# Patient Record
Sex: Male | Born: 1950 | Race: Black or African American | Hispanic: No | Smoking: Current every day smoker
Health system: Southern US, Community
[De-identification: ages and names within clinical notes are randomized; demographics above are authoritative.]

## PROBLEM LIST (undated history)

## (undated) DIAGNOSIS — B182 Chronic viral hepatitis C: Secondary | ICD-10-CM

## (undated) DIAGNOSIS — B20 Human immunodeficiency virus [HIV] disease: Secondary | ICD-10-CM

## (undated) DIAGNOSIS — J449 Chronic obstructive pulmonary disease, unspecified: Secondary | ICD-10-CM

## (undated) DIAGNOSIS — I10 Essential (primary) hypertension: Secondary | ICD-10-CM

## (undated) DIAGNOSIS — J45909 Unspecified asthma, uncomplicated: Secondary | ICD-10-CM

---

## 2002-01-07 ENCOUNTER — Other Ambulatory Visit: Admission: RE | Admit: 2002-01-07 | Discharge: 2002-01-07 | Payer: Self-pay | Admitting: Infectious Diseases

## 2002-07-03 ENCOUNTER — Other Ambulatory Visit: Admission: RE | Admit: 2002-07-03 | Discharge: 2002-07-03 | Payer: Self-pay | Admitting: Internal Medicine

## 2005-06-01 ENCOUNTER — Ambulatory Visit: Payer: Self-pay | Admitting: Internal Medicine

## 2006-02-01 ENCOUNTER — Ambulatory Visit: Payer: Self-pay | Admitting: Infectious Diseases

## 2006-05-02 ENCOUNTER — Ambulatory Visit: Payer: Self-pay | Admitting: Internal Medicine

## 2007-10-16 ENCOUNTER — Ambulatory Visit: Payer: Self-pay | Admitting: Infectious Diseases

## 2008-01-01 ENCOUNTER — Ambulatory Visit: Payer: Self-pay | Admitting: Infectious Diseases

## 2008-04-01 ENCOUNTER — Ambulatory Visit: Payer: Self-pay | Admitting: Internal Medicine

## 2008-10-21 ENCOUNTER — Ambulatory Visit: Payer: Self-pay | Admitting: Internal Medicine

## 2009-02-17 ENCOUNTER — Ambulatory Visit: Payer: Self-pay | Admitting: Infectious Diseases

## 2009-05-05 ENCOUNTER — Ambulatory Visit: Payer: Self-pay | Admitting: Infectious Diseases

## 2009-09-01 ENCOUNTER — Ambulatory Visit: Payer: Self-pay | Admitting: Infectious Disease

## 2009-10-20 ENCOUNTER — Ambulatory Visit: Payer: Self-pay | Admitting: Internal Medicine

## 2010-02-02 ENCOUNTER — Ambulatory Visit: Payer: Self-pay | Admitting: Infectious Diseases

## 2010-05-04 ENCOUNTER — Ambulatory Visit: Payer: Self-pay | Admitting: Infectious Disease

## 2011-02-08 DIAGNOSIS — B20 Human immunodeficiency virus [HIV] disease: Secondary | ICD-10-CM

## 2011-02-08 DIAGNOSIS — K089 Disorder of teeth and supporting structures, unspecified: Secondary | ICD-10-CM

## 2011-02-08 DIAGNOSIS — F17200 Nicotine dependence, unspecified, uncomplicated: Secondary | ICD-10-CM

## 2011-02-08 DIAGNOSIS — J449 Chronic obstructive pulmonary disease, unspecified: Secondary | ICD-10-CM

## 2011-07-26 DIAGNOSIS — B20 Human immunodeficiency virus [HIV] disease: Secondary | ICD-10-CM

## 2011-12-18 DIAGNOSIS — B20 Human immunodeficiency virus [HIV] disease: Secondary | ICD-10-CM

## 2012-04-21 DIAGNOSIS — B20 Human immunodeficiency virus [HIV] disease: Secondary | ICD-10-CM

## 2012-06-19 DIAGNOSIS — B20 Human immunodeficiency virus [HIV] disease: Secondary | ICD-10-CM

## 2012-09-10 DIAGNOSIS — B20 Human immunodeficiency virus [HIV] disease: Secondary | ICD-10-CM

## 2013-03-06 DIAGNOSIS — B20 Human immunodeficiency virus [HIV] disease: Secondary | ICD-10-CM

## 2013-06-16 DIAGNOSIS — B20 Human immunodeficiency virus [HIV] disease: Secondary | ICD-10-CM

## 2014-10-13 DIAGNOSIS — B20 Human immunodeficiency virus [HIV] disease: Secondary | ICD-10-CM

## 2014-11-05 DIAGNOSIS — B20 Human immunodeficiency virus [HIV] disease: Secondary | ICD-10-CM

## 2016-08-31 ENCOUNTER — Encounter: Payer: Self-pay | Admitting: Psychiatry

## 2016-08-31 ENCOUNTER — Inpatient Hospital Stay
Admission: EM | Admit: 2016-08-31 | Discharge: 2016-09-04 | DRG: 885 | Disposition: A | Discharge status: Home / Self Care | Payer: Medicare Other | Source: Intra-hospital | Attending: Psychiatry | Admitting: Psychiatry

## 2016-08-31 DIAGNOSIS — Z91128 Patient's intentional underdosing of medication regimen for other reason: Secondary | ICD-10-CM

## 2016-08-31 DIAGNOSIS — Z9119 Patient's noncompliance with other medical treatment and regimen: Secondary | ICD-10-CM | POA: Diagnosis not present

## 2016-08-31 DIAGNOSIS — K649 Unspecified hemorrhoids: Secondary | ICD-10-CM | POA: Diagnosis present

## 2016-08-31 DIAGNOSIS — R45851 Suicidal ideations: Secondary | ICD-10-CM

## 2016-08-31 DIAGNOSIS — G4700 Insomnia, unspecified: Secondary | ICD-10-CM | POA: Diagnosis present

## 2016-08-31 DIAGNOSIS — F17210 Nicotine dependence, cigarettes, uncomplicated: Secondary | ICD-10-CM | POA: Diagnosis present

## 2016-08-31 DIAGNOSIS — Z888 Allergy status to other drugs, medicaments and biological substances status: Secondary | ICD-10-CM | POA: Diagnosis not present

## 2016-08-31 DIAGNOSIS — J449 Chronic obstructive pulmonary disease, unspecified: Secondary | ICD-10-CM | POA: Diagnosis present

## 2016-08-31 DIAGNOSIS — B20 Human immunodeficiency virus [HIV] disease: Secondary | ICD-10-CM | POA: Diagnosis present

## 2016-08-31 DIAGNOSIS — I10 Essential (primary) hypertension: Secondary | ICD-10-CM | POA: Diagnosis present

## 2016-08-31 DIAGNOSIS — F332 Major depressive disorder, recurrent severe without psychotic features: Principal | ICD-10-CM | POA: Diagnosis present

## 2016-08-31 DIAGNOSIS — R4587 Impulsiveness: Secondary | ICD-10-CM | POA: Diagnosis present

## 2016-08-31 DIAGNOSIS — R634 Abnormal weight loss: Secondary | ICD-10-CM | POA: Diagnosis present

## 2016-08-31 DIAGNOSIS — Z653 Problems related to other legal circumstances: Secondary | ICD-10-CM

## 2016-08-31 DIAGNOSIS — B1910 Unspecified viral hepatitis B without hepatic coma: Secondary | ICD-10-CM | POA: Diagnosis present

## 2016-08-31 DIAGNOSIS — F17200 Nicotine dependence, unspecified, uncomplicated: Secondary | ICD-10-CM | POA: Diagnosis present

## 2016-08-31 DIAGNOSIS — Z21 Asymptomatic human immunodeficiency virus [HIV] infection status: Secondary | ICD-10-CM | POA: Diagnosis present

## 2016-08-31 DIAGNOSIS — F1420 Cocaine dependence, uncomplicated: Secondary | ICD-10-CM | POA: Diagnosis present

## 2016-08-31 DIAGNOSIS — B1920 Unspecified viral hepatitis C without hepatic coma: Secondary | ICD-10-CM

## 2016-08-31 DIAGNOSIS — B182 Chronic viral hepatitis C: Secondary | ICD-10-CM | POA: Diagnosis present

## 2016-08-31 DIAGNOSIS — Z91199 Patient's noncompliance with other medical treatment and regimen due to unspecified reason: Secondary | ICD-10-CM

## 2016-08-31 DIAGNOSIS — F1020 Alcohol dependence, uncomplicated: Secondary | ICD-10-CM | POA: Diagnosis present

## 2016-08-31 DIAGNOSIS — Z882 Allergy status to sulfonamides status: Secondary | ICD-10-CM | POA: Diagnosis not present

## 2016-08-31 HISTORY — DX: Chronic obstructive pulmonary disease, unspecified: J44.9

## 2016-08-31 HISTORY — DX: Human immunodeficiency virus (HIV) disease: B20

## 2016-08-31 HISTORY — DX: Chronic viral hepatitis C: B18.2

## 2016-08-31 HISTORY — DX: Essential (primary) hypertension: I10

## 2016-08-31 HISTORY — DX: Unspecified asthma, uncomplicated: J45.909

## 2016-08-31 MED ORDER — ALUM & MAG HYDROXIDE-SIMETH 200-200-20 MG/5ML PO SUSP: 30.0000 mL | ORAL | Status: DC | PRN

## 2016-08-31 MED ORDER — IBUPROFEN 600 MG PO TABS: 600.0000 mg | ORAL_TABLET | Freq: Four times a day (QID) | ORAL | Status: DC | PRN

## 2016-08-31 MED ORDER — ACETAMINOPHEN 325 MG PO TABS: 650.0000 mg | ORAL_TABLET | Freq: Four times a day (QID) | ORAL | Status: DC | PRN

## 2016-08-31 MED ORDER — HYDROXYZINE HCL 25 MG PO TABS: 25.0000 mg | ORAL_TABLET | Freq: Three times a day (TID) | ORAL | Status: DC | PRN

## 2016-08-31 MED ORDER — MAGNESIUM HYDROXIDE 400 MG/5ML PO SUSP: 30.0000 mL | Freq: Every day | ORAL | Status: DC | PRN

## 2016-08-31 MED ORDER — TRAZODONE HCL 100 MG PO TABS
100.0000 mg | ORAL_TABLET | Freq: Every day | ORAL | Status: DC
  Administered 2016-08-31: 100 mg via ORAL
  Filled 2016-08-31: qty 1

## 2016-08-31 MED ORDER — NICOTINE 21 MG/24HR TD PT24: 21.0000 mg | MEDICATED_PATCH | Freq: Every day | TRANSDERMAL | Status: DC

## 2016-08-31 MED ORDER — ALBUTEROL SULFATE HFA 108 (90 BASE) MCG/ACT IN AERS
1.0000 | INHALATION_SPRAY | Freq: Four times a day (QID) | RESPIRATORY_TRACT | Status: DC | PRN
  Filled 2016-08-31: qty 6.7

## 2016-08-31 MED ORDER — HYDROCORTISONE ACETATE 25 MG RE SUPP
25.0000 mg | Freq: Two times a day (BID) | RECTAL | Status: DC
  Administered 2016-09-02: 25 mg via RECTAL
  Filled 2016-08-31 (×3): qty 1

## 2016-08-31 NOTE — Tx Team (Signed)
Initial Treatment Plan 08/31/2016 4:11 PM Henry BorosJulius B Oconnell BJY:[id number]    PATIENT STRESSORS: Financial difficulties Health problems Medication change or noncompliance   PATIENT STRENGTHS: Capable of independent living Supportive family/friends   PATIENT IDENTIFIED PROBLEMS: Depression       Non compliant with meds for medical condition                DISCHARGE CRITERIA:  Ability to meet basic life and health needs Improved stabilization in mood, thinking, and/or behavior Need for constant or close observation no longer present  PRELIMINARY DISCHARGE PLAN: Attend aftercare/continuing care group Return to previous living arrangement  PATIENT/FAMILY INVOLVEMENT: This treatment plan has been presented to and reviewed with the patient, Henry Oconnell, and/or family member, .  The patient and family have been given the opportunity to ask questions and make suggestions.  Ignacia FellingJennifer A Nyron Mozer, RN 08/31/2016, 4:11 PM

## 2016-08-31 NOTE — BHH Group Notes (Signed)
B[facility] Group Notes:  (Nursing/MHT/Case Management/Adjunct)  Date:  08/31/2016  Time:  5:06 PM  Type of Therapy:  Group Therapy  Participation Level:  Did Not Attend   Annella Prowell De'Chelle Yehia Mcbain 08/31/2016, 5:06 PM 

## 2016-08-31 NOTE — BH Assessment (Signed)
Patient has been [facility].  Accepting physician is Dr. Jennet MaduroPucilowska.  Attending Physician will be Dr. Jennet MaduroPucilowska.  Patient has been [facility] to room 303, [facility] Charge Nurse [place] .  Call report to [phone].  Representative/Transfer Coordinator is Warden/rangerCalvin Patient pre-admitted [facility] Patient Access Byrd Hesselbach(Maria)   [facility] Staff [facility](Meghan, TTS) made aware of acceptance.

## 2016-08-31 NOTE — BH Assessment (Signed)
Writer contacted [facility](Lisa Hampton-[phone]) about the medications that transferred with patient, in error. They are sending their carrier service, [employer] to pick them up.
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
## 2016-09-01 DIAGNOSIS — F1420 Cocaine dependence, uncomplicated: Secondary | ICD-10-CM

## 2016-09-01 LAB — COMPREHENSIVE METABOLIC PANEL WITH GFR
ALT: 51 U/L (ref 17–63)
AST: 77 U/L — ABNORMAL HIGH (ref 15–41)
Albumin: 3.1 g/dL — ABNORMAL LOW (ref 3.5–5.0)
Alkaline Phosphatase: 63 U/L (ref 38–126)
Anion gap: 4 — ABNORMAL LOW (ref 5–15)
BUN: 9 mg/dL (ref 6–20)
CO2: 29 mmol/L (ref 22–32)
Calcium: 8.6 mg/dL — ABNORMAL LOW (ref 8.9–10.3)
Chloride: 105 mmol/L (ref 101–111)
Creatinine, Ser: 0.91 mg/dL (ref 0.61–1.24)
GFR calc Af Amer: >60 mL/min
GFR calc non Af Amer: >60 mL/min
Glucose, Bld: 90 mg/dL (ref 65–99)
Potassium: 3.8 mmol/L (ref 3.5–5.1)
Sodium: 138 mmol/L (ref 135–145)
Total Bilirubin: 0.7 mg/dL (ref 0.3–1.2)
Total Protein: 8.1 g/dL (ref 6.5–8.1)

## 2016-09-01 LAB — CBC
HCT: 36.1 % — ABNORMAL LOW (ref 40.0–52.0)
Hemoglobin: 12.5 g/dL — ABNORMAL LOW (ref 13.0–18.0)
MCH: 33.1 pg (ref 26.0–34.0)
MCHC: 34.7 g/dL (ref 32.0–36.0)
MCV: 95.5 fL (ref 80.0–100.0)
Platelets: 104 K/uL — ABNORMAL LOW (ref 150–440)
RBC: 3.78 MIL/uL — ABNORMAL LOW (ref 4.40–5.90)
RDW: 16.1 % — ABNORMAL HIGH (ref 11.5–14.5)
WBC: 2.8 K/uL — ABNORMAL LOW (ref 3.8–10.6)

## 2016-09-01 LAB — LIPID PANEL
Cholesterol: 175 mg/dL (ref 0–200)
HDL: 40 mg/dL — ABNORMAL LOW
LDL Cholesterol: 120 mg/dL — ABNORMAL HIGH (ref 0–99)
Total CHOL/HDL Ratio: 4.4 ratio
Triglycerides: 73 mg/dL
VLDL: 15 mg/dL (ref 0–40)

## 2016-09-01 LAB — TSH: TSH: 1.208 u[IU]/mL (ref 0.350–4.500)

## 2016-09-01 MED ORDER — SERTRALINE HCL 25 MG PO TABS
25.0000 mg | ORAL_TABLET | Freq: Every day | ORAL | Status: DC
  Administered 2016-09-01 – 2016-09-03 (×3): 25 mg via ORAL
  Filled 2016-09-01 (×3): qty 1

## 2016-09-01 MED ORDER — ADULT MULTIVITAMIN W/MINERALS CH
1.0000 | ORAL_TABLET | Freq: Every day | ORAL | Status: DC
  Administered 2016-09-01 – 2016-09-04 (×4): 1 via ORAL
  Filled 2016-09-01 (×4): qty 1

## 2016-09-01 MED ORDER — ADULT MULTIVITAMIN W/MINERALS CH: 1.0000 | ORAL_TABLET | Freq: Every day | ORAL | Status: DC

## 2016-09-01 MED ORDER — MIRTAZAPINE 15 MG PO TABS
15.0000 mg | ORAL_TABLET | Freq: Every day | ORAL | Status: DC
  Administered 2016-09-01 – 2016-09-03 (×3): 15 mg via ORAL
  Filled 2016-09-01 (×3): qty 1

## 2016-09-01 MED ORDER — DRONABINOL 2.5 MG PO CAPS
2.5000 mg | ORAL_CAPSULE | Freq: Every day | ORAL | Status: DC
  Administered 2016-09-01 – 2016-09-03 (×3): 2.5 mg via ORAL
  Filled 2016-09-01 (×3): qty 1

## 2016-09-01 MED ORDER — ELVITEG-COBIC-EMTRICIT-TENOFAF 150-150-200-10 MG PO TABS
1.0000 | ORAL_TABLET | Freq: Every day | ORAL | Status: DC
  Administered 2016-09-01 – 2016-09-04 (×4): 1 via ORAL
  Filled 2016-09-01 (×4): qty 1

## 2016-09-01 MED ORDER — ENSURE ENLIVE PO LIQD
237.0000 mL | Freq: Three times a day (TID) | ORAL | Status: DC
  Administered 2016-09-01 – 2016-09-04 (×10): 237 mL via ORAL

## 2016-09-01 NOTE — Progress Notes (Signed)
Pt denies SI/HI/AVH at this time. Affect depressed. Minimal interaction with staff or peers. Denies pain. Voices no additional concerns at this time. Safety maintained. Will continue to monitor.

## 2016-09-01 NOTE — Plan of Care (Signed)
Problem: Activity: Goal: Interest or engagement in activities will improve Outcome: Not Progressing Minimal interaction with staff or peers.

## 2016-09-01 NOTE — H&P (Signed)
Psychiatric Admission Assessment Adult  Patient Identification: Henry Oconnell MRN:  [id number] Date of Evaluation:  09/01/2016 Chief Complaint:  Depression Principal Diagnosis: Major depressive disorder, recurrent severe without psychotic features ([place]) Diagnosis:   Patient Active Problem List   Diagnosis Date Noted  . Cocaine use disorder, moderate, dependence ([place]) [F14.20] 09/01/2016  . Major depressive disorder, recurrent severe without psychotic features ([place]) [F33.2] 08/31/2016  . Alcohol use disorder, moderate, dependence ([place]) [F10.20] 08/31/2016  . Suicidal ideation [R45.851] 08/31/2016  . Noncompliance [Z91.19] 08/31/2016  . Tobacco use disorder [F17.200] 08/31/2016  . Human immunodeficiency virus (HIV) disease ([place]) [B20] 08/31/2016  . Hepatitis C [B19.20] 08/31/2016  . Hepatitis B [B19.10] 08/31/2016   History of Present Illness:  Patient is a 65 year old single African-American male from [place]. He was transferred from [facility] emergency department to our unit for further stabilization and treatment of worsening depression.  Patient states that he went voluntarily to [facility] emergency department on December 7 requesting help for suicidal ideation. States that he was having thoughts about killing himself anyway he could. Patient says that his main stressor is his health condition. Patient states he's been diagnosed with HIV since the 90s. He says that for the last couple weeks he has not been taking medications because he just didn't feel like it.  Patient also has legal stressors. He was just released from prison in August. He is currently on probation.  Patient described his mood as irritable, denies problems with sleep, appetite, energy or concentration. He denies any hallucinations or homicidal ideation.  As far as substance abuse patient says he relapsed on cocaine/crack about a week ago. He had a past history of cocaine but had been sober since  August. He also has a history of alcohol dependence. Says his only been drinking about 4 beers per week. Patient says that while in prison he completed the DART program. Patient smokes about one pack of cigarettes per week.    Associated Signs/Symptoms: Depression Symptoms:  depressed mood, hopelessness, recurrent thoughts of death, suicidal thoughts without plan, (Hypo) Manic Symptoms:  Impulsivity, Anxiety Symptoms:  Excessive Worry, Psychotic Symptoms:  denies PTSD Symptoms: Negative Total Time spent with patient: 1 hour  Past Psychiatric History: [place] and [place]icidality. He denies having any prior suicidal attempts but says that in the past he has tried to drink himself today and overdosed on cocaine.  Is the patient at risk to self? Yes.    Has the patient been a risk to self in the past 6 months? No.  Has the patient been a risk to self within the distant past? No.  Is the patient a risk to others? No.  Has the patient been a risk to others in the past 6 months? No.  Has the patient been a risk to others within the distant past? No.   Alcohol Screening: 1. How often do you have a drink containing alcohol?: Monthly or less 2. How many drinks containing alcohol do you have on a typical day when you are drinking?: 3 or 4 3. How often do you have six or more drinks on one occasion?: Less than monthly Preliminary Score: 2 4. How often during the last year have you found that you were not able to stop drinking once you had started?: Never 5. How often during the last year have you failed to do what was normally expected from  you becasue of drinking?: Never 6. How often during the last year have you needed a first drink in the morning to get yourself going after a heavy drinking session?: Never 7. How often during the last year have you had a feeling of guilt of remorse after  drinking?: Never 8. How often during the last year have you been unable to remember what happened the night before because you had been drinking?: Never 9. Have you or someone else been injured as a result of your drinking?: No 10. Has a relative or friend or a doctor or another health worker been concerned about your drinking or suggested you cut down?: Yes, but not in the last year Alcohol Use Disorder Identification Test Final Score (AUDIT): 5 Brief Intervention: AUDIT score less than 7 or less-screening does not suggest unhealthy drinking-brief intervention not indicated  Past Medical History: Patient sees an infectious disease doctor in [facility] for his HIV. He denies any history of head trauma or seizures  Past Medical History:  Diagnosis Date  . Asthma   . COPD (chronic obstructive pulmonary disease) ([place])   . Hep C w/ coma, chronic ([place])   . HIV (human immunodeficiency virus infection) ([place])   . Hypertension    History reviewed. No pertinent surgical history.  Family History: History reviewed. No pertinent family history.  Family Psychiatric  History: He denies any family history of mental illness, substance abuse or suicide  Tobacco Screening: Have you used any form of tobacco in the last 30 days? (Cigarettes, Smokeless Tobacco, Cigars, and/or Pipes): Yes Tobacco use, Select all that apply: 4 or less cigarettes per day Are you interested in Tobacco Cessation Medications?: Yes, will notify MD for an order Counseled patient on smoking cessation including recognizing danger situations, developing coping skills and basic information about quitting provided: Refused/Declined practical counseling  Social History: Patient is currently living with his brother in [place]. He is single, never married, has 2 children ages  As far as his legal history he had charges for breaking and entering. He violated probation and went to prison for 15 months. He was just released this past  August. He continues to be on probation  History  Alcohol Use  . 2.4 oz/week  . 4 Cans of beer per week     History  Drug Use  . Types: Cocaine     Allergies:   Allergies  Allergen Reactions  . Sulfamethoxazole Micro [Sulfamethoxazole] Rash  . Trimethoprim Rash   Lab Results:  Results for orders placed or performed during the hospital encounter of 08/31/16 (from the past 48 hour(s))  CBC     Status: Abnormal   Collection Time: 09/01/16  7:25 AM  Result Value Ref Range   WBC 2.8 (L) 3.8 - 10.6 K/uL   RBC 3.78 (L) 4.40 - 5.90 MIL/uL   Hemoglobin 12.5 (L) 13.0 - 18.0 g/dL   HCT 36.1 (L) 40.0 - 52.0 %   MCV 95.5 80.0 - 100.0 fL   MCH 33.1 26.0 - 34.0 pg   MCHC 34.7 32.0 - 36.0 g/dL   RDW 16.1 (H) 11.5 - 14.5 %   Platelets 104 (L) 150 - 440 K/uL  Comprehensive metabolic panel     Status: Abnormal   Collection Time: 09/01/16  7:25 AM  Result Value Ref Range   Sodium 138 135 - 145 mmol/L   Potassium 3.8 3.5 - 5.1 mmol/L   Chloride 105 101 - 111 mmol/L   CO2 29 22 - 32  mmol/L   Glucose, Bld 90 65 - 99 mg/dL   BUN 9 6 - 20 mg/dL   Creatinine, Ser 0.91 0.61 - 1.24 mg/dL   Calcium 8.6 (L) 8.9 - 10.3 mg/dL   Total Protein 8.1 6.5 - 8.1 g/dL   Albumin 3.1 (L) 3.5 - 5.0 g/dL   AST 77 (H) 15 - 41 U/L   ALT 51 17 - 63 U/L   Alkaline Phosphatase 63 38 - 126 U/L   Total Bilirubin 0.7 0.3 - 1.2 mg/dL   GFR calc non Af Amer >60 >60 mL/min   GFR calc Af Amer >60 >60 mL/min    Comment: (NOTE) The eGFR has been calculated using the CKD EPI equation. This calculation has not been validated in all clinical situations. eGFR's persistently <60 mL/min signify possible Chronic Kidney Disease.    Anion gap 4 (L) 5 - 15  TSH     Status: None   Collection Time: 09/01/16  7:25 AM  Result Value Ref Range   TSH 1.208 0.350 - 4.500 uIU/mL    Comment: Performed by a 3rd Generation assay with a functional sensitivity of <=0.01 uIU/mL.  Lipid panel     Status: Abnormal   Collection Time:  09/01/16  7:25 AM  Result Value Ref Range   Cholesterol 175 0 - 200 mg/dL   Triglycerides 73 <150 mg/dL   HDL 40 (L) >40 mg/dL   Total CHOL/HDL Ratio 4.4 RATIO   VLDL 15 0 - 40 mg/dL   LDL Cholesterol 120 (H) 0 - 99 mg/dL    Comment:        Total Cholesterol/HDL:CHD Risk Coronary Heart Disease Risk Table                     Men   Women  1/2 Average Risk   3.4   3.3  Average Risk       5.0   4.4  2 X Average Risk   9.6   7.1  3 X Average Risk  23.4   11.0        Use the calculated Patient Ratio above and the CHD Risk Table to determine the patient's CHD Risk.        ATP III CLASSIFICATION (LDL):  <100     mg/dL   Optimal  100-129  mg/dL   Near or Above                    Optimal  130-159  mg/dL   Borderline  160-189  mg/dL   High  >190     mg/dL   Very High     Blood Alcohol level:  No results found for: [facility]  Metabolic Disorder Labs:  No results found for: HGBA1C, MPG No results found for: PROLACTIN Lab Results  Component Value Date   CHOL 175 09/01/2016   TRIG 73 09/01/2016   HDL 40 (L) 09/01/2016   CHOLHDL 4.4 09/01/2016   VLDL 15 09/01/2016   LDLCALC 120 (H) 09/01/2016    Current Medications: Current Facility-Administered Medications  Medication Dose Route Frequency Provider Last Rate Last Dose  . acetaminophen (TYLENOL) tablet 650 mg  650 mg Oral Q6H PRN Jolanta B Pucilowska, MD      . albuterol (PROVENTIL HFA;VENTOLIN HFA) 108 (90 Base) MCG/ACT inhaler 1-2 puff  1-2 puff Inhalation Q6H PRN Jolanta B Pucilowska, MD      . alum & mag hydroxide-simeth (MAALOX/MYLANTA) 200-200-20 MG/5ML suspension 30 mL  30  mL Oral Q4H PRN Clovis Fredrickson, MD      . hydrocortisone (ANUSOL-HC) suppository 25 mg  25 mg Rectal BID Jolanta B Pucilowska, MD      . hydrOXYzine (ATARAX/VISTARIL) tablet 25 mg  25 mg Oral TID PRN Jolanta B Pucilowska, MD      . ibuprofen (ADVIL,MOTRIN) tablet 600 mg  600 mg Oral Q6H PRN Jolanta B Pucilowska, MD      . magnesium hydroxide (MILK OF  MAGNESIA) suspension 30 mL  30 mL Oral Daily PRN Jolanta B Pucilowska, MD      . nicotine (NICODERM CQ - dosed in mg/24 hours) patch 21 mg  21 mg Transdermal Q0600 Jolanta B Pucilowska, MD      . sertraline (ZOLOFT) tablet 25 mg  25 mg Oral Daily Hildred Priest, MD      . traZODone (DESYREL) tablet 100 mg  100 mg Oral QHS Jolanta B Pucilowska, MD   100 mg at 08/31/16 2150   PTA Medications: No prescriptions prior to admission.    Musculoskeletal: Strength & Muscle Tone: within normal limits Gait & Station: normal Patient leans: N/A  Psychiatric Specialty Exam: Physical Exam  Constitutional: He is oriented to person, place, and time. He appears well-developed and well-nourished.  HENT:  Head: Normocephalic and atraumatic.  Eyes: Conjunctivae and EOM are normal.  Neck: Normal range of motion.  Respiratory: Effort normal.  Musculoskeletal: Normal range of motion.  Neurological: He is alert and oriented to person, place, and time.    Review of Systems  Constitutional: Negative.   HENT: Negative.   Eyes: Negative.   Respiratory: Negative.   Cardiovascular: Negative.   Gastrointestinal: Negative.   Genitourinary: Negative.   Musculoskeletal: Negative.   Skin: Negative.   Neurological: Negative.   Endo/Heme/Allergies: Negative.   Psychiatric/Behavioral: Positive for depression, substance abuse and suicidal ideas. Negative for hallucinations and memory loss. The patient is not nervous/anxious and does not have insomnia.     Blood pressure (!) 100/56, pulse 62, temperature 99 F (37.2 C), temperature source Oral, resp. rate 17, height '5\' 9"'$  (1.753 m), weight 54.4 kg (120 lb), SpO2 100 %.Body mass index is 17.72 kg/m.  General Appearance: Disheveled  Eye Contact:  Fair  Speech:  Clear and Coherent  Volume:  Decreased  Mood:  Dysphoric  Affect:  Blunt  Thought Process:  Linear and Descriptions of Associations: Intact  Orientation:  Full (Time, Place, and Person)   Thought Content:  Hallucinations: None  Suicidal Thoughts:  Yes.  with intent/plan  Homicidal Thoughts:  No  Memory:  Immediate;   Good Recent;   Good Remote;   Good  Judgement:  Poor  Insight:  Shallow  Psychomotor Activity:  Decreased  Concentration:  Concentration: Good and Attention Span: Good  Recall:  Good  Fund of Knowledge:  Good  Language:  Good  Akathisia:  No  Handed:    AIMS (if indicated):     Assets:  Communication Skills Physical Health  ADL's:  Intact  Cognition:  WNL  Sleep:  Number of Hours: 6.45    Treatment Plan Summary: Daily contact with patient to assess and evaluate symptoms and progress in treatment and Medication management  For major depressive disorder the patient will be started on Zoloft 25 mg by mouth daily.  For insomnia I will order mirtazapine 15 mg qhs  For tobacco use disorder the patient declines from receiving a nicotine patch  For cocaine and alcohol use disorder patient will be referred to outpatient  substance abuse treatment upon discharge. There is no evidence of withdrawal at this time.  For HIV he will be continued on Genvoya  Poor appetite: ensure tid, will start on marinol and multivitamins   Labs: Ast 15, alcohol was 120,  + cocaine, RPR neg, EKG sinus brady 58.  UA clear  Dispo: back [place] with brother  F/u: will need psych f/u  Physician Treatment Plan for Primary Diagnosis: Major depressive disorder, recurrent severe without psychotic features ([place]) Long Term Goal(s): Improvement in symptoms so as ready for discharge  Short Term Goals: Ability to identify changes in lifestyle to reduce recurrence of condition will improve, Ability to verbalize feelings will improve, Ability to disclose and discuss suicidal ideas, Ability to demonstrate self-control will improve, Ability to identify and develop effective coping behaviors will improve, Compliance with prescribed medications will improve and Ability to identify  triggers associated with substance abuse/mental health issues will improve  Physician Treatment Plan for Secondary Diagnosis: Principal Problem:   Major depressive disorder, recurrent severe without psychotic features ([place]) Active Problems:   Alcohol use disorder, moderate, dependence ([place])   Suicidal ideation   Noncompliance   Tobacco use disorder   Human immunodeficiency virus (HIV) disease ([place])   Hepatitis C   Hepatitis B   Cocaine use disorder, moderate, dependence ([place])  Long Term Goal(s): Improvement in symptoms so as ready for discharge  Short Term Goals: Ability to identify and develop effective coping behaviors will improve, Compliance with prescribed medications will improve and Ability to identify triggers associated with substance abuse/mental health issues will improve  I certify that inpatient services furnished can reasonably be expected to improve the patient's condition.    Hildred Priest, MD 12/9/201710:06 AM

## 2016-09-01 NOTE — BHH Suicide Risk Assessment (Signed)
[facility]   Nursing information obtained from:    Demographic factors:    Current Mental Status:    Loss Factors:    Historical Factors:    Risk Reduction Factors:     Total Time spent with patient: 1 hour Principal Problem: Major depressive disorder, recurrent severe without psychotic features (H[facility]) Diagnosis:   Patient Active Problem List   Diagnosis Date Noted  . Cocaine use disorder, moderate, dependence (HCC) [F14.20] 09/01/2016  . Major depressive disorder, recurrent severe without psychotic features ([facility]) [F33.2] 08/31/2016  . Alcohol use disorder, moderate, dependence (H[facility]) [F10.20] 08/31/2016  . Suicidal ideation [R45.851] 08/31/2016  . Noncompliance [Z91.19] 08/31/2016  . Tobacco use disorder [F17.200] 08/31/2016  . Human immunodeficiency virus (HIV) disease (H[facility]) [B20] 08/31/2016  . Hepatitis C [B19.20] 08/31/2016  . Hepatitis B [B19.10] 08/31/2016   Subjective Data:   Continued Clinical Symptoms:  Alcohol Use Disorder Identification Test Final Score (AUDIT): 5 The "Alcohol Use Disorders Identification Test", Guidelines for Use in Primary Care, Second Edition.  World Science writerHealth Organization [facility](WHO). Score between 0-7:  no or low risk or alcohol related problems. Score between 8-15:  moderate risk of alcohol related problems. Score between 16-19:  high risk of alcohol related problems. Score 20 or above:  warrants further diagnostic evaluation for alcohol dependence and treatment.   CLINICAL FACTORS:   Depression:   Comorbid alcohol abuse/dependence Hopelessness Impulsivity Severe Alcohol/Substance Abuse/Dependencies Previous Psychiatric Diagnoses and Treatments Medical Diagnoses and Treatments/Surgeries     Psychiatric Specialty Exam: Physical Exam  ROS  Blood pressure (!) 100/56, pulse 62, temperature 99 F (37.2 C), temperature source Oral, resp. rate 17, height 5\' 9"  (1.753 m), weight 54.4 kg (120 lb), SpO2 100 %.Body mass index  is 17.72 kg/m.                                                    Sleep:  Number of Hours: 6.45      COGNITIVE FEATURES THAT CONTRIBUTE TO RISK:  Closed-mindedness    SUICIDE RISK:   Moderate:  Frequent suicidal ideation with limited intensity, and duration, some specificity in terms of plans, no associated intent, good self-control, limited dysphoria/symptomatology, some risk factors present, and identifiable protective factors, including available and accessible social support.   [facility]  I certify that inpatient services furnished can reasonably be expected to improve the patient's condition.  Jimmy FootmanHernandez-Gonzalez,  Ashonte Angelucci, MD 09/01/2016, 10:05 AM

## 2016-09-01 NOTE — [facility] Group Notes (Signed)
[facility] LCSW Group Therapy  09/01/2016 2:10 PM  Type of Therapy:  Group Therapy  Participation Level:  Patient did not attend group. CSW invited patient to group.   Summary of Progress/Problems: Stress management: Patients defined and discussed the topic of stress and the related symptoms and triggers for stress. Patients identified healthy coping skills they would like to try during hospitalization and after discharge to manage stress in a healthy way. CSW offered insight to varying stress management techniques.   Kelaiah Escalona G. Garnette CzechSampson MSW, LCSWA 09/01/2016, 2:11 PM

## 2016-09-01 NOTE — [facility] Counselor (Signed)
Adult Comprehensive Assessment  Patient ID: Henry BorosJulius B Bryngelson, male   DOB: 12/20/1950, 65 y.o.   MRN: [id number]  Information Source: Information source: Patient  Current Stressors:  Educational / Learning stressors: n/a Employment / Job issues: Pt is on disability Family Relationships: n/a Surveyor, quantityinancial / Lack of resources (include bankruptcy): n/a Housing / Lack of housing: n/a Physical health (include injuries & life threatening diseases): Hepatitis B &C, HIV Social relationships: n/a Substance abuse: Alcohol Bereavement / Loss: n/a  Living/Environment/Situation:  Living Arrangements: Other relatives How long has patient lived in current situation?: Since August 2017 What is atmosphere in current home: Comfortable, Supportive  Family History:  Marital status: Separated Separated, when?: Since 2006 What types of issues is patient dealing with in the relationship?: Pt did not want to answer Additional relationship information: n/a Are you sexually active?: No What is your sexual orientation?: heterosexual Has your sexual activity been affected by drugs, alcohol, medication, or emotional stress?: n/a Does patient have children?: Yes How many children?: 2 How is patient's relationship with their children?: 2 girls. Pt states he has a good relationship with them.  Childhood History:  By whom was/is the patient raised?: Both parents Additional childhood history information: n/a Description of patient's relationship with caregiver when they were a child: Pt states they had a good relationship Patient's description of current relationship with people who raised him/her: Both deceased How were you disciplined when you got in trouble as a child/adolescent?: n/a Does patient have siblings?: No Did patient suffer any verbal/emotional/physical/sexual abuse as a child?: No Did patient suffer from severe childhood neglect?: No Has patient ever been sexually abused/assaulted/raped as an  adolescent or adult?: No Was the patient ever a victim of a crime or a disaster?: No Witnessed domestic violence?: No Has patient been effected by domestic violence as an adult?: No  Education:  Highest grade of school patient has completed: 10th Currently a student?: No Learning disability?: No  Employment/Work Situation:   Employment situation: On disability Why is patient on disability: Medical reasons How long has patient been on disability: Since October 2017. 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 Has patient ever been in the [employer] and Companymilitary?: No Has patient ever served in combat?: No Did You Receive Any Psychiatric Treatment/Services While in Equities traderthe Military?: No Are There Guns or Education officer, communityther Weapons in Your Home?: No Are These ComptrollerWeapons Safely Secured?:  (n/a)  Surveyor, quantityinancial Resources:   Surveyor, quantityinancial resources: [employer]ceives SSI, Medicaid, Medicare Does patient have a Lawyerrepresentative payee or guardian?: No  Alcohol/Substance Abuse:   What has been your use of drugs/alcohol within the last 12 months?: Alcohol If attempted suicide, did drugs/alcohol play a role in this?: Yes Alcohol/Substance Abuse Treatment Hx: Attends AA/NA If yes, describe treatment: Pt states he went to [employer] meetings [place] Has alcohol/substance abuse ever caused legal problems?: No  Social Support System:   Conservation officer, natureatient's Community Support System: Good Describe Community Support System: Pt has support from brother and daughters Type of faith/religion: n/a How does patient's faith help to cope with current illness?: n/a  Leisure/Recreation:   Leisure and Hobbies: Engineer, waterWatching tv  Strengths/Needs:   What things does the patient do well?: playing cards, yard work, exercise In what areas does patient struggle / problems  for patient:  depression and alcohol use  Discharge Plan:   Does patient have access to transportation?: No Plan for no access to transportation at discharge: CSW will assess appropriate transportation Will patient be returning to same living situation after discharge?: Yes Currently receiving community mental health services: No If no, would patient like referral for services when discharged?:  [facility]([place] Does patient have financial barriers related to discharge medications?: No  Summary/Recommendations:   Patient is a 65 year old male admitted voluntarily with a diagnosis of Major depressive disorder, recurrent severe without psychotic features and alcohol use disorder, moderate, dependence. Information was obtained from psychosocial assessment completed with patient and chart review conducted by this evaluator. [facility] [place] [facility] [place] [facility] [place] worsening depression . Patient reports primary triggers for admission were having suicidal thoughts and stressors from his health condition. Patient [place] and plans to return there when discharged. Patient will follow-up with [facility] in [place] for outpatient services. Patient is on disability and has [employer]icaid/Medicare insurance. Patient will benefit from crisis stabilization, medication evaluation, group therapy and psycho education in addition to case management for discharge. At discharge, it is recommended that patient remain compliant with established discharge plan and continued treatment.   Keyleigh Manninen G. Garnette CzechSampson MSW, LCSWA 09/01/2016 11:49 AM

## 2016-09-02 LAB — HEMOGLOBIN A1C
Hgb A1c MFr Bld: 5.5 % (ref 4.8–5.6)
MEAN PLASMA GLUCOSE: 111 mg/dL

## 2016-09-02 NOTE — Progress Notes (Signed)
Initial Nutrition Assessment  DOCUMENTATION CODES:   Severe malnutrition in context of chronic illness  INTERVENTION:   Ensure Enlive po TID, each supplement provides 350 kcal and 20 grams of protein  Magic cup TID with meals, each supplement provides 290 kcal and 9 grams of protein  Multivitamin daily  NUTRITION DIAGNOSIS:   Malnutrition related to catabolic illness, poor appetite as evidenced by severe depletion of body fat, severe depletion of muscle mass.  GOAL:   Patient will meet greater than or equal to 90% of their needs  MONITOR:   PO intake, Supplement acceptance  REASON FOR ASSESSMENT:   Consult Assessment of nutrition requirement/status  ASSESSMENT:   65 year old single African-American male from [place]. He was transferred from [facility] emergency department to our unit for further stabilization and treatment of worsening depression. Pt has h/o HIV, alcohol and cocaine abuse, Hepatitis B, and Hepatitis C   Met with pt in room today. Pt reports improving appetite and eating 50-85% meals. Pt reports that his appetite varied pta and sometimes its good and sometimes its poor. Pt reports that he usually weighs ~135lbs but has lost weight over the past couple of months; this would be an 11% body weight loss over a couple of months and would be considered significant. There is no wt history in chart for this pt. Pt denies any N/V/D today. Pt reports that he is drinking 100% of his Ensures.    Medications reviewed and include: hydrocortisone, MVI, nicotine, mirtazapine, zoloft  Labs reviewed: Ca 8.6(L) adj. 9.32 wnl, Alb. 3.1(L), AST 77(H) WBC- 2.8(L)  Nutrition-Focused physical exam completed. Findings are severe fat depletion, severe muscle depletion, and no edema.   Diet Order:  Diet regular Room service appropriate? Yes; Fluid consistency: Thin  Skin:  Reviewed, no issues  Last BM:  none since admit  Height:   Ht Readings from Last 1  Encounters:  08/31/16 _0  (1.753 m)    Weight:   Wt Readings from Last 1 Encounters:  08/31/16 120 lb (54.4 kg)    Ideal Body Weight:  72.7 kg  BMI:  Body mass index is 17.72 kg/m.  Estimated Nutritional Needs:   Kcal:  1650-1900kcal/day   Protein:  82-92g/day   Fluid:  >1.5L/day   EDUCATION NEEDS:   No education needs identified at this time  Koleen Distance, RD, LDN

## 2016-09-02 NOTE — Progress Notes (Signed)
Pt denies SI/HI/AVH at this time. Affect depressed. Minimal interaction with staff or peers. Denies pain. Medication compliant. Appropriate during interaction.  Voices no additional concerns at this time. Safety maintained. Will continue to monitor

## 2016-09-02 NOTE — Plan of Care (Signed)
Problem: Activity: Goal: Imbalance in normal sleep/wake cycle will improve Outcome: Progressing Pt has been sleeping 6 or more hrs. Per night, uninterrupted.

## 2016-09-02 NOTE — Progress Notes (Signed)
[facility] MD Progress Note  09/02/2016 9:13 AM Henry Oconnell  MRN:  [id number] Subjective:  Patient is a 65 year old single African-American male from [place]. He was transferred from [facility] emergency department to our unit for further stabilization and treatment of worsening depression.  Patient states that he went voluntarily to [facility] emergency department on December 7 requesting help for suicidal ideation. States that he was having thoughts about killing himself anyway he could. Patient says that his main stressor is his health condition. Patient states he's been diagnosed with HIV since the 90s. He says that for the last couple weeks he has not been taking medications because he just didn't feel like it.  Patient also has legal stressors. He was just released from prison in August. He is currently on probation. Patient described his mood as irritable, denies problems with sleep, appetite, energy or concentration. He denies any hallucinations or homicidal ideation.  12/10 patient reports feeling much better this morning. He said he slept very well last night. His mood has improved and he is not longer having suicidal ideation. He feels that his appetite is still poor but he feels is improving. He denies having any side effects from medications, he denies having any physical complaints. Patient denies homicidal ideation and auditory or visual hallucinations.   Per nursing: Pt denies SI/HI/AVH at this time. Affect depressed. Minimal interaction with staff or peers. Denies pain. Medication compliant. Appropriate during interaction.  Voices no additional concerns at this time. Safety maintained. Will continue to monitor  Principal Problem: Major depressive disorder, recurrent severe without psychotic features ([place]) Diagnosis:   Patient Active Problem List   Diagnosis Date Noted  . Cocaine use disorder, moderate, dependence ([place]) [F14.20] 09/01/2016  . Major depressive disorder,  recurrent severe without psychotic features ([place]) [F33.2] 08/31/2016  . Alcohol use disorder, moderate, dependence ([place]) [F10.20] 08/31/2016  . Suicidal ideation [R45.851] 08/31/2016  . Noncompliance [Z91.19] 08/31/2016  . Tobacco use disorder [F17.200] 08/31/2016  . Human immunodeficiency virus (HIV) disease ([place]) [B20] 08/31/2016  . Hepatitis C [B19.20] 08/31/2016  . Hepatitis B [B19.10] 08/31/2016   Total Time spent with patient: 30 minutes  Past Psychiatric History: [place] and [place]icidality. He denies having any prior suicidal attempts but says that in the past he has tried to drink himself today and overdosed on cocaine.  Past Medical History:  Past Medical History:  Diagnosis Date  . Asthma   . COPD (chronic obstructive pulmonary disease) ([place])   . Hep C w/ coma, chronic ([place])   . HIV (human immunodeficiency virus infection) ([place])   . Hypertension    History reviewed. No pertinent surgical history.  Family History: History reviewed. No pertinent family history.  Family Psychiatric  History: He denies any family history of mental illness, substance abuse or suicide  Social History: Patient is currently living with his brother in [place]. He is single, never married, has 2 children ages  As far as his legal history he had charges for breaking and entering. He violated probation and went to prison for 15 months. He was just released this past August. He continues to be on probation   History  Alcohol Use  . 2.4 oz/week  . 4 Cans of beer per week     History  Drug Use  . Types: Cocaine    Social History   Social History  . Marital status: Single  Spouse name: N/A  . Number of children: N/A  . Years of education: N/A   Social History Main Topics  . Smoking status: Current Every Day Smoker    Packs/day: 0.25    Years: 15.00    Types: Cigarettes  . Smokeless  tobacco: Never Used  . Alcohol use 2.4 oz/week    4 Cans of beer per week  . Drug use:     Types: Cocaine  . Sexual activity: No   Other Topics Concern  . None   Social History Narrative  . None    Current Medications: Current Facility-Administered Medications  Medication Dose Route Frequency Provider Last Rate Last Dose  . acetaminophen (TYLENOL) tablet 650 mg  650 mg Oral Q6H PRN Jolanta B Pucilowska, MD      . albuterol (PROVENTIL HFA;VENTOLIN HFA) 108 (90 Base) MCG/ACT inhaler 1-2 puff  1-2 puff Inhalation Q6H PRN Jolanta B Pucilowska, MD      . alum & mag hydroxide-simeth (MAALOX/MYLANTA) 200-200-20 MG/5ML suspension 30 mL  30 mL Oral Q4H PRN Jolanta B Pucilowska, MD      . dronabinol (MARINOL) capsule 2.5 mg  2.5 mg Oral QAC lunch Jimmy Footman, MD   2.5 mg at 09/01/16 1222  . elvitegravir-cobicistat-emtricitabine-tenofovir (GENVOYA) 150-150-200-10 MG tablet 1 tablet  1 tablet Oral Q breakfast Jimmy Footman, MD   1 tablet at 09/02/16 [phone]  . feeding supplement (ENSURE ENLIVE) (ENSURE ENLIVE) liquid 237 mL  237 mL Oral TID BM Jimmy Footman, MD   237 mL at 09/01/16 2100  . hydrocortisone (ANUSOL-HC) suppository 25 mg  25 mg Rectal BID Jolanta B Pucilowska, MD      . ibuprofen (ADVIL,MOTRIN) tablet 600 mg  600 mg Oral Q6H PRN Jolanta B Pucilowska, MD      . magnesium hydroxide (MILK OF MAGNESIA) suspension 30 mL  30 mL Oral Daily PRN Jolanta B Pucilowska, MD      . mirtazapine (REMERON) tablet 15 mg  15 mg Oral QHS Jimmy Footman, MD   15 mg at 09/01/16 2106  . multivitamin with minerals tablet 1 tablet  1 tablet Oral Q1200 Jimmy Footman, MD   1 tablet at 09/01/16 1221  . nicotine (NICODERM CQ - dosed in mg/24 hours) patch 21 mg  21 mg Transdermal Q0600 Jolanta B Pucilowska, MD      . sertraline (ZOLOFT) tablet 25 mg  25 mg Oral Daily Jimmy Footman, MD   25 mg at 09/02/16 6701    Lab Results:  Results for  orders placed or performed during the hospital encounter of 08/31/16 (from the past 48 hour(s))  CBC     Status: Abnormal   Collection Time: 09/01/16  7:25 AM  Result Value Ref Range   WBC 2.8 (L) 3.8 - 10.6 K/uL   RBC 3.78 (L) 4.40 - 5.90 MIL/uL   Hemoglobin 12.5 (L) 13.0 - 18.0 g/dL   HCT 59.7 (L) 61.1 - 16.5 %   MCV 95.5 80.0 - 100.0 fL   MCH 33.1 26.0 - 34.0 pg   MCHC 34.7 32.0 - 36.0 g/dL   RDW 97.8 (H) 59.4 - 01.4 %   Platelets 104 (L) 150 - 440 K/uL  Comprehensive metabolic panel     Status: Abnormal   Collection Time: 09/01/16  7:25 AM  Result Value Ref Range   Sodium 138 135 - 145 mmol/L   Potassium 3.8 3.5 - 5.1 mmol/L   Chloride 105 101 - 111 mmol/L   CO2 29 22 - 32 mmol/L  Glucose, Bld 90 65 - 99 mg/dL   BUN 9 6 - 20 mg/dL   Creatinine, Ser 0.91 0.61 - 1.24 mg/dL   Calcium 8.6 (L) 8.9 - 10.3 mg/dL   Total Protein 8.1 6.5 - 8.1 g/dL   Albumin 3.1 (L) 3.5 - 5.0 g/dL   AST 77 (H) 15 - 41 U/L   ALT 51 17 - 63 U/L   Alkaline Phosphatase 63 38 - 126 U/L   Total Bilirubin 0.7 0.3 - 1.2 mg/dL   GFR calc non Af Amer >60 >60 mL/min   GFR calc Af Amer >60 >60 mL/min    Comment: (NOTE) The eGFR has been calculated using the CKD EPI equation. This calculation has not been validated in all clinical situations. eGFR's persistently <60 mL/min signify possible Chronic Kidney Disease.    Anion gap 4 (L) 5 - 15  TSH     Status: None   Collection Time: 09/01/16  7:25 AM  Result Value Ref Range   TSH 1.208 0.350 - 4.500 uIU/mL    Comment: Performed by a 3rd Generation assay with a functional sensitivity of <=0.01 uIU/mL.  Lipid panel     Status: Abnormal   Collection Time: 09/01/16  7:25 AM  Result Value Ref Range   Cholesterol 175 0 - 200 mg/dL   Triglycerides 73 <150 mg/dL   HDL 40 (L) >40 mg/dL   Total CHOL/HDL Ratio 4.4 RATIO   VLDL 15 0 - 40 mg/dL   LDL Cholesterol 120 (H) 0 - 99 mg/dL    Comment:        Total Cholesterol/HDL:CHD Risk Coronary Heart Disease Risk  Table                     Men   Women  1/2 Average Risk   3.4   3.3  Average Risk       5.0   4.4  2 X Average Risk   9.6   7.1  3 X Average Risk  23.4   11.0        Use the calculated Patient Ratio above and the CHD Risk Table to determine the patient's CHD Risk.        ATP III CLASSIFICATION (LDL):  <100     mg/dL   Optimal  100-129  mg/dL   Near or Above                    Optimal  130-159  mg/dL   Borderline  160-189  mg/dL   High  >190     mg/dL   Very High     Blood Alcohol level:  No results found for: [facility]  Metabolic Disorder Labs: No results found for: HGBA1C, MPG No results found for: PROLACTIN Lab Results  Component Value Date   CHOL 175 09/01/2016   TRIG 73 09/01/2016   HDL 40 (L) 09/01/2016   CHOLHDL 4.4 09/01/2016   VLDL 15 09/01/2016   LDLCALC 120 (H) 09/01/2016    Physical Findings: AIMS:  , ,  ,  ,    CIWA:    COWS:     Musculoskeletal: Strength & Muscle Tone: within normal limits Gait & Station: normal Patient leans: N/A  Psychiatric Specialty Exam: Physical Exam  Constitutional: He is oriented to person, place, and time. He appears well-developed.  HENT:  Head: Normocephalic and atraumatic.  Eyes: Conjunctivae and EOM are normal.  Neck: Normal range of motion.  Respiratory: Effort normal.  Musculoskeletal: Normal range of motion.  Neurological: He is alert and oriented to person, place, and time.    Review of Systems  Constitutional: Negative.   HENT: Negative.   Eyes: Negative.   Respiratory: Negative.   Cardiovascular: Negative.   Gastrointestinal: Negative.   Genitourinary: Negative.   Musculoskeletal: Negative.   Skin: Negative.   Neurological: Negative.   Endo/Heme/Allergies: Negative.   Psychiatric/Behavioral: Positive for depression and substance abuse.    Blood pressure 121/65, pulse (!) 55, temperature 98.4 F (36.9 C), temperature source Oral, resp. rate 18, height '5\' 9"'$  (1.753 m), weight 54.4 kg (120 lb), SpO2 99  %.Body mass index is 17.72 kg/m.  General Appearance: Fairly Groomed  Eye Contact:  Minimal  Speech:  Slow  Volume:  Decreased  Mood:  Dysphoric  Affect:  Blunt  Thought Process:  Linear and Descriptions of Associations: Intact  Orientation:  Full (Time, Place, and Person)  Thought Content:  Hallucinations: None  Suicidal Thoughts:  No  Homicidal Thoughts:  No  Memory:  Immediate;   Good Recent;   Good Remote;   Good  Judgement:  Fair  Insight:  Fair  Psychomotor Activity:  Decreased  Concentration:  Concentration: Good and Attention Span: Good  Recall:  Good  Fund of Knowledge:  Good  Language:  Good  Akathisia:  No  Handed:    AIMS (if indicated):     Assets:  Armed forces logistics/support/administrative officer Social Support  ADL's:  Intact  Cognition:  WNL  Sleep:  Number of Hours: 6.45     Treatment Plan Summary:  Patient is improving appears still depressed, still has psychomotor retardation and blunted affect but no longer suicidal.  For major depressive disorder the patient Has been is started on Zoloft 25 mg by mouth daily.  For insomnia: restarted on which was part of his home medications mirtazapine 15 mg qhs  For tobacco use disorder the patient declines from receiving a nicotine patch  For cocaine and alcohol use disorder patient will be referred to outpatient substance abuse treatment upon discharge. There is no evidence of withdrawal at this time.  For HIV: continued on Genvoya  Poor appetite: ensure tid, will restart on marinol and multivitamins   Labs: Ast 15, alcohol was 120,  + cocaine, RPR neg, EKG sinus brady 58.  UA clear  Dispo: back [place] with brother  F/u: will need psych f/u  Approximately length of stay up to 3 days.  Hildred Priest, MD 09/02/2016, 9:13 AM

## 2016-09-02 NOTE — [facility] Group Notes (Signed)
[facility] LCSW Group Therapy  09/02/2016 2:02 PM  Type of Therapy:  Group Therapy  Participation Level:  Active  Participation Quality:  Appropriate and Sharing  Affect:  Appropriate  Cognitive:  Alert  Insight:  Engaged  Engagement in Therapy:  Engaged  Modes of Intervention:  Activity, Discussion, Education, Socialization and Support  Summary of Progress/Problems:Coping Skills: Patients defined and discussed healthy coping skills. Patients identified healthy coping skills they would like to try during hospitalization and after discharge. CSW offered insight to varying coping skills that may have been new to patients such as practicing mindfulness.  Hana Trippett G. Garnette CzechSampson MSW, LCSWA 09/02/2016, 2:09 PM

## 2016-09-02 NOTE — Progress Notes (Signed)
Patient with sad affect, cooperative behavior with meals, meds and plan of care. No SI/HI at this time. Quiet with peers, verbalizes needs appropriately with staff. Drinks nutritional shakes and meets with dietician. Safety maintained.

## 2016-09-02 NOTE — [facility] Suicide Risk Assessment (Signed)
[facility] INPATIENT:  Family/Significant Other Suicide Prevention Education  Suicide Prevention Education:  Education Completed;Henry Earlene PlaterDavis (brother [phone]), has been identified by the patient as the family member/significant other with whom the patient will be residing, and identified as the person(s) who will aid the patient in the event of a mental health crisis (suicidal ideations/suicide attempt).  With written consent from the patient, the family member/significant other has been provided the following suicide prevention education, prior to the and/or following the discharge of the patient. Brother confirmed that patient has been staying with him but he left the house on 08/24/2016 and brother had not seen him since. Brother stated that he would be able to provide transportation at discharge and that he would ensure patient follows-[facility] at [facility] with [facility] for [facility].   The suicide prevention education provided includes the following:  Suicide risk factors  Suicide prevention and interventions  National Suicide Hotline telephone number  [facility] assessment telephone number  [facility] Emergency Assistance 911  [facility] and/or Residential Mobile Crisis Unit telephone number  Request made of family/significant other to:  Remove weapons (e.g., guns, rifles, knives), all items previously/currently identified as safety concern.    Remove drugs/medications (over-the-counter, prescriptions, illicit drugs), all items previously/currently identified as a safety concern.  The family member/significant other verbalizes understanding of the suicide prevention education information provided.  The family member/significant other agrees to remove the items of safety concern listed above.  Henry Kallen G. Garnette CzechSampson MSW, LCSWA 09/02/2016, 10:58 AM

## 2016-09-03 LAB — T-HELPER CELLS CD4/CD8 %
% CD 4 POS. LYMPH.: 14.1 % — AB (ref 30.8–58.5)
ABSOLUTE CD 4 HELPER: 155 /uL — AB (ref 359–1519)
BASOS ABS: 0 10*3/uL (ref 0.0–0.2)
BASOS: 2 %
CD3+CD4+ CELLS/CD3+CD8+ CELLS BLD: 0.2 — AB (ref 0.92–3.72)
CD3+CD8+ CELLS # BLD: 777 /uL (ref 109–897)
CD3+CD8+ Cells NFr Bld: 70.6 % — ABNORMAL HIGH (ref 12.0–35.5)
EOS (ABSOLUTE): 0 10*3/uL (ref 0.0–0.4)
EOS: 1 %
Hematocrit: 40.8 % (ref 37.5–51.0)
Hemoglobin: 14.3 g/dL (ref 13.0–17.7)
Immature Grans (Abs): 0.1 10*3/uL (ref 0.0–0.1)
Immature Granulocytes: 2 %
Lymphocytes Absolute: 1.1 10*3/uL (ref 0.7–3.1)
Lymphs: 43 %
MCH: 33.2 pg — ABNORMAL HIGH (ref 26.6–33.0)
MCHC: 35 g/dL (ref 31.5–35.7)
MCV: 95 fL (ref 79–97)
MONOCYTES: 14 %
Monocytes Absolute: 0.4 10*3/uL (ref 0.1–0.9)
NEUTROS ABS: 1 10*3/uL — AB (ref 1.4–7.0)
Neutrophils: 38 %
Platelets: 144 10*3/uL — ABNORMAL LOW (ref 150–379)
RBC: 4.31 x10E6/uL (ref 4.14–5.80)
RDW: 16 % — ABNORMAL HIGH (ref 12.3–15.4)
WBC: 2.6 10*3/uL — AB (ref 3.4–10.8)

## 2016-09-03 LAB — URINE DRUG SCREEN, QUALITATIVE (ARMC ONLY)
AMPHETAMINES, UR SCREEN: NOT DETECTED
Barbiturates, Ur Screen: NOT DETECTED
Benzodiazepine, Ur Scrn: NOT DETECTED
COCAINE METABOLITE, UR ~~LOC~~: NOT DETECTED
Cannabinoid 50 Ng, Ur ~~LOC~~: NOT DETECTED
MDMA (Ecstasy)Ur Screen: NOT DETECTED
METHADONE SCREEN, URINE: NOT DETECTED
OPIATE, UR SCREEN: NOT DETECTED
Phencyclidine (PCP) Ur S: NOT DETECTED
TRICYCLIC, UR SCREEN: NOT DETECTED

## 2016-09-03 LAB — URINALYSIS, COMPLETE (UACMP) WITH MICROSCOPIC
BACTERIA UA: NONE SEEN
BILIRUBIN URINE: NEGATIVE
Glucose, UA: NEGATIVE mg/dL
Hgb urine dipstick: NEGATIVE
KETONES UR: NEGATIVE mg/dL
LEUKOCYTES UA: NEGATIVE
NITRITE: NEGATIVE
Protein, ur: NEGATIVE mg/dL
Specific Gravity, Urine: 1.002 — ABNORMAL LOW (ref 1.005–1.030)
WBC UA: NONE SEEN WBC/hpf (ref 0–5)
pH: 8 (ref 5.0–8.0)

## 2016-09-03 MED ORDER — ELVITEG-COBIC-EMTRICIT-TENOFAF 150-150-200-10 MG PO TABS: 1.0000 | ORAL_TABLET | Freq: Every day | ORAL | 1 refills | Status: DC

## 2016-09-03 MED ORDER — MIRTAZAPINE 15 MG PO TABS: 15.0000 mg | ORAL_TABLET | Freq: Every day | ORAL | 1 refills | Status: DC

## 2016-09-03 MED ORDER — HYDROCORTISONE ACETATE 25 MG RE SUPP: 25.0000 mg | Freq: Two times a day (BID) | RECTAL | 0 refills | Status: DC

## 2016-09-03 MED ORDER — ALBUTEROL SULFATE HFA 108 (90 BASE) MCG/ACT IN AERS: 1.0000 | INHALATION_SPRAY | Freq: Four times a day (QID) | RESPIRATORY_TRACT | 1 refills | Status: DC | PRN

## 2016-09-03 MED ORDER — MEGESTROL ACETATE 400 MG/10ML PO SUSP
400.0000 mg | Freq: Every day | ORAL | Status: DC
  Administered 2016-09-03 – 2016-09-04 (×2): 400 mg via ORAL
  Filled 2016-09-03 (×2): qty 10

## 2016-09-03 MED ORDER — MEGESTROL ACETATE 400 MG/10ML PO SUSP: 400.0000 mg | Freq: Every day | ORAL | 0 refills | Status: DC

## 2016-09-03 NOTE — Plan of Care (Signed)
Problem: Activity: Goal: Interest or engagement in activities will improve Outcome: Progressing Patient group compliant. Spends time in dayroom socializing with peers.

## 2016-09-03 NOTE — Progress Notes (Signed)
Patient pleasant and cooperative with care. Med and group compliant. Appropriate with staff and peers. Spends time socializing in dayroom. Pt denies SI, HI, AVH. Encouragement and support offered. Medication education provided. Encouraged patient to continue to attend group and verbalize feelings. Safety checks maintained. Pt receptive and remains safe on unit with q15 min checks.

## 2016-09-03 NOTE — Progress Notes (Addendum)
[facility] MD Progress Note  09/03/2016 1:56 PM Henry Oconnell  MRN:  [id number] Subjective:  Patient is a 65 year old single African-American male from [facility] [place]. He was transferred from [facility] emergency department to our unit for further stabilization and treatment of worsening depression.  Patient states that he went voluntarily to [facility] emergency department on December 7 requesting help for suicidal ideation. States that he was having thoughts about killing himself anyway he could. Patient says that his main stressor is his health condition. Patient states he's been diagnosed with HIV since the 90s. He says that for the last couple weeks he has not been taking medications because he just didn't feel like it.  Patient also has legal stressors. He was just released from prison in August. He is currently on probation. Patient described his mood as irritable, denies problems with sleep, appetite, energy or concentration. He denies any hallucinations or homicidal ideation.  12/10 patient reports feeling much better this morning. He said he slept very well last night. His mood has improved and he is not longer having suicidal ideation. He feels that his appetite is still poor but he feels is improving. He denies having any side effects from medications, he denies having any physical complaints. Patient denies homicidal ideation and auditory or visual hallucinations.  12/11Mr. Blank continues to improve but still unable to contract for safety outside the hospital. Depression 8/10. Denies somatic complaints. He tolerates medications well. He called probation officer this morning as he will be missing court ordered class on Tuesday. Sleep is fair. He gets Ensure. Good group participation.   Per nursing: Pt denies SI/HI/AVH. Affect noted to be brighter. Visible in milieu interacting appropriately with staff and peers. Denies pain. Medication compliant. Pleasant during interaction. Safety maintained,  will continue to monitor.  Principal Problem: Major depressive disorder, recurrent severe without psychotic features (HCC) Diagnosis:   Patient Active Problem List   Diagnosis Date Noted  . Cocaine use disorder, moderate, dependence (HCC) [F14.20] 09/01/2016  . Major depressive disorder, recurrent severe without psychotic features (HCC) [F33.2] 08/31/2016  . Alcohol use disorder, moderate, dependence (HCC) [F10.20] 08/31/2016  . Suicidal ideation [R45.851] 08/31/2016  . Noncompliance [Z91.19] 08/31/2016  . Tobacco use disorder [F17.200] 08/31/2016  . Human immunodeficiency virus (HIV) disease (HCC) [B20] 08/31/2016  . Hepatitis C [B19.20] 08/31/2016  . Hepatitis B [B19.10] 08/31/2016   Total Time spent with patient: 30 minutes  Past Psychiatric History: [place] and [place]icidality. He denies having any prior suicidal attempts but says that in the past he has tried to drink himself today and overdosed on cocaine.  Past Medical History:  Past Medical History:  Diagnosis Date  . Asthma   . COPD (chronic obstructive pulmonary disease) (HCC)   . Hep C w/ coma, chronic (HCC)   . HIV (human immunodeficiency virus infection) (HCC)   . Hypertension    History reviewed. No pertinent surgical history.  Family History: History reviewed. No pertinent family history.  Family Psychiatric  History: He denies any family history of mental illness, substance abuse or suicide  Social History: Patient is currently living with his brother in [place]. He is single, never married, has 2 children ages  As far as his legal history he had charges for breaking and entering. He violated probation and went to prison for 15 months. He was just released this past August. He continues to be on probation  History  Alcohol Use  . 2.4 oz/week  . 4 Cans of beer per week     History  Drug Use  . Types: Cocaine     Social History   Social History  . Marital status: Single    Spouse name: N/A  . Number of children: N/A  . Years of education: N/A   Social History Main Topics  . Smoking status: Current Every Day Smoker    Packs/day: 0.25    Years: 15.00    Types: Cigarettes  . Smokeless tobacco: Never Used  . Alcohol use 2.4 oz/week    4 Cans of beer per week  . Drug use:     Types: Cocaine  . Sexual activity: No   Other Topics Concern  . None   Social History Narrative  . None    Current Medications: Current Facility-Administered Medications  Medication Dose Route Frequency Provider Last Rate Last Dose  . acetaminophen (TYLENOL) tablet 650 mg  650 mg Oral Q6H PRN Lounette Sloan B Lucita Montoya, MD      . albuterol (PROVENTIL HFA;VENTOLIN HFA) 108 (90 Base) MCG/ACT inhaler 1-2 puff  1-2 puff Inhalation Q6H PRN Dyquan Minks B Jay Kempe, MD      . alum & mag hydroxide-simeth (MAALOX/MYLANTA) 200-200-20 MG/5ML suspension 30 mL  30 mL Oral Q4H PRN Terri Malerba B Margarito Dehaas, MD      . dronabinol (MARINOL) capsule 2.5 mg  2.5 mg Oral QAC lunch Jimmy FootmanAndrea Hernandez-Gonzalez, MD   2.5 mg at 09/02/16 1137  . elvitegravir-cobicistat-emtricitabine-tenofovir (GENVOYA) 150-150-200-10 MG tablet 1 tablet  1 tablet Oral Q breakfast Jimmy FootmanAndrea Hernandez-Gonzalez, MD   1 tablet at 09/03/16 [phone]  . feeding supplement (ENSURE ENLIVE) (ENSURE ENLIVE) liquid 237 mL  237 mL Oral TID BM Jimmy FootmanAndrea Hernandez-Gonzalez, MD   237 mL at 09/03/16 1000  . hydrocortisone (ANUSOL-HC) suppository 25 mg  25 mg Rectal BID Corley Kohls B Shalee Paolo, MD   25 mg at 09/02/16 1700  . ibuprofen (ADVIL,MOTRIN) tablet 600 mg  600 mg Oral Q6H PRN Annett Boxwell B Danaye Sobh, MD      . magnesium hydroxide (MILK OF MAGNESIA) suspension 30 mL  30 mL Oral Daily PRN Lochlan Grygiel B Destiny Hagin, MD      . mirtazapine (REMERON) tablet 15 mg  15 mg Oral QHS Jimmy FootmanAndrea Hernandez-Gonzalez, MD   15 mg at 09/02/16 2134  . multivitamin with minerals tablet 1 tablet  1 tablet Oral Q1200 Jimmy FootmanAndrea  Hernandez-Gonzalez, MD   1 tablet at 09/03/16 1209  . nicotine (NICODERM CQ - dosed in mg/24 hours) patch 21 mg  21 mg Transdermal Q0600 Maston Wight B Dorinda Stehr, MD      . sertraline (ZOLOFT) tablet 25 mg  25 mg Oral Daily Jimmy FootmanAndrea Hernandez-Gonzalez, MD   25 mg at 09/03/16 [id number]    Lab Results:  No results found for this or any previous visit (from the past 48 hour(s)).  Blood Alcohol level:  No results found for: [facility]  Metabolic Disorder Labs: Lab Results  Component Value Date   HGBA1C 5.5 09/01/2016   MPG 111 09/01/2016   No results found for: PROLACTIN Lab Results  Component Value Date   CHOL 175 09/01/2016   TRIG 73 09/01/2016   HDL 40 (L) 09/01/2016   CHOLHDL 4.4 09/01/2016   VLDL 15 09/01/2016   LDLCALC 120 (H) 09/01/2016    Physical Findings: AIMS:  , ,  ,  ,    CIWA:    COWS:     Musculoskeletal: Strength & Muscle Tone: within normal limits Gait &  Station: normal Patient leans: N/A  Psychiatric Specialty Exam: Physical Exam  Nursing note and vitals reviewed.   Review of Systems  Psychiatric/Behavioral: Positive for depression and substance abuse.  All other systems reviewed and are negative.   Blood pressure 115/67, pulse (!) 52, temperature 97.6 F (36.4 C), temperature source Oral, resp. rate 18, height 5\' 9"  (1.753 m), weight 54.4 kg (120 lb), SpO2 99 %.Body mass index is 17.72 kg/m.  General Appearance: Fairly Groomed  Eye Contact:  Minimal  Speech:  Slow  Volume:  Decreased  Mood:  Dysphoric  Affect:  Blunt  Thought Process:  Linear and Descriptions of Associations: Intact  Orientation:  Full (Time, Place, and Person)  Thought Content:  Hallucinations: None  Suicidal Thoughts:  No  Homicidal Thoughts:  No  Memory:  Immediate;   Good Recent;   Good Remote;   Good  Judgement:  Fair  Insight:  Fair  Psychomotor Activity:  Decreased  Concentration:  Concentration: Good and Attention Span: Good  Recall:  Good  Fund of Knowledge:  Good  Language:   Good  Akathisia:  No  Handed:    AIMS (if indicated):     Assets:  Communication Skills Social Support  ADL's:  Intact  Cognition:  WNL  Sleep:  Number of Hours: 7.3     Treatment Plan Summary:  [name] is a 65 year old male with history of depression and substance abuse admitted for suicidal ideation.  1. Suicidal ideation. The patient is able to contract for safety in the hospital but not in the community.  2. Mood. We started Zoloft and Remeron for depression.   3. Smoking.  Nicotine patch is available.   4. Substance abuse. There are no symptoms of alcohol withdrawal. The patient minimizes his problems and declines residential treatment.   5. HIV. We continued Genvoya for HIV. RPR negative.  6. Poor appetite. Ensure and multivitamins are available. I will discontinue Marinol and start Megace.  7. COPD. He is on inhaler.   8. Disposition. He will be discharged with his brother. [facility] and [facility]. Following release from prison in August, the patient did not have Medicaid but it was now reinstated.   Kristine LineaJolanta Delta Deshmukh, MD 09/03/2016, 1:56 PM I

## 2016-09-03 NOTE — Progress Notes (Signed)
Pt denies SI/HI/AVH. Affect noted to be brighter. Visible in milieu interacting appropriately with staff and peers. Denies pain. Medication compliant. Pleasant during interaction. Safety maintained, will continue to monitor.

## 2016-09-03 NOTE — [facility] Group Notes (Addendum)
[facility] LCSW Group Therapy   09/03/2016 9:30am  Type of Therapy: Group Therapy   Participation Level: Active   Participation Quality: Attentive, Sharing and Supportive   Affect: Appropriate   Cognitive: Alert and Oriented   Insight: Developing/Improving and Engaged   Engagement in Therapy: Developing/Improving and Engaged   Modes of Intervention: Clarification, Confrontation, Discussion, Education, Exploration,  Limit-setting, Orientation, Problem-solving, Rapport Building, Dance movement psychotherapisteality Testing, Socialization and Support   Summary of Progress/Problems: Pt identified obstacles faced currently and processed barriers involved in overcoming these obstacles. Pt identified steps necessary for overcoming these obstacles and explored motivation (internal and external) for facing these difficulties head on. Pt further identified one area of concern in their lives and chose a goal to focus on for today. Pt defined obstacle as "trying to do good but having negative things occur." Pt identified drinkinhg as an obstacle that he faces. He stated that his alternative coping mechanisms are being more involved in self-care activities and support groups.  Hampton AbbotKadijah Areesha Dehaven, MSW, LCSWA 09/03/2016, 10:29AM

## 2016-09-03 NOTE — Progress Notes (Signed)
Recreation Therapy Notes  INPATIENT RECREATION THERAPY ASSESSMENT  Patient Details Name: Henry Oconnell MRN: [id number] DOB: 12/18/1950 Today's Date: 09/03/2016  Patient Stressors: Other (Comment) (His health)  Coping Skills:   Isolate, Substance Abuse, Avoidance, Exercise, Art/Dance, Music, Sports, Other (Comment) (Yard work)  Engineer, building servicesersonal Challenges: Decision-Making, Expressing Yourself, Stress Management, Substance Abuse  Leisure Interests (2+):  Individual - TV, Individual - Other (Comment) (Yard work)  Biochemist, clinicalAwareness of Community Resources:  Yes  Community Resources:  Park, Other (Comment) (Pool room)  Current Use: Yes  If no, Barriers?:    Patient Strengths:  Being alive, can see kids  Patient Identified Areas of Improvement:  Drinking  Current Recreation Participation:  Go to classes  Patient Goal for Hospitalization:  Try to get himself better  [place] Residence[place]   Current SI (including self-harm):  No  Current HI:  No  Consent to Intern Participation: N/A   Jacquelynn CreeGreene,Tiphany Fayson M, LRT/CTRS 09/03/2016, 11:25 AM

## 2016-09-03 NOTE — Progress Notes (Signed)
Recreation Therapy Notes  Date: 12.11.17 Time: 1:00 pm Location: Craft Room  Group Topic: Wellness  Goal Area(s) Addresses:  Patient will identify at least one item per dimension of health. Patient will examine areas they are deficient in.  Behavioral Response: Attentive, Interactive  Intervention: 6 Dimensions of Wellness  Activity: Patients were given a definition sheet with the 6 dimensions of wellness. Patients were given a worksheet with each dimension listed and were instructed to write at least one thing they were currently doing. LRT encouraged patients to write 2-3   Education: LRT educated patients on ways to improve each dimension.  Education Outcome: Acknowledges education/In group clarification offered  Clinical Observations/Feedback: Patient wrote at least one item in each dimension. Patient contributed to group discussion by stating what areas he was giving enough attention to, what areas he was not giving enough attention to, ways he could improve certain dimensions, how this activity relates to his admission, how they activity relates to his d/c, and what would change for him if he started being more aware of his wellness.  Jacquelynn CreeGreene,Raad Clayson M, LRT/CTRS 09/03/2016 2:48 PM

## 2016-09-04 DIAGNOSIS — F332 Major depressive disorder, recurrent severe without psychotic features: Principal | ICD-10-CM

## 2016-09-04 NOTE — Progress Notes (Signed)
CSW sent in MD request for payee on patient's behalf. It was established that the Pt is often using his money up on drugs prior to the 5 or so per his family.  Pt is then in the hospital and not caring for his physical, mental, medical needs at that time cue to lack of funds. CSW faxed form to [place] [employer].  Jake SharkSara Dali Kraner, LCSW

## 2016-09-04 NOTE — [facility] Group Notes (Signed)
[facility] Group Notes:  (Nursing/MHT/Case Management/Adjunct)  Date:  09/04/2016  Time:  1:56 PM  Type of Therapy:  Group Therapy  Participation Level:  Active  Participation Quality:  Appropriate, Attentive and Sharing  Affect:  Appropriate  Cognitive:  Appropriate  Insight:  Good  Engagement in Group:  Engaged  Modes of Intervention:  Support  Summary of Progress/Problems:  Henry Oconnell 09/04/2016, 1:56 PM

## 2016-09-04 NOTE — Progress Notes (Signed)
D:Patient aware of discharge this shift . Patient returning home . Patient received all belonging locked up . Patient denies  Suicidal  And homicidal ideations  .  A: Writer instructed on discharge criteria  . Informed of Transitional Record , Suicide Risk Assessment Discharge [place] and prescriptions  given to  Patient f . Aware  Of follow up appointment . R: Patient left unit with no questions  Or concerns  With brother.

## 2016-09-04 NOTE — Plan of Care (Signed)
Problem: Pain Managment: Goal: General experience of comfort will improve Outcome: Progressing Patient denies pain at this time   

## 2016-09-04 NOTE — [facility] Suicide Risk Assessment (Signed)
[facility]   Principal Problem: Major depressive disorder, recurrent severe without psychotic features [facility](HCC) Discharge Diagnoses:  Patient Active Problem List   Diagnosis Date Noted  . Cocaine use disorder, moderate, dependence (HCC) [F14.20] 09/01/2016  . Major depressive disorder, recurrent severe without psychotic features (HCC) [F33.2] 08/31/2016  . Alcohol use disorder, moderate, dependence (HCC) [F10.20] 08/31/2016  . Suicidal ideation [R45.851] 08/31/2016  . Noncompliance [Z91.19] 08/31/2016  . Tobacco use disorder [F17.200] 08/31/2016  . Human immunodeficiency virus (HIV) disease (HCC) [B20] 08/31/2016  . Hepatitis C [B19.20] 08/31/2016  . Hepatitis B [B19.10] 08/31/2016    Total Time spent with patient: 30 minutes  Musculoskeletal: Strength & Muscle Tone: within normal limits Gait & Station: normal Patient leans: N/A  Psychiatric Specialty Exam: Review of Systems  Psychiatric/Behavioral: Positive for substance abuse.  All other systems reviewed and are negative.   Blood pressure 122/65, pulse (!) 50, temperature 97.9 F (36.6 C), temperature source Oral, resp. rate 18, height 5\' 9"  (1.753 m), weight 54.4 kg (120 lb), SpO2 99 %.Body mass index is 17.72 kg/m.  General Appearance: Casual  Eye Contact::  Good  Speech:  Clear and Coherent409  Volume:  Normal  Mood:  Euthymic  Affect:  Appropriate  Thought Process:  Goal Directed and Descriptions of Associations: Intact  Orientation:  Full (Time, Place, and Person)  Thought Content:  WDL  Suicidal Thoughts:  No  Homicidal Thoughts:  No  Memory:  Immediate;   Fair Recent;   Fair Remote;   Fair  Judgement:  Poor  Insight:  Lacking  Psychomotor Activity:  Normal  Concentration:  Fair  Recall:  [employer]  Fund of Knowledge:Fair  Language: Fair  Akathisia:  No  Handed:  Right  AIMS (if indicated):     Assets:  Communication Skills Desire for Improvement Financial  Resources/Insurance Housing Resilience Social Support  Sleep:  Number of Hours: 8  Cognition: WNL  ADL's:  Intact   Mental Status Per Nursing Assessment::   On Admission:     Demographic Factors:  Male and Low socioeconomic status  Loss Factors: Financial problems/change in socioeconomic status  Historical Factors: Family history of mental illness or substance abuse and Impulsivity  Risk Reduction Factors:   Sense of responsibility to family, Living with another person, especially a relative and Positive social support  Continued Clinical Symptoms:  Depression:   Comorbid alcohol abuse/dependence Impulsivity Alcohol/Substance Abuse/Dependencies  Cognitive Features That Contribute To Risk:  None    Suicide Risk:  Minimal: No identifiable suicidal ideation.  Patients presenting with no risk factors but with morbid ruminations; may be classified as minimal risk based on the severity of the depressive symptoms  Follow-up Information    [facility].   Why:  Please follow up 09/05/2016 at 1:45pm for outpatient services including medication management and outpatient therapy. Please bring insurance info, I.D., discharge summary and current medications.  Contact information: [address] [place] [id number] [phone]           Plan Of Care/Follow-up recommendations:  Activity:  +as tolerATED. Diet:  LOW SODIUM HEART HEALTHY. Other:  KEEP FOLLOW UP APPOINTMENTS.   Kristine LineaJolanta Kandice Schmelter, MD 09/04/2016, 12:45 PM

## 2016-09-04 NOTE — Discharge Summary (Deleted)
Physician Discharge Summary Note  Patient:  Fara BorosJulius B Dupas is an 65 y.o., male MRN:  [id number] DOB:  05/05/1951 Patient phone:  [phone] (home)  Patient address:   [address] [place] [address],  Total Time spent with patient: 30 minutes  Date of Admission:  08/31/2016 Date of Discharge: 09/04/2016  Reason for Admission:  Suicidal ideation.  Patient is a 65 year old single African-American male from [place]. He was [facility] from [facility] department to our unit for further stabilization and treatment of worsening depression.  Patient states that he [facility] on December 7 requesting help for suicidal ideation. States that he was having thoughts about killing himself anyway he could. Patient says that his main stressor is his health condition. Patient states he's been diagnosed with HIV since the 90s. He says that for the last couple weeks he has not been taking medications because he just didn't feel like it.  Patient also has legal stressors. He was just released from prison in August. He is currently on probation.  Patient described his mood as irritable, denies problems with sleep, appetite, energy or concentration. He denies any hallucinations or homicidal ideation.  As far as substance abuse patient says he relapsed on cocaine/crack about a week ago. He had a past history of cocaine but had been sober since August. He also has a history of alcohol dependence. Says his only been drinking about 4 beers per week. Patient says that while in prison he completed the DART program. Patient smokes about one pack of cigarettes per week.  Associated Signs/Symptoms: Depression Symptoms:  depressed mood, hopelessness, recurrent thoughts of death, suicidal thoughts without plan, (Hypo) Manic Symptoms:  Impulsivity, Anxiety Symptoms:  Excessive Worry, Psychotic Symptoms:  denies PTSD Symptoms: Negative  Past  Psychiatric History: [place] and [place]icidality. He denies having any prior suicidal attempts but says that in the past he has tried to drink himself today and overdosed on cocaine.  Family Psychiatric  History: He denies any family history of mental illness, substance abuse or suicide  Social History: Patient is currently living with his brother in [place]. He is single, never married, has 2 children ages  As far as his legal history he had charges for breaking and entering. He violated probation and went to prison for 15 months. He was just released this past August. He continues to be on probation   Principal Problem: Major depressive disorder, recurrent severe without psychotic features [facility](HCC) Discharge Diagnoses: Patient Active Problem List   Diagnosis Date Noted  . Cocaine use disorder, moderate, dependence (HCC) [F14.20] 09/01/2016  . Major depressive disorder, recurrent severe without psychotic features (HCC) [F33.2] 08/31/2016  . Alcohol use disorder, moderate, dependence (HCC) [F10.20] 08/31/2016  . Suicidal ideation [R45.851] 08/31/2016  . Noncompliance [Z91.19] 08/31/2016  . Tobacco use disorder [F17.200] 08/31/2016  . Human immunodeficiency virus (HIV) disease (HCC) [B20] 08/31/2016  . Hepatitis C [B19.20] 08/31/2016  . Hepatitis B [B19.10] 08/31/2016    Past Medical History: Past Medical History:  Diagnosis Date  . Asthma   . COPD (chronic obstructive pulmonary disease) (HCC)   . Hep C w/ coma, chronic (HCC)   . HIV (human immunodeficiency virus infection) (HCC)   . Hypertension    History reviewed. No pertinent surgical history. Family History: History reviewed. No pertinent family history.  Social History:  History  Alcohol Use  .  2.4 oz/week  . 4 Cans of beer per week     History  Drug Use  . Types: Cocaine    Social History   Social History  . Marital  status: Single    Spouse name: N/A  . Number of children: N/A  . Years of education: N/A   Social History Main Topics  . Smoking status: Current Every Day Smoker    Packs/day: 0.25    Years: 15.00    Types: Cigarettes  . Smokeless tobacco: Never Used  . Alcohol use 2.4 oz/week    4 Cans of beer per week  . Drug use:     Types: Cocaine  . Sexual activity: No   Other Topics Concern  . None   Social History Narrative  . None    Hospital Course:    [name] is a 65 year old male with history of depression and substance abuse admitted for suicidal ideation.  1. Suicidal ideation. Resolved. The patient is able to contract for safety in the hospital. He is forward thinking and optimistic about the future.   2. Mood. We started Remeron for depression.   3. Smoking.  Nicotine patch was available.   4. Substance abuse. There were no symptoms of alcohol withdrawal. Vital signs were stable. The patient minimizes his problems and declines residential treatment.   5. HIV. The patient was noncompliant with treatment since release from jail in August. We restarted Genvoya for HIV. RPR negative. His CD4 is low at 0.20.  6. Poor appetite. We gave Ensure and Megace.  7. COPD. He was on inhaler.   8. Social. During incarceration, the patient lost Medicaid and disability. It is now reinstated. The patient has not been able to manage his money. We support a payee.  9. Disposition. He was discharged to home with his brother. He will [facility] for medication management and with his  [facility] doctor [facility].   Physical Findings: AIMS:  , ,  ,  ,    CIWA:    COWS:     Musculoskeletal: Strength & Muscle Tone: within normal limits Gait & Station: normal Patient leans: N/A  Psychiatric Specialty Exam: Physical Exam  Nursing note and vitals reviewed.   Review of Systems  Constitutional: Positive for weight loss.  Psychiatric/Behavioral: Positive for  substance abuse.  All other systems reviewed and are negative.   Blood pressure 122/65, pulse (!) 50, temperature 97.9 F (36.6 C), temperature source Oral, resp. rate 18, height 5\' 9"  (1.753 m), weight 54.4 kg (120 lb), SpO2 99 %.Body mass index is 17.72 kg/m.  General Appearance: Casual  Eye Contact:  Good  Speech:  Clear and Coherent  Volume:  Normal  Mood:  Euthymic  Affect:  Appropriate  Thought Process:  Goal Directed and Descriptions of Associations: Intact  Orientation:  Full (Time, Place, and Person)  Thought Content:  WDL  Suicidal Thoughts:  No  Homicidal Thoughts:  No  Memory:  Immediate;   Fair Recent;   Fair Remote;   Fair  Judgement:  Impaired  Insight:  Lacking  Psychomotor Activity:  Normal  Concentration:  Concentration: Fair and Attention Span: Fair  Recall:  [employer]Fair  Fund of Knowledge:  Fair  Language:  Fair  Akathisia:  No  Handed:  Right  AIMS (if indicated):     Assets:  Communication Skills Desire for Improvement Financial Resources/Insurance Housing Resilience Social Support  ADL's:  Intact  Cognition:  WNL  Sleep:  Number  of Hours: 8     Have you used any form of tobacco in the last 30 days? (Cigarettes, Smokeless Tobacco, Cigars, and/or Pipes): Yes  Has this patient used any form of tobacco in the last 30 days? (Cigarettes, Smokeless Tobacco, Cigars, and/or Pipes) Yes, Yes, A prescription for an FDA-approved tobacco cessation medication was offered at discharge and the patient refused  Blood Alcohol level:  No results found for: [facility]  Metabolic Disorder Labs:  Lab Results  Component Value Date   HGBA1C 5.5 09/01/2016   MPG 111 09/01/2016   No results found for: PROLACTIN Lab Results  Component Value Date   CHOL 175 09/01/2016   TRIG 73 09/01/2016   HDL 40 (L) 09/01/2016   CHOLHDL 4.4 09/01/2016   VLDL 15 09/01/2016   LDLCALC 120 (H) 09/01/2016    See Psychiatric Specialty Exam and Suicide Risk Assessment completed by Attending  Physician prior to discharge.  Discharge destination:  Home  Is patient on multiple antipsychotic therapies at discharge:  No   Has Patient had three or more failed trials of antipsychotic monotherapy by history:  No  Recommended Plan for Multiple Antipsychotic Therapies: NA  Discharge Instructions    Diet - low sodium heart healthy    Complete by:  As directed    Increase activity slowly    Complete by:  As directed        Medication List    TAKE these medications     Indication  albuterol 108 (90 Base) MCG/ACT inhaler Commonly known as:  PROVENTIL HFA;VENTOLIN HFA Inhale 1-2 puffs into the lungs every 6 (six) hours as needed for wheezing or shortness of breath.  Indication:  Chronic Obstructive Lung Disease   elvitegravir-cobicistat-emtricitabine-tenofovir 150-150-200-10 MG Tabs tablet Commonly known as:  GENVOYA Take 1 tablet by mouth daily with breakfast.  Indication:  HIV Disease   hydrocortisone 25 MG suppository Commonly known as:  ANUSOL-HC Place 1 suppository (25 mg total) rectally 2 (two) times daily.  Indication:  Inflamed Hemorrhoids   megestrol 400 MG/10ML suspension Commonly known as:  MEGACE Take 10 mLs (400 mg total) by mouth daily.  Indication:  General Weight Loss and Wasting   mirtazapine 15 MG tablet Commonly known as:  REMERON Take 1 tablet (15 mg total) by mouth at bedtime.  Indication:  Major Depressive Disorder      Follow-up Information    [facility] Go on 09/05/2016.   Why:  Please follow up 09/05/2016 at 1:45pm for outpatient services including medication management and outpatient therapy. Please bring insurance info, I.D., discharge summary and current medications.  Contact information: [address] [place] [address] [phone]           Follow-up recommendations:  Activity:  as tolerated. Diet:  low sodium heart healthy. Other:  keep follow up appointments.  Comments:    Signed: Kristine LineaJolanta  Crystin Lechtenberg, MD 09/04/2016, 8:43 AM

## 2016-09-04 NOTE — Plan of Care (Signed)
Problem: [facility] Participation in Recreation Therapeutic Interventions Goal: STG-Patient will identify at least five coping skills for ** STG: Coping Skills - Within 3 treatment sessions, patient will verbalize at least 5 coping skills for substance abuse in one treatment session to decrease substance abuse post d/c.  Outcome: Completed/Met Date Met: 09/04/16 Treatment Session 1; Completed 1 out of 1: At approximately 11:50 am, LRT met with patient in craft room. Patient verbalized 5 coping skills for substance abuse. LRT educated patient on leisure and why it is important to implement it into his schedule. LRT educated and provided patient with blank schedules to help him plan his day and try to avoid using substances. LRT educated patient on healthy support systems.   M , LRT/CTRS 12.12.17 1:40 pm   

## 2016-09-04 NOTE — Progress Notes (Signed)
  [facility] Discharge Plan :  Will you be returning to the same living situation after discharge:  Yes,    At discharge, do you have transportation home?:Yes Do you have the ability to pay for your medications: Yes,     Release of information consent forms completed and in the chart;  Patient's signature needed at discharge.  Patient to Follow up at: Follow-up Information    [facility] Go on 09/05/2016.   Why:  Please follow up 09/05/2016 at 1:45pm for outpatient services including medication management and outpatient therapy. Please bring insurance info, I.D., discharge summary and current medications.  Contact information: [address] [place] [address] [phone]           Next level of care provider has access to [facility] Link:no  Safety Planning and Suicide Prevention discussed: Yes,     Have you used any form of tobacco in the last 30 days? (Cigarettes, Smokeless Tobacco, Cigars, and/or Pipes): Yes  Has patient been referred to the Quitline?: Patient refused referral  Patient has been referred for addiction treatment: Yes  Glennon MacSara P Wake Conlee, MSW, LCSW 09/04/2016, 9:58 AM

## 2016-09-04 NOTE — Tx Team (Signed)
Interdisciplinary Treatment and Diagnostic Plan Update  09/04/2016 Time of Session: 10:30am Henry Oconnell MRN: [id number]  Principal Diagnosis: Major depressive disorder, recurrent severe without psychotic features (HCC)  Secondary Diagnoses: Principal Problem:   Major depressive disorder, recurrent severe without psychotic features (HCC) Active Problems:   Alcohol use disorder, moderate, dependence (HCC)   Suicidal ideation   Noncompliance   Tobacco use disorder   Human immunodeficiency virus (HIV) disease (HCC)   Hepatitis C   Hepatitis B   Cocaine use disorder, moderate, dependence (HCC)   Current Medications:  Current Facility-Administered Medications  Medication Dose Route Frequency Provider Last Rate Last Dose  . acetaminophen (TYLENOL) tablet 650 mg  650 mg Oral Q6H PRN Jolanta B Pucilowska, MD      . albuterol (PROVENTIL HFA;VENTOLIN HFA) 108 (90 Base) MCG/ACT inhaler 1-2 puff  1-2 puff Inhalation Q6H PRN Jolanta B Pucilowska, MD      . alum & mag hydroxide-simeth (MAALOX/MYLANTA) 200-200-20 MG/5ML suspension 30 mL  30 mL Oral Q4H PRN Jolanta B Pucilowska, MD      . elvitegravir-cobicistat-emtricitabine-tenofovir (GENVOYA) 150-150-200-10 MG tablet 1 tablet  1 tablet Oral Q breakfast Jimmy FootmanAndrea Hernandez-Gonzalez, MD   1 tablet at 09/04/16 [phone]  . feeding supplement (ENSURE ENLIVE) (ENSURE ENLIVE) liquid 237 mL  237 mL Oral TID BM Jimmy FootmanAndrea Hernandez-Gonzalez, MD   237 mL at 09/04/16 1000  . hydrocortisone (ANUSOL-HC) suppository 25 mg  25 mg Rectal BID Jolanta B Pucilowska, MD   25 mg at 09/02/16 1700  . ibuprofen (ADVIL,MOTRIN) tablet 600 mg  600 mg Oral Q6H PRN Jolanta B Pucilowska, MD      . magnesium hydroxide (MILK OF MAGNESIA) suspension 30 mL  30 mL Oral Daily PRN Jolanta B Pucilowska, MD      . megestrol (MEGACE) 400 MG/10ML suspension 400 mg  400 mg Oral Daily Jolanta B Pucilowska, MD   400 mg at 09/04/16 0824  . mirtazapine (REMERON) tablet 15 mg  15 mg Oral QHS Jimmy FootmanAndrea  Hernandez-Gonzalez, MD   15 mg at 09/03/16 2138  . multivitamin with minerals tablet 1 tablet  1 tablet Oral Q1200 Jimmy FootmanAndrea Hernandez-Gonzalez, MD   1 tablet at 09/04/16 1202  . nicotine (NICODERM CQ - dosed in mg/24 hours) patch 21 mg  21 mg Transdermal Q0600 Shari ProwsJolanta B Pucilowska, MD       Current Outpatient Prescriptions  Medication Sig Dispense Refill  . albuterol (PROVENTIL HFA;VENTOLIN HFA) 108 (90 Base) MCG/ACT inhaler Inhale 1-2 puffs into the lungs every 6 (six) hours as needed for wheezing or shortness of breath. 1 Inhaler 1  . elvitegravir-cobicistat-emtricitabine-tenofovir (GENVOYA) 150-150-200-10 MG TABS tablet Take 1 tablet by mouth daily with breakfast. 30 tablet 1  . hydrocortisone (ANUSOL-HC) 25 MG suppository Place 1 suppository (25 mg total) rectally 2 (two) times daily. 12 suppository 0  . megestrol (MEGACE) 400 MG/10ML suspension Take 10 mLs (400 mg total) by mouth daily. 240 mL 0  . mirtazapine (REMERON) 15 MG tablet Take 1 tablet (15 mg total) by mouth at bedtime. 30 tablet 1   PTA Medications: No prescriptions prior to admission.    Patient Stressors: Financial difficulties Health problems Medication change or noncompliance  Patient Strengths: Capable of independent living Supportive family/friends  Treatment Modalities: Medication Management, Group therapy, Case management,  1 to 1 session with clinician, Psychoeducation, Recreational therapy.   Physician Treatment Plan for Primary Diagnosis: Major depressive disorder, recurrent severe without psychotic features (HCC) Long Term Goal(s): Improvement in symptoms so as ready for discharge Improvement  in symptoms so as ready for discharge   Short Term Goals: Ability to identify changes in lifestyle to reduce recurrence of condition will improve Ability to verbalize feelings will improve Ability to disclose and discuss suicidal ideas Ability to demonstrate self-control will improve Ability to identify and develop  effective coping behaviors will improve Compliance with prescribed medications will improve Ability to identify triggers associated with substance abuse/mental health issues will improve Ability to identify and develop effective coping behaviors will improve Compliance with prescribed medications will improve Ability to identify triggers associated with substance abuse/mental health issues will improve  Medication Management: Evaluate patient's response, side effects, and tolerance of medication regimen.  Therapeutic Interventions: 1 to 1 sessions, Unit Group sessions and Medication administration.  Evaluation of Outcomes: Adequate for Discharge  Physician Treatment Plan for Secondary Diagnosis: Principal Problem:   Major depressive disorder, recurrent severe without psychotic features (HCC) Active Problems:   Alcohol use disorder, moderate, dependence (HCC)   Suicidal ideation   Noncompliance   Tobacco use disorder   Human immunodeficiency virus (HIV) disease (HCC)   Hepatitis C   Hepatitis B   Cocaine use disorder, moderate, dependence (HCC)  Long Term Goal(s): Improvement in symptoms so as ready for discharge Improvement in symptoms so as ready for discharge   Short Term Goals: Ability to identify changes in lifestyle to reduce recurrence of condition will improve Ability to verbalize feelings will improve Ability to disclose and discuss suicidal ideas Ability to demonstrate self-control will improve Ability to identify and develop effective coping behaviors will improve Compliance with prescribed medications will improve Ability to identify triggers associated with substance abuse/mental health issues will improve Ability to identify and develop effective coping behaviors will improve Compliance with prescribed medications will improve Ability to identify triggers associated with substance abuse/mental health issues will improve     Medication Management: Evaluate patient's  response, side effects, and tolerance of medication regimen.  Therapeutic Interventions: 1 to 1 sessions, Unit Group sessions and Medication administration.  Evaluation of Outcomes: Adequate for Discharge   RN Treatment Plan for Primary Diagnosis: Major depressive disorder, recurrent severe without psychotic features (HCC) Long Term Goal(s): Knowledge of disease and therapeutic regimen to maintain health will improve  Short Term Goals: Ability to remain free from injury will improve, Ability to verbalize frustration and anger appropriately will improve, Ability to verbalize feelings will improve, Ability to identify and develop effective coping behaviors will improve and Compliance with prescribed medications will improve  Medication Management: RN will administer medications as ordered by provider, will assess and evaluate patient's response and provide education to patient for prescribed medication. RN will report any adverse and/or side effects to prescribing provider.  Therapeutic Interventions: 1 on 1 counseling sessions, Psychoeducation, Medication administration, Evaluate responses to treatment, Monitor vital signs and CBGs as ordered, Perform/monitor CIWA, COWS, AIMS and Fall Risk screenings as ordered, Perform wound care treatments as ordered.  Evaluation of Outcomes: Adequate for Discharge   LCSW Treatment Plan for Primary Diagnosis: Major depressive disorder, recurrent severe without psychotic features (HCC) Long Term Goal(s): Safe transition to appropriate next level of care at discharge, Engage patient in therapeutic group addressing interpersonal concerns.  Short Term Goals: Engage patient in aftercare planning with referrals and resources, Increase social support, Facilitate patient progression through stages of change regarding substance use diagnoses and concerns, Identify triggers associated with mental health/substance abuse issues and Increase skills for wellness and  recovery  Therapeutic Interventions: Assess for all discharge needs, 1 to 1  time with Child psychotherapistocial worker, Explore available resources and support systems, Assess for adequacy in community support network, Educate family and significant other(s) on suicide prevention, Complete Psychosocial Assessment, Interpersonal group therapy.  Evaluation of Outcomes: Adequate for Discharge   Progress in Treatment: Attending groups: Yes. Participating in groups: Yes. Taking medication as prescribed: Yes. Toleration medication: Yes. Family/Significant other contact made: Yes, individual(s) contacted:  brother Theadore NanWyatt Bogdan Patient understands diagnosis: Yes. Discussing patient identified problems/goals with staff: Yes. Medical problems stabilized or resolved: Yes. Denies suicidal/homicidal ideation: Yes. Issues/concerns per patient self-inventory: No. Other:    New problem(s) identified: No, Describe:     New Short Term/Long Term Goal(s):  Discharge Plan or Barriers: Home with Brother, [facility] [place], also [place] Payee request sent to [facility]  Reason for Continuation of Hospitalization: None  Estimated Length of Stay: 0 days  Attendees: Patient:Henry Oconnell 09/04/2016 5:22 PM  Physician: Kristine LineaJolanta Pucilowska 09/04/2016 5:22 PM  Nursing: Hulan AmatoGwen Farrish, RN 09/04/2016 5:22 PM  RN Care Manager: 09/04/2016 5:22 PM  Social Worker: Jake SharkSara Treena Cosman, LCSW 09/04/2016 5:22 PM  Recreational Therapist: Hershal CoriaBeth Greene, LRT 09/04/2016 5:22 PM  Other:  09/04/2016 5:22 PM  Other:  09/04/2016 5:22 PM  Other: 09/04/2016 5:22 PM    Scribe for Treatment Team: Glennon MacSara P Blanka [place]olt, LCSW 09/04/2016 5:22 PM

## 2016-09-04 NOTE — Progress Notes (Signed)
Recreation Therapy Notes  INPATIENT RECREATION TR PLAN  Patient Details Name: ANH MANGANO MRN: [id number] DOB: Nov 08, 1950 Today's Date: 09/04/2016  Rec Therapy Plan Is patient appropriate for Therapeutic Recreation?: Yes Treatment times per week: At least once a week TR Treatment/Interventions: 1:1 session, Group participation (Comment) (Appropriate participation in daily recreational therapy tx)  Discharge Criteria Pt will be discharged from therapy if:: Treatment goals are met, Discharged Treatment plan/goals/alternatives discussed and agreed upon by:: Patient/family  Discharge Summary Short term goals set: See Care Plan Short term goals met: Complete Progress toward goals comments: One-to-one attended Which groups?: Wellness One-to-one attended: Stress management, coping skills Reason goals not met: N/A Therapeutic equipment acquired: None Reason patient discharged from therapy: Discharge from hospital Pt/family agrees with progress & goals achieved: Yes Date patient discharged from therapy: 09/04/16   Leonette Monarch, LRT.CTRS 09/04/2016, 4:16 PM

## 2016-09-04 NOTE — Discharge Summary (Signed)
Physician Discharge Summary Note  Patient:  Henry Oconnell is an 65 y.o., male MRN:  [id number] DOB:  05/05/1951 Patient phone:  [phone] (home)  Patient address:   [address] [place] [address],  Total Time spent with patient: 30 minutes  Date of Admission:  08/31/2016 Date of Discharge: 09/04/2016  Reason for Admission:  Suicidal ideation.  Patient is a 65 year old single African-American male from [place]. He was [facility] from [facility] department to our unit for further stabilization and treatment of worsening depression.  Patient states that he [facility] on December 7 requesting help for suicidal ideation. States that he was having thoughts about killing himself anyway he could. Patient says that his main stressor is his health condition. Patient states he's been diagnosed with HIV since the 90s. He says that for the last couple weeks he has not been taking medications because he just didn't feel like it.  Patient also has legal stressors. He was just released from prison in August. He is currently on probation.  Patient described his mood as irritable, denies problems with sleep, appetite, energy or concentration. He denies any hallucinations or homicidal ideation.  As far as substance abuse patient says he relapsed on cocaine/crack about a week ago. He had a past history of cocaine but had been sober since August. He also has a history of alcohol dependence. Says his only been drinking about 4 beers per week. Patient says that while in prison he completed the DART program. Patient smokes about one pack of cigarettes per week.  Associated Signs/Symptoms: Depression Symptoms:  depressed mood, hopelessness, recurrent thoughts of death, suicidal thoughts without plan, (Hypo) Manic Symptoms:  Impulsivity, Anxiety Symptoms:  Excessive Worry, Psychotic Symptoms:  denies PTSD Symptoms: Negative  Past  Psychiatric History: [place] and [place]icidality. He denies having any prior suicidal attempts but says that in the past he has tried to drink himself today and overdosed on cocaine.  Family Psychiatric  History: He denies any family history of mental illness, substance abuse or suicide  Social History: Patient is currently living with his brother in [place]. He is single, never married, has 2 children ages  As far as his legal history he had charges for breaking and entering. He violated probation and went to prison for 15 months. He was just released this past August. He continues to be on probation   Principal Problem: Major depressive disorder, recurrent severe without psychotic features [facility](HCC) Discharge Diagnoses: Patient Active Problem List   Diagnosis Date Noted  . Cocaine use disorder, moderate, dependence (HCC) [F14.20] 09/01/2016  . Major depressive disorder, recurrent severe without psychotic features (HCC) [F33.2] 08/31/2016  . Alcohol use disorder, moderate, dependence (HCC) [F10.20] 08/31/2016  . Suicidal ideation [R45.851] 08/31/2016  . Noncompliance [Z91.19] 08/31/2016  . Tobacco use disorder [F17.200] 08/31/2016  . Human immunodeficiency virus (HIV) disease (HCC) [B20] 08/31/2016  . Hepatitis C [B19.20] 08/31/2016  . Hepatitis B [B19.10] 08/31/2016    Past Medical History: Past Medical History:  Diagnosis Date  . Asthma   . COPD (chronic obstructive pulmonary disease) (HCC)   . Hep C w/ coma, chronic (HCC)   . HIV (human immunodeficiency virus infection) (HCC)   . Hypertension    History reviewed. No pertinent surgical history. Family History: History reviewed. No pertinent family history.  Social History:  History  Alcohol Use  .  2.4 oz/week  . 4 Cans of beer per week     History  Drug Use  . Types: Cocaine    Social History   Social History  . Marital  status: Single    Spouse name: N/A  . Number of children: N/A  . Years of education: N/A   Social History Main Topics  . Smoking status: Current Every Day Smoker    Packs/day: 0.25    Years: 15.00    Types: Cigarettes  . Smokeless tobacco: Never Used  . Alcohol use 2.4 oz/week    4 Cans of beer per week  . Drug use:     Types: Cocaine  . Sexual activity: No   Other Topics Concern  . None   Social History Narrative  . None    Hospital Course:    [name] is a 65 year old male with history of depression and substance abuse admitted for suicidal ideation.  1. Suicidal ideation. Resolved. The patient is able to contract for safety in the hospital. He is forward thinking and optimistic about the future.   2. Mood. We started Remeron for depression.   3. Smoking.  Nicotine patch was available.   4. Substance abuse. There were no symptoms of alcohol withdrawal. Vital signs were stable. The patient minimizes his problems and declines residential treatment.   5. HIV. The patient was noncompliant with treatment since release from jail in August. We restarted Genvoya for HIV. RPR negative. His CD4 is low at 0.20.  6. Poor appetite. We gave Ensure and Megace.  7. COPD. He was on inhaler.   8. Social. During incarceration, the patient lost Medicaid and disability. It is now reinstated. The patient has not been able to manage his money. We support a payee.  9. Disposition. He was discharged to home with his brother. He [facility] management and with his  ID doctor [facility].   Physical Findings: AIMS:  , ,  ,  ,    CIWA:    COWS:     Musculoskeletal: Strength & Muscle Tone: within normal limits Gait & Station: normal Patient leans: N/A  Psychiatric Specialty Exam: Physical Exam  Nursing note and vitals reviewed.   Review of Systems  Constitutional: Positive for weight loss.  Psychiatric/Behavioral: Positive for  substance abuse.  All other systems reviewed and are negative.   Blood pressure 122/65, pulse (!) 50, temperature 97.9 F (36.6 C), temperature source Oral, resp. rate 18, height 5\' 9"  (1.753 m), weight 54.4 kg (120 lb), SpO2 99 %.Body mass index is 17.72 kg/m.  General Appearance: Casual  Eye Contact:  Good  Speech:  Clear and Coherent  Volume:  Normal  Mood:  Euthymic  Affect:  Appropriate  Thought Process:  Goal Directed and Descriptions of Associations: Intact  Orientation:  Full (Time, Place, and Person)  Thought Content:  WDL  Suicidal Thoughts:  No  Homicidal Thoughts:  No  Memory:  Immediate;   Fair Recent;   Fair Remote;   Fair  Judgement:  Impaired  Insight:  Lacking  Psychomotor Activity:  Normal  Concentration:  Concentration: Fair and Attention Span: Fair  Recall:  [employer]Fair  Fund of Knowledge:  Fair  Language:  Fair  Akathisia:  No  Handed:  Right  AIMS (if indicated):     Assets:  Communication Skills Desire for Improvement Financial Resources/Insurance Housing Resilience Social Support  ADL's:  Intact  Cognition:  WNL  Sleep:  Number  of Hours: 8     Have you used any form of tobacco in the last 30 days? (Cigarettes, Smokeless Tobacco, Cigars, and/or Pipes): Yes  Has this patient used any form of tobacco in the last 30 days? (Cigarettes, Smokeless Tobacco, Cigars, and/or Pipes) Yes, Yes, A prescription for an FDA-approved tobacco cessation medication was offered at discharge and the patient refused  Blood Alcohol level:  No results found for: [facility]  Metabolic Disorder Labs:  Lab Results  Component Value Date   HGBA1C 5.5 09/01/2016   MPG 111 09/01/2016   No results found for: PROLACTIN Lab Results  Component Value Date   CHOL 175 09/01/2016   TRIG 73 09/01/2016   HDL 40 (L) 09/01/2016   CHOLHDL 4.4 09/01/2016   VLDL 15 09/01/2016   LDLCALC 120 (H) 09/01/2016    See Psychiatric Specialty Exam and Suicide Risk Assessment completed by Attending  Physician prior to discharge.  Discharge destination:  Home  Is patient on multiple antipsychotic therapies at discharge:  No   Has Patient had three or more failed trials of antipsychotic monotherapy by history:  No  Recommended Plan for Multiple Antipsychotic Therapies: NA  Discharge Instructions    Diet - low sodium heart healthy    Complete by:  As directed    Increase activity slowly    Complete by:  As directed        Medication List    TAKE these medications     Indication  albuterol 108 (90 Base) MCG/ACT inhaler Commonly known as:  PROVENTIL HFA;VENTOLIN HFA Inhale 1-2 puffs into the lungs every 6 (six) hours as needed for wheezing or shortness of breath.  Indication:  Chronic Obstructive Lung Disease   elvitegravir-cobicistat-emtricitabine-tenofovir 150-150-200-10 MG Tabs tablet Commonly known as:  GENVOYA Take 1 tablet by mouth daily with breakfast.  Indication:  HIV Disease   hydrocortisone 25 MG suppository Commonly known as:  ANUSOL-HC Place 1 suppository (25 mg total) rectally 2 (two) times daily.  Indication:  Inflamed Hemorrhoids   megestrol 400 MG/10ML suspension Commonly known as:  MEGACE Take 10 mLs (400 mg total) by mouth daily.  Indication:  General Weight Loss and Wasting   mirtazapine 15 MG tablet Commonly known as:  REMERON Take 1 tablet (15 mg total) by mouth at bedtime.  Indication:  Major Depressive Disorder      Follow-up Information    [facility] Go on 09/05/2016.   Why:  Please follow up 09/05/2016 at 1:45pm for outpatient services including medication management and outpatient therapy. Please bring insurance info, I.D., discharge summary and current medications.  Contact information: [address] [place] [address] [phone]           Follow-up recommendations:  Activity:  as tolerated. Diet:  low sodium heart healthy. Other:  keep follow up appointments.  Comments:    Signed: Kristine LineaJolanta  Keonda Dow, MD 09/04/2016, 8:44 AM

## 2016-09-04 NOTE — [facility] Group Notes (Signed)
[facility] LCSW Group Therapy   09/04/2016 9:30am   Type of Therapy: Group Therapy   Participation Level: Active   Participation Quality: Attentive, Sharing and Supportive   Affect: Appropriate  Cognitive: Alert and Oriented   Insight: Developing/Improving and Engaged   Engagement in Therapy: Developing/Improving and Engaged   Modes of Intervention: Clarification, Confrontation, Discussion, Education, Exploration,  Limit-setting, Orientation, Problem-solving, Rapport Building, Dance movement psychotherapisteality Testing, Socialization and Support  Summary of Progress/Problems: The topic for group therapy was feelings about diagnosis. Pt actively participated in group discussion on their past and current diagnosis and how they feel towards this. Pt also identified how society and family members judge them, based on their diagnosis as well as stereotypes and stigmas. Pt stated that his diagnosis is major depressive disorder. He identified feelings of being ashamed, determined, miserable, and hurt as how he feels about his diagnosis. He stated that he believes he should have his mental illness under control but it is complicated. 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     Hampton AbbotKadijah Tacora Athanas, MSW, LCSWA 09/04/2016, 11:09AM

## 2016-09-04 NOTE — Progress Notes (Signed)
D: Patient appears flat. Denies SI/HI/AVH. Minimal interaction with this staff member but pleasant during interaction. Visible in the milieu interacting with peers. No complaints at this time.  A: Medication given with education. Encouragement provided.  R: Patient was compliant with medication. He has remained calm and cooperative. Safety maintained with 15 min checks.

## 2016-12-17 DIAGNOSIS — B20 Human immunodeficiency virus [HIV] disease: Secondary | ICD-10-CM | POA: Diagnosis not present

## 2017-02-07 DIAGNOSIS — B20 Human immunodeficiency virus [HIV] disease: Secondary | ICD-10-CM | POA: Diagnosis not present

## 2017-03-15 DIAGNOSIS — B20 Human immunodeficiency virus [HIV] disease: Secondary | ICD-10-CM | POA: Diagnosis not present

## 2018-11-11 DIAGNOSIS — J189 Pneumonia, unspecified organism: Secondary | ICD-10-CM

## 2018-11-11 DIAGNOSIS — F1410 Cocaine abuse, uncomplicated: Secondary | ICD-10-CM

## 2018-11-12 DIAGNOSIS — F1410 Cocaine abuse, uncomplicated: Secondary | ICD-10-CM | POA: Diagnosis not present

## 2018-11-12 DIAGNOSIS — J189 Pneumonia, unspecified organism: Secondary | ICD-10-CM | POA: Diagnosis not present

## 2018-11-13 DIAGNOSIS — J189 Pneumonia, unspecified organism: Secondary | ICD-10-CM | POA: Diagnosis not present

## 2018-11-13 DIAGNOSIS — F1410 Cocaine abuse, uncomplicated: Secondary | ICD-10-CM | POA: Diagnosis not present

## 2019-07-05 ENCOUNTER — Encounter: Payer: Self-pay | Admitting: Cardiology

## 2019-07-05 ENCOUNTER — Inpatient Hospital Stay
Admission: AD | Admit: 2019-07-05 | Payer: Medicare Other | Source: Other Acute Inpatient Hospital | Admitting: Cardiovascular Disease

## 2019-07-05 NOTE — Progress Notes (Signed)
Received call from [facility] ED for transfer.  Patient is a 68 year old male with a history of HIV, hypertension, COPD, asthma who presented to the ED with fatigue.  No chest pain.  EKG showed sinus bradycardia with heart rate 45.  Blood pressure 143/62.  Labs notable for elevated troponin I with initial value 1.35.  Labs otherwise unremarkable, normal renal function hemoglobin, platelets.  He was started on aspirin and therapeutic Lovenox, transferring for NSTEMI.

## 2019-07-19 DIAGNOSIS — R4182 Altered mental status, unspecified: Secondary | ICD-10-CM

## 2019-07-19 DIAGNOSIS — R531 Weakness: Secondary | ICD-10-CM

## 2019-07-21 DIAGNOSIS — R4182 Altered mental status, unspecified: Secondary | ICD-10-CM | POA: Diagnosis not present

## 2019-07-21 DIAGNOSIS — R531 Weakness: Secondary | ICD-10-CM | POA: Diagnosis not present

## 2019-07-22 ENCOUNTER — Inpatient Hospital Stay (HOSPITAL_COMMUNITY)
Admission: AD | Admit: 2019-07-22 | Discharge: 2019-08-08 | DRG: 974 | Disposition: A | Discharge status: Hospice | Payer: Medicare Other | Source: Other Acute Inpatient Hospital | Attending: Internal Medicine | Admitting: Internal Medicine

## 2019-07-22 DIAGNOSIS — I2510 Atherosclerotic heart disease of native coronary artery without angina pectoris: Secondary | ICD-10-CM | POA: Diagnosis present

## 2019-07-22 DIAGNOSIS — Z8674 Personal history of sudden cardiac arrest: Secondary | ICD-10-CM

## 2019-07-22 DIAGNOSIS — R768 Other specified abnormal immunological findings in serum: Secondary | ICD-10-CM | POA: Diagnosis not present

## 2019-07-22 DIAGNOSIS — R1310 Dysphagia, unspecified: Secondary | ICD-10-CM | POA: Diagnosis present

## 2019-07-22 DIAGNOSIS — R64 Cachexia: Secondary | ICD-10-CM | POA: Diagnosis present

## 2019-07-22 DIAGNOSIS — R627 Adult failure to thrive: Secondary | ICD-10-CM | POA: Diagnosis present

## 2019-07-22 DIAGNOSIS — E162 Hypoglycemia, unspecified: Secondary | ICD-10-CM | POA: Diagnosis not present

## 2019-07-22 DIAGNOSIS — R4182 Altered mental status, unspecified: Secondary | ICD-10-CM

## 2019-07-22 DIAGNOSIS — G060 Intracranial abscess and granuloma: Secondary | ICD-10-CM | POA: Diagnosis present

## 2019-07-22 DIAGNOSIS — Z66 Do not resuscitate: Secondary | ICD-10-CM | POA: Diagnosis not present

## 2019-07-22 DIAGNOSIS — Z4659 Encounter for fitting and adjustment of other gastrointestinal appliance and device: Secondary | ICD-10-CM

## 2019-07-22 DIAGNOSIS — I609 Nontraumatic subarachnoid hemorrhage, unspecified: Secondary | ICD-10-CM | POA: Diagnosis present

## 2019-07-22 DIAGNOSIS — G9340 Encephalopathy, unspecified: Secondary | ICD-10-CM | POA: Diagnosis not present

## 2019-07-22 DIAGNOSIS — Z888 Allergy status to other drugs, medicaments and biological substances status: Secondary | ICD-10-CM

## 2019-07-22 DIAGNOSIS — F05 Delirium due to known physiological condition: Secondary | ICD-10-CM | POA: Diagnosis present

## 2019-07-22 DIAGNOSIS — Z20828 Contact with and (suspected) exposure to other viral communicable diseases: Secondary | ICD-10-CM | POA: Diagnosis present

## 2019-07-22 DIAGNOSIS — G9389 Other specified disorders of brain: Secondary | ICD-10-CM

## 2019-07-22 DIAGNOSIS — Z79899 Other long term (current) drug therapy: Secondary | ICD-10-CM

## 2019-07-22 DIAGNOSIS — J189 Pneumonia, unspecified organism: Secondary | ICD-10-CM

## 2019-07-22 DIAGNOSIS — I252 Old myocardial infarction: Secondary | ICD-10-CM

## 2019-07-22 DIAGNOSIS — R000 Tachycardia, unspecified: Secondary | ICD-10-CM | POA: Diagnosis not present

## 2019-07-22 DIAGNOSIS — B582 Toxoplasma meningoencephalitis: Secondary | ICD-10-CM | POA: Diagnosis not present

## 2019-07-22 DIAGNOSIS — Z515 Encounter for palliative care: Secondary | ICD-10-CM | POA: Diagnosis not present

## 2019-07-22 DIAGNOSIS — G0490 Encephalitis and encephalomyelitis, unspecified: Secondary | ICD-10-CM | POA: Diagnosis present

## 2019-07-22 DIAGNOSIS — R5381 Other malaise: Secondary | ICD-10-CM | POA: Diagnosis present

## 2019-07-22 DIAGNOSIS — R531 Weakness: Secondary | ICD-10-CM | POA: Diagnosis not present

## 2019-07-22 DIAGNOSIS — Z7902 Long term (current) use of antithrombotics/antiplatelets: Secondary | ICD-10-CM

## 2019-07-22 DIAGNOSIS — I959 Hypotension, unspecified: Secondary | ICD-10-CM | POA: Diagnosis not present

## 2019-07-22 DIAGNOSIS — Z881 Allergy status to other antibiotic agents status: Secondary | ICD-10-CM | POA: Diagnosis not present

## 2019-07-22 DIAGNOSIS — Z9114 Patient's other noncompliance with medication regimen: Secondary | ICD-10-CM

## 2019-07-22 DIAGNOSIS — Z7189 Other specified counseling: Secondary | ICD-10-CM

## 2019-07-22 DIAGNOSIS — I10 Essential (primary) hypertension: Secondary | ICD-10-CM | POA: Diagnosis present

## 2019-07-22 DIAGNOSIS — B182 Chronic viral hepatitis C: Secondary | ICD-10-CM | POA: Diagnosis present

## 2019-07-22 DIAGNOSIS — I214 Non-ST elevation (NSTEMI) myocardial infarction: Secondary | ICD-10-CM | POA: Diagnosis present

## 2019-07-22 DIAGNOSIS — G92 Toxic encephalopathy: Secondary | ICD-10-CM | POA: Diagnosis present

## 2019-07-22 DIAGNOSIS — D638 Anemia in other chronic diseases classified elsewhere: Secondary | ICD-10-CM | POA: Diagnosis present

## 2019-07-22 DIAGNOSIS — B1920 Unspecified viral hepatitis C without hepatic coma: Secondary | ICD-10-CM | POA: Diagnosis present

## 2019-07-22 DIAGNOSIS — J690 Pneumonitis due to inhalation of food and vomit: Secondary | ICD-10-CM | POA: Diagnosis not present

## 2019-07-22 DIAGNOSIS — J449 Chronic obstructive pulmonary disease, unspecified: Secondary | ICD-10-CM | POA: Diagnosis present

## 2019-07-22 DIAGNOSIS — Z23 Encounter for immunization: Secondary | ICD-10-CM | POA: Diagnosis not present

## 2019-07-22 DIAGNOSIS — Z781 Physical restraint status: Secondary | ICD-10-CM

## 2019-07-22 DIAGNOSIS — F1010 Alcohol abuse, uncomplicated: Secondary | ICD-10-CM | POA: Diagnosis present

## 2019-07-22 DIAGNOSIS — F17210 Nicotine dependence, cigarettes, uncomplicated: Secondary | ICD-10-CM | POA: Diagnosis present

## 2019-07-22 DIAGNOSIS — R0989 Other specified symptoms and signs involving the circulatory and respiratory systems: Secondary | ICD-10-CM

## 2019-07-22 DIAGNOSIS — B20 Human immunodeficiency virus [HIV] disease: Secondary | ICD-10-CM | POA: Diagnosis present

## 2019-07-22 DIAGNOSIS — B5889 Toxoplasmosis with other organ involvement: Principal | ICD-10-CM | POA: Diagnosis present

## 2019-07-22 DIAGNOSIS — F1990 Other psychoactive substance use, unspecified, uncomplicated: Secondary | ICD-10-CM | POA: Diagnosis present

## 2019-07-22 DIAGNOSIS — I5031 Acute diastolic (congestive) heart failure: Secondary | ICD-10-CM | POA: Diagnosis not present

## 2019-07-22 DIAGNOSIS — Z978 Presence of other specified devices: Secondary | ICD-10-CM | POA: Diagnosis not present

## 2019-07-23 ENCOUNTER — Inpatient Hospital Stay (HOSPITAL_COMMUNITY): Payer: Medicare Other

## 2019-07-23 ENCOUNTER — Encounter (HOSPITAL_COMMUNITY): Payer: Self-pay | Admitting: Internal Medicine

## 2019-07-23 DIAGNOSIS — I2510 Atherosclerotic heart disease of native coronary artery without angina pectoris: Secondary | ICD-10-CM

## 2019-07-23 DIAGNOSIS — Z7189 Other specified counseling: Secondary | ICD-10-CM

## 2019-07-23 DIAGNOSIS — G9340 Encephalopathy, unspecified: Secondary | ICD-10-CM | POA: Diagnosis present

## 2019-07-23 DIAGNOSIS — R531 Weakness: Secondary | ICD-10-CM

## 2019-07-23 DIAGNOSIS — G9389 Other specified disorders of brain: Secondary | ICD-10-CM

## 2019-07-23 LAB — CBC WITH DIFFERENTIAL/PLATELET
Abs Immature Granulocytes: 0.01 10*3/uL (ref 0.00–0.07)
Basophils Absolute: 0 10*3/uL (ref 0.0–0.1)
Basophils Relative: 0 %
Eosinophils Absolute: 0.1 10*3/uL (ref 0.0–0.5)
Eosinophils Relative: 2 %
HCT: 28 % — ABNORMAL LOW (ref 39.0–52.0)
Hemoglobin: 9.5 g/dL — ABNORMAL LOW (ref 13.0–17.0)
Immature Granulocytes: 0 %
Lymphocytes Relative: 46 %
Lymphs Abs: 2 10*3/uL (ref 0.7–4.0)
MCH: 32.5 pg (ref 26.0–34.0)
MCHC: 33.9 g/dL (ref 30.0–36.0)
MCV: 95.9 fL (ref 80.0–100.0)
Monocytes Absolute: 0.3 10*3/uL (ref 0.1–1.0)
Monocytes Relative: 7 %
Neutro Abs: 2 10*3/uL (ref 1.7–7.7)
Neutrophils Relative %: 45 %
Platelets: 129 10*3/uL — ABNORMAL LOW (ref 150–400)
RBC: 2.92 MIL/uL — ABNORMAL LOW (ref 4.22–5.81)
RDW: 13.4 % (ref 11.5–15.5)
WBC: 4.5 10*3/uL (ref 4.0–10.5)
nRBC: 0 % (ref 0.0–0.2)

## 2019-07-23 LAB — SARS CORONAVIRUS 2 (TAT 6-24 HRS): SARS Coronavirus 2: NEGATIVE

## 2019-07-23 LAB — COMPREHENSIVE METABOLIC PANEL
ALT: 25 U/L (ref 0–44)
AST: 33 U/L (ref 15–41)
Albumin: 2.5 g/dL — ABNORMAL LOW (ref 3.5–5.0)
Alkaline Phosphatase: 55 U/L (ref 38–126)
Anion gap: 7 (ref 5–15)
BUN: 6 mg/dL — ABNORMAL LOW (ref 8–23)
CO2: 23 mmol/L (ref 22–32)
Calcium: 8.3 mg/dL — ABNORMAL LOW (ref 8.9–10.3)
Chloride: 103 mmol/L (ref 98–111)
Creatinine, Ser: 0.73 mg/dL (ref 0.61–1.24)
GFR calc Af Amer: >60 mL/min (ref >60)
GFR calc non Af Amer: >60 mL/min (ref >60)
Glucose, Bld: 117 mg/dL — ABNORMAL HIGH (ref 70–99)
Potassium: 3.4 mmol/L — ABNORMAL LOW (ref 3.5–5.1)
Sodium: 133 mmol/L — ABNORMAL LOW (ref 135–145)
Total Bilirubin: 0.6 mg/dL (ref 0.3–1.2)
Total Protein: 8.4 g/dL — ABNORMAL HIGH (ref 6.5–8.1)

## 2019-07-23 LAB — GLUCOSE, CAPILLARY
Glucose-Capillary: 106 mg/dL — ABNORMAL HIGH (ref 70–99)
Glucose-Capillary: 115 mg/dL — ABNORMAL HIGH (ref 70–99)
Glucose-Capillary: 85 mg/dL (ref 70–99)
Glucose-Capillary: 89 mg/dL (ref 70–99)
Glucose-Capillary: 91 mg/dL (ref 70–99)
Glucose-Capillary: 93 mg/dL (ref 70–99)
Glucose-Capillary: 99 mg/dL (ref 70–99)

## 2019-07-23 LAB — T-HELPER CELLS (CD4) COUNT (NOT AT ARMC)
CD4 % Helper T Cell: 9 % — ABNORMAL LOW (ref 33–65)
CD4 T Cell Abs: 189 /uL — ABNORMAL LOW (ref 400–1790)

## 2019-07-23 LAB — MAGNESIUM: Magnesium: 1.4 mg/dL — ABNORMAL LOW (ref 1.7–2.4)

## 2019-07-23 LAB — TROPONIN I (HIGH SENSITIVITY)
Troponin I (High Sensitivity): 8 ng/L (ref <18)
Troponin I (High Sensitivity): 6 ng/L (ref <18)

## 2019-07-23 MED ORDER — ONDANSETRON HCL 4 MG PO TABS: 4.0000 mg | ORAL_TABLET | Freq: Four times a day (QID) | ORAL | Status: DC | PRN

## 2019-07-23 MED ORDER — ONDANSETRON HCL 4 MG/2ML IJ SOLN: 4.0000 mg | Freq: Four times a day (QID) | INTRAMUSCULAR | Status: DC | PRN

## 2019-07-23 MED ORDER — MAGNESIUM SULFATE 2 GM/50ML IV SOLN
2.0000 g | Freq: Once | INTRAVENOUS | Status: AC
  Administered 2019-07-23: 2 g via INTRAVENOUS
  Filled 2019-07-23: qty 50

## 2019-07-23 MED ORDER — POTASSIUM CHLORIDE CRYS ER 20 MEQ PO TBCR: 40.0000 meq | EXTENDED_RELEASE_TABLET | Freq: Once | ORAL | Status: DC

## 2019-07-23 MED ORDER — ACETAMINOPHEN 325 MG PO TABS
650.0000 mg | ORAL_TABLET | Freq: Four times a day (QID) | ORAL | Status: DC | PRN
  Administered 2019-07-24: 650 mg via ORAL
  Filled 2019-07-23: qty 2

## 2019-07-23 MED ORDER — THIAMINE HCL 100 MG/ML IJ SOLN
100.0000 mg | Freq: Every day | INTRAMUSCULAR | Status: DC
  Administered 2019-07-23: 100 mg via INTRAVENOUS
  Filled 2019-07-23 (×2): qty 2

## 2019-07-23 MED ORDER — DAPSONE 100 MG PO TABS
100.0000 mg | ORAL_TABLET | Freq: Every day | ORAL | Status: AC
  Administered 2019-07-23 – 2019-07-24 (×2): 100 mg via ORAL
  Filled 2019-07-23 (×2): qty 1

## 2019-07-23 MED ORDER — POTASSIUM CHLORIDE 10 MEQ/100ML IV SOLN
10.0000 meq | INTRAVENOUS | Status: AC
  Administered 2019-07-23 (×2): 10 meq via INTRAVENOUS
  Filled 2019-07-23 (×2): qty 100

## 2019-07-23 MED ORDER — HYDRALAZINE HCL 20 MG/ML IJ SOLN
5.0000 mg | INTRAMUSCULAR | Status: DC | PRN
  Filled 2019-07-23: qty 1

## 2019-07-23 MED ORDER — DEXTROSE-NACL 5-0.9 % IV SOLN
INTRAVENOUS | Status: AC
  Administered 2019-07-23 (×2): via INTRAVENOUS

## 2019-07-23 MED ORDER — ACETAMINOPHEN 650 MG RE SUPP: 650.0000 mg | Freq: Four times a day (QID) | RECTAL | Status: DC | PRN

## 2019-07-23 NOTE — Progress Notes (Signed)
EEG complete - results pending 

## 2019-07-23 NOTE — Consult Note (Signed)
Consultation Note Date: 07/23/2019   Patient Name: Henry Oconnell  DOB: [birth year]  MRN: [id number]  Age / Sex: 68 y.o., male  PCP: Patient, No Pcp Per Referring Physician: Darlin Drop, DO  Reason for Consultation: Establishing goals of care  HPI/Patient Profile: 68 y.o. male   admitted on 07/22/2019    Clinical Assessment and Goals of Care:  68 yo gentleman with history of HIV, history of recent NSTEMI, history of V fib arrest, poly substance use, admitted with altered mental status, has been transferred from [facility]. 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 Patient has been seen by SLP, has been advised Dysphagia 2 diet with thin liquids. He remains admitted to hospital medicine service, with input from cardiology, neurology as well as infectious diseases requested. Additionally, a palliative medicine consult has been requested for supportive care, ongoing goals of care discussions. Further imaging with CT chest/abd/pelvis has been requested, neurosurgery input has also been requested.   Patient is resting in bed, opens eyes, but doesn't verbalize with me, doesn't follow commands. No family at bedside. Call placed, unable to reach next of kin.   NEXT OF KIN  [name] Milley [phone].   SUMMARY OF RECOMMENDATIONS    remains full code, full scope for now. PMT to follow hospital course, disease trajectory and continue attempts for code status/goals of care discussions with patient if possible and with next of kin if possible.  Thank you for the consult.   Code Status/Advance Care Planning:  Full code    Symptom Management:    as above.   Palliative Prophylaxis:   Delirium Protocol  Additional Recommendations (Limitations, Scope, Preferences):  Full Scope Treatment  Psycho-social/Spiritual:   Desire for further Chaplaincy support:yes  Additional  Recommendations: Caregiving  Support/Resources  Prognosis:   Unable to determine  Discharge Planning: To Be Determined      Primary Diagnoses: Present on Admission: . Acute encephalopathy . Human immunodeficiency virus (HIV) disease (HCC) . Hepatitis C   I have reviewed the medical record, interviewed the patient and family, and examined the patient. The following aspects are pertinent.  Past Medical History:  Diagnosis Date  . Asthma   . COPD (chronic obstructive pulmonary disease) (HCC)   . Hep C w/ coma, chronic (HCC)   . HIV (human immunodeficiency virus infection) (HCC)   . Hypertension    Social History   Socioeconomic History  . Marital status: Divorced    Spouse name: Not on file  . Number of children: Not on file  . Years of education: Not on file  . Highest education level: Not on file  Occupational History  . Not on file  Social Needs  . Financial resource strain: Not on file  . Food insecurity    Worry: Not on file    Inability: Not on file  . Transportation needs    Medical: Not on file    Non-medical: Not on file  Tobacco Use  . Smoking status: Current Every Day Smoker  Packs/day: 0.25    Years: 15.00    Pack years: 3.75    Types: Cigarettes  . Smokeless tobacco: Never Used  Substance and Sexual Activity  . Alcohol use: Yes    Alcohol/week: 4.0 standard drinks    Types: 4 Cans of beer per week  . Drug use: Yes    Types: Cocaine  . Sexual activity: Never  Lifestyle  . Physical activity    Days per week: Not on file    Minutes per session: Not on file  . Stress: Not on file  Relationships  . Social Herbalist on phone: Not on file    Gets together: Not on file    Attends religious service: Not on file    Active member of club or organization: Not on file    Attends meetings of clubs or organizations: Not on file    Relationship status: Not on file  Other Topics Concern  . Not on file  Social History Narrative  . Not on  file   Family History  Family history unknown: Yes   Scheduled Meds: . thiamine injection  100 mg Intravenous Daily   Continuous Infusions: . dextrose 5 % and 0.9% NaCl 75 mL/hr at 07/23/19 0400   PRN Meds:.acetaminophen **OR** acetaminophen, hydrALAZINE, ondansetron **OR** ondansetron (ZOFRAN) IV Medications Prior to Admission:  Prior to Admission medications   Medication Sig Start Date End Date Taking? Authorizing Provider  albuterol (PROVENTIL HFA;VENTOLIN HFA) 108 (90 Base) MCG/ACT inhaler Inhale 1-2 puffs into the lungs every 6 (six) hours as needed for wheezing or shortness of breath. 09/03/16   Pucilowska, Wardell Honour, MD  elvitegravir-cobicistat-emtricitabine-tenofovir (GENVOYA) 150-150-200-10 MG TABS tablet Take 1 tablet by mouth daily with breakfast. 09/04/16   Pucilowska, Jolanta B, MD  hydrocortisone (ANUSOL-HC) 25 MG suppository Place 1 suppository (25 mg total) rectally 2 (two) times daily. 09/04/16   Pucilowska, Herma Ard B, MD  megestrol (MEGACE) 400 MG/10ML suspension Take 10 mLs (400 mg total) by mouth daily. 09/04/16   Pucilowska, Herma Ard B, MD  mirtazapine (REMERON) 15 MG tablet Take 1 tablet (15 mg total) by mouth at bedtime. 09/03/16   Pucilowska, Wardell Honour, MD   Allergies  Allergen Reactions  . Sulfamethoxazole Micro [Sulfamethoxazole] Rash  . Trimethoprim Rash   Review of Systems Doesn't verbalize with me  Physical Exam Patient resting in bed, has foley catheter With regular pattern of breathing S1 S2 Thin gentleman appears weak Abdomen not distended Doesn't awaken to gentle touch/voice.  In no distress currently  Vital Signs: BP (!) 156/72 (BP Location: Left Arm)   Pulse 72   Temp 98.2 F (36.8 C) (Oral)   Resp 18   SpO2 98%  Pain Scale: 0-10   Pain Score: 0-No pain   SpO2: SpO2: 98 % O2 Device:SpO2: 98 % O2 Flow Rate: .   IO: Intake/output summary:   Intake/Output Summary (Last 24 hours) at 07/23/2019 1520 Last data filed at 07/23/2019  0400 Gross per 24 hour  Intake 100.58 ml  Output -  Net 100.58 ml    LBM: Last BM Date: 07/23/19 Baseline Weight:   Most recent weight:       Palliative Assessment/Data:   PPS 30%  Time In:  1400 Time Out:  1500 Time Total:  60 min.  Greater than 50%  of this time was spent counseling and coordinating care related to the above assessment and plan.  Signed by: Loistine Chance, MD   Please contact Palliative Medicine Team phone  at [phone] for questions and concerns.  For individual provider: See Shea Evans

## 2019-07-23 NOTE — H&P (Signed)
History and Physical    Henry Oconnell QAS:[id number] DOB: 09/03/1951 DOA: 07/22/2019  PCP: Patient, No Pcp Per  Patient coming from: Patient was transferred from [facility].  Chief Complaint: Acute encephalopathy with abnormal MRI brain.  HPI: Henry Oconnell is a 68 y.o. male with history of HIV, recently admitted at [facility] for non-ST elevation MI managed conservatively, history of polysubstance abuse, history of VF arrest was recently admitted at [facility] 3 days ago for acute encephalopathy.  At the time work-up in the ER with CTA did not show anything acute.  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  Given this picture patient was planned to be transferred to [facility] for further infectious disease input and also may need more advanced care.  As per the patient's brother who talked with physicians at [facility] patient has not been taking his antiretrovirals for more than 7 months current CD4 count is unknown.  Patient was on Plavix aspirin and statin for his non-ST relation MI.  Patient also has severe protein calorie malnutrition.  Last labs done at [facility] showed hemoglobin of 11.2 platelets 134 creatinine 1 potassium 4.3.  MRI brain showed multiple masslike areas with a predilection for gray-white matter junction and the ventricles.  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  At the time of my exam patient is not in distress but is not oriented to his name place or person and does not follow commands.  Pupils are reacting to light.  All labs pending.  ED Course: Patient is a direct admit.  Review of Systems: As per HPI, rest all negative.   Past Medical History:  Diagnosis Date  . Asthma   . COPD (chronic obstructive pulmonary disease) (HCC)   . Hep C w/ coma, chronic (HCC)   . HIV  (human immunodeficiency virus infection) (HCC)   . Hypertension     History reviewed. No pertinent surgical history.   reports that he has been smoking cigarettes. He has a 3.75 pack-year smoking history. He has never used smokeless tobacco. He reports current alcohol use of about 4.0 standard drinks of alcohol per week. He reports current drug use. Drug: Cocaine.  Allergies  Allergen Reactions  . Sulfamethoxazole Micro [Sulfamethoxazole] Rash  . Trimethoprim Rash    Family History  Family history unknown: Yes    Prior to Admission medications   Medication Sig Start Date End Date Taking? Authorizing Provider  albuterol (PROVENTIL HFA;VENTOLIN HFA) 108 (90 Base) MCG/ACT inhaler Inhale 1-2 puffs into the lungs every 6 (six) hours as needed for wheezing or shortness of breath. 09/03/16   Pucilowska, Ellin Goodie, MD  elvitegravir-cobicistat-emtricitabine-tenofovir (GENVOYA) 150-150-200-10 MG TABS tablet Take 1 tablet by mouth daily with breakfast. 09/04/16   Pucilowska, Jolanta B, MD  hydrocortisone (ANUSOL-HC) 25 MG suppository Place 1 suppository (25 mg total) rectally 2 (two) times daily. 09/04/16   Pucilowska, Braulio Conte B, MD  megestrol (MEGACE) 400 MG/10ML suspension Take 10 mLs (400 mg total) by mouth daily. 09/04/16   Pucilowska, Braulio Conte B, MD  mirtazapine (REMERON) 15 MG tablet Take 1 tablet (15 mg total) by mouth at bedtime. 09/03/16   Pucilowska, Ellin Goodie, MD    Physical Exam: Constitutional: Moderately built poorly nourished. Vitals:   07/22/19 2200  BP: (!) 165/100  Pulse: 71  Resp: 18  Temp: 98.2 F (36.8 C)  TempSrc: Oral  SpO2: 91%   Eyes: Anicteric no pallor. ENMT: No discharge from the ears eyes nose or mouth. Neck: No mass felt.  No neck rigidity. Respiratory: No rhonchi or crepitations. Cardiovascular: S1-S2 heard. Abdomen: Soft nontender bowel sounds present. Musculoskeletal: No edema. Skin: Chronic skin changes. Neurologic: Patient is alert awake but not  oriented to his name place or person all does not follow commands.  Moves all extremities. Psychiatric: Appears confused.   Labs on Admission: I have personally reviewed following labs and imaging studies  CBC: No results for input(s): WBC, NEUTROABS, HGB, HCT, MCV, PLT in the last 168 hours. Basic Metabolic Panel: No results for input(s): NA, K, CL, CO2, GLUCOSE, BUN, CREATININE, CALCIUM, MG, PHOS in the last 168 hours. GFR: CrCl cannot be calculated (Patient's most recent lab result is older than the maximum 21 days allowed.). Liver Function Tests: No results for input(s): AST, ALT, ALKPHOS, BILITOT, PROT, ALBUMIN in the last 168 hours. No results for input(s): LIPASE, AMYLASE in the last 168 hours. No results for input(s): AMMONIA in the last 168 hours. Coagulation Profile: No results for input(s): INR, PROTIME in the last 168 hours. Cardiac Enzymes: No results for input(s): CKTOTAL, CKMB, CKMBINDEX, TROPONINI in the last 168 hours. BNP (last 3 results) No results for input(s): PROBNP in the last 8760 hours. HbA1C: No results for input(s): HGBA1C in the last 72 hours. CBG: Recent Labs  Lab 07/23/19 0032  GLUCAP 99   Lipid Profile: No results for input(s): CHOL, HDL, LDLCALC, TRIG, CHOLHDL, LDLDIRECT in the last 72 hours. Thyroid Function Tests: No results for input(s): TSH, T4TOTAL, FREET4, T3FREE, THYROIDAB in the last 72 hours. Anemia Panel: No results for input(s): VITAMINB12, FOLATE, FERRITIN, TIBC, IRON, RETICCTPCT in the last 72 hours. Urine analysis:    Component Value Date/Time   COLORURINE STRAW (A) 09/03/2016 2220   APPEARANCEUR CLEAR (A) 09/03/2016 2220   LABSPEC 1.002 (L) 09/03/2016 2220   PHURINE 8.0 09/03/2016 2220   GLUCOSEU NEGATIVE 09/03/2016 2220   HGBUR NEGATIVE 09/03/2016 2220   BILIRUBINUR NEGATIVE 09/03/2016 2220   KETONESUR NEGATIVE 09/03/2016 2220   PROTEINUR NEGATIVE 09/03/2016 2220   NITRITE NEGATIVE 09/03/2016 2220   LEUKOCYTESUR NEGATIVE  09/03/2016 2220   Sepsis Labs: @LABRCNTIP (procalcitonin:4,lacticidven:4) )No results found for this or any previous visit (from the past 240 hour(s)).   Radiological Exams on Admission: No results found.   Assessment/Plan Principal Problem:   Acute encephalopathy Active Problems:   Human immunodeficiency virus (HIV) disease (HCC)   Hepatitis C   Brain mass    1. 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  For now since patient is still confused we will keep patient n.p.o. get speech therapy evaluation and get infectious disease consult in the morning.  We will also check EEG. 2. HIV last CD4 count not known.  Will check CD4 count viral load.  As per the patient's brother will talk to the physicians at [facility] patient has not been taking his medications for last 7 months. 3. Severe protein calorie malnutrition will need nutrition input.  Presently n.p.o. we will get speech therapy consult. 4. History of recent non-ST relation MI admitted at [facility] was conservatively managed on aspirin Plavix and statins.  Note that patient is presently n.p.o. 5. History of VF arrest. 6. Hypertension we will keep patient on as needed IV hydralazine.  All labs including [id number] complete metabolic panel CBC are pending.  I tried to  reach patient's family but unable to reach with the number provided.   DVT prophylaxis: SCDs in anticipation of procedure. Code Status: Full code. Family Communication: Unable to reach family. Disposition Plan: To be determined. Consults called: None. Admission status: Inpatient.   Eduard ClosArshad N Bhavya Eschete MD [facility] Pager [phone].  If 7PM-7AM, please contact night-coverage [email or web address] Password [email or web address]  07/23/2019, 12:54 AM

## 2019-07-23 NOTE — Consult Note (Signed)
Chief Complaint   No chief complaint on file.   HPI   Consult requested by: Durwin Noraixon, ID Reason for consult: Brain lesions, possible biopsy  HPI: Fara BorosJulius B Mcneff is a 68 y.o. male with history of HIV, Hep C, HTN, NSTEMI (recent), MDD, polysubstance abuse who presented to [facility] ED several days ago due to altered mental status. Patient is encephalopathic so history is obtained via chart review. [facility] ED obtained CTA initially which was reportedly normal. Patient subsequently started on abx and brain MRI ordered. After MRI obtained, there were multiple mass like areas concerning for CNS lymphoma vs toxoplasmosis. Patient [facility] today for ID consultation. We were consulted by Infectious disease for consideration of brain biopsy.  Patient Active Problem List   Diagnosis Date Noted  . Acute encephalopathy 07/23/2019  . Brain mass 07/23/2019  . Cocaine use disorder, moderate, dependence (HCC) 09/01/2016  . Major depressive disorder, recurrent severe without psychotic features (HCC) 08/31/2016  . Alcohol use disorder, moderate, dependence (HCC) 08/31/2016  . Suicidal ideation 08/31/2016  . Noncompliance 08/31/2016  . Tobacco use disorder 08/31/2016  . Human immunodeficiency virus (HIV) disease (HCC) 08/31/2016  . Hepatitis C 08/31/2016  . Hepatitis B 08/31/2016    PMH: Past Medical History:  Diagnosis Date  . Asthma   . COPD (chronic obstructive pulmonary disease) (HCC)   . Hep C w/ coma, chronic (HCC)   . HIV (human immunodeficiency virus infection) (HCC)   . Hypertension     PSH: History reviewed. No pertinent surgical history.  Medications Prior to Admission  Medication Sig Dispense Refill Last Dose  . albuterol (PROVENTIL HFA;VENTOLIN HFA) 108 (90 Base) MCG/ACT inhaler Inhale 1-2 puffs into the lungs every 6 (six) hours as needed for wheezing or shortness of breath. 1 Inhaler 1   . elvitegravir-cobicistat-emtricitabine-tenofovir (GENVOYA) 150-150-200-10 MG  TABS tablet Take 1 tablet by mouth daily with breakfast. 30 tablet 1   . hydrocortisone (ANUSOL-HC) 25 MG suppository Place 1 suppository (25 mg total) rectally 2 (two) times daily. 12 suppository 0   . megestrol (MEGACE) 400 MG/10ML suspension Take 10 mLs (400 mg total) by mouth daily. 240 mL 0   . mirtazapine (REMERON) 15 MG tablet Take 1 tablet (15 mg total) by mouth at bedtime. 30 tablet 1     SH: Social History   Tobacco Use  . Smoking status: Current Every Day Smoker    Packs/day: 0.25    Years: 15.00    Pack years: 3.75    Types: Cigarettes  . Smokeless tobacco: Never Used  Substance Use Topics  . Alcohol use: Yes    Alcohol/week: 4.0 standard drinks    Types: 4 Cans of beer per week  . Drug use: Yes    Types: Cocaine    MEDS: Prior to Admission medications   Medication Sig Start Date End Date Taking? Authorizing Provider  albuterol (PROVENTIL HFA;VENTOLIN HFA) 108 (90 Base) MCG/ACT inhaler Inhale 1-2 puffs into the lungs every 6 (six) hours as needed for wheezing or shortness of breath. 09/03/16   Pucilowska, Ellin GoodieJolanta B, MD  elvitegravir-cobicistat-emtricitabine-tenofovir (GENVOYA) 150-150-200-10 MG TABS tablet Take 1 tablet by mouth daily with breakfast. 09/04/16   Pucilowska, Jolanta B, MD  hydrocortisone (ANUSOL-HC) 25 MG suppository Place 1 suppository (25 mg total) rectally 2 (two) times daily. 09/04/16   Pucilowska, Braulio ConteJolanta B, MD  megestrol (MEGACE) 400 MG/10ML suspension Take 10 mLs (400 mg total) by mouth daily. 09/04/16   Pucilowska, Ellin GoodieJolanta B, MD  mirtazapine (REMERON) 15 MG  tablet Take 1 tablet (15 mg total) by mouth at bedtime. 09/03/16   Pucilowska, Wardell Honour, MD    ALLERGY: Allergies  Allergen Reactions  . Sulfamethoxazole Micro [Sulfamethoxazole] Rash  . Trimethoprim Rash    Social History   Tobacco Use  . Smoking status: Current Every Day Smoker    Packs/day: 0.25    Years: 15.00    Pack years: 3.75    Types: Cigarettes  . Smokeless tobacco:  Never Used  Substance Use Topics  . Alcohol use: Yes    Alcohol/week: 4.0 standard drinks    Types: 4 Cans of beer per week     Family History  Family history unknown: Yes     ROS   ROS altered, unable to obtain  Exam   Vitals:   07/23/19 0859 07/23/19 1200  BP: (!) 158/70 (!) 156/72  Pulse: 68 72  Resp: 20 18  Temp: 98.3 F (36.8 C) 98.2 F (36.8 C)  SpO2: 95% 98%   General appearance: cachetic  Eyes: No scleral injection Cardiovascular: Regular rate and rhythm without murmurs, rubs, gallops. No edema or variciosities. Distal pulses normal. Pulmonary: Effort normal, non-labored breathing Musculoskeletal: does not cooperate with strength testing, does move all extremities well Neurological Mental Status:    - Patient is awake, oriented to self but not location, year, president, situtation    - Patient is unable to provide any history    - difficulties following commands Cranial Nerves: grossly normal although exam difficult to obtain Sensory: wd extremities to pain Unable to assess drift, gait  Results - Imaging/Labs   Results for orders placed or performed during the hospital encounter of 07/22/19 (from the past 48 hour(s))  Glucose, capillary     Status: None   Collection Time: 07/23/19 12:32 AM  Result Value Ref Range   Glucose-Capillary 99 70 - 99 mg/dL  Comprehensive metabolic panel     Status: Abnormal   Collection Time: 07/23/19  1:01 AM  Result Value Ref Range   Sodium 133 (L) 135 - 145 mmol/L   Potassium 3.4 (L) 3.5 - 5.1 mmol/L   Chloride 103 98 - 111 mmol/L   CO2 23 22 - 32 mmol/L   Glucose, Bld 117 (H) 70 - 99 mg/dL   BUN 6 (L) 8 - 23 mg/dL   Creatinine, Ser 0.73 0.61 - 1.24 mg/dL   Calcium 8.3 (L) 8.9 - 10.3 mg/dL   Total Protein 8.4 (H) 6.5 - 8.1 g/dL   Albumin 2.5 (L) 3.5 - 5.0 g/dL   AST 33 15 - 41 U/L   ALT 25 0 - 44 U/L   Alkaline Phosphatase 55 38 - 126 U/L   Total Bilirubin 0.6 0.3 - 1.2 mg/dL   GFR calc non Af Amer >60 >60 mL/min    GFR calc Af Amer >60 >60 mL/min   Anion gap 7 5 - 15    Comment: Performed at [facility], [address], [place], [place] [address]  CBC with Differential/Platelet     Status: Abnormal   Collection Time: 07/23/19  1:01 AM  Result Value Ref Range   WBC 4.5 4.0 - 10.5 K/uL   RBC 2.92 (L) 4.22 - 5.81 MIL/uL   Hemoglobin 9.5 (L) 13.0 - 17.0 g/dL   HCT 28.0 (L) 39.0 - 52.0 %   MCV 95.9 80.0 - 100.0 fL   MCH 32.5 26.0 - 34.0 pg   MCHC 33.9 30.0 - 36.0 g/dL   RDW 13.4 11.5 -  15.5 %   Platelets 129 (L) 150 - 400 K/uL   nRBC 0.0 0.0 - 0.2 %   Neutrophils Relative % 45 %   Neutro Abs 2.0 1.7 - 7.7 K/uL   Lymphocytes Relative 46 %   Lymphs Abs 2.0 0.7 - 4.0 K/uL   Monocytes Relative 7 %   Monocytes Absolute 0.3 0.1 - 1.0 K/uL   Eosinophils Relative 2 %   Eosinophils Absolute 0.1 0.0 - 0.5 K/uL   Basophils Relative 0 %   Basophils Absolute 0.0 0.0 - 0.1 K/uL   Immature Granulocytes 0 %   Abs Immature Granulocytes 0.01 0.00 - 0.07 K/uL    Comment: Performed at [facility], [address], [place], [place] [address]  T-helper cells (CD4) count (not at [facility])     Status: Abnormal   Collection Time: 07/23/19  1:01 AM  Result Value Ref Range   CD4 T Cell Abs 189 (L) 400 - 1,790 /uL   CD4 % Helper T Cell 9 (L) 33 - 65 %    Comment: Performed at [facility], [address] [place], [place] [address]  Troponin I (High Sensitivity)     Status: None   Collection Time: 07/23/19  1:01 AM  Result Value Ref Range   Troponin I (High Sensitivity) 8 <18 ng/L    Comment: (NOTE) Elevated high sensitivity troponin I (hsTnI) values and significant  changes across serial measurements may suggest ACS but many other  chronic and acute conditions are known to elevate hsTnI results.  Refer to the "Links" section for chest pain algorithms and additional  guidance. Performed at [facility], [address] [place], [place] [address]   Troponin I (High Sensitivity)      Status: None   Collection Time: 07/23/19  3:37 AM  Result Value Ref Range   Troponin I (High Sensitivity) 6 <18 ng/L    Comment: (NOTE) Elevated high sensitivity troponin I (hsTnI) values and significant  changes across serial measurements may suggest ACS but many other  chronic and acute conditions are known to elevate hsTnI results.  Refer to the "Links" section for chest pain algorithms and additional  guidance. Performed at [facility], [address] [place], [place] [address]   Magnesium     Status: Abnormal   Collection Time: 07/23/19  3:37 AM  Result Value Ref Range   Magnesium 1.4 (L) 1.7 - 2.4 mg/dL    Comment: Performed at [facility], [address], [place], [place] [address]  Glucose, capillary     Status: Abnormal   Collection Time: 07/23/19  4:57 AM  Result Value Ref Range   Glucose-Capillary 106 (H) 70 - 99 mg/dL  SARS CORONAVIRUS 2 (TAT 6-24 HRS) Nasopharyngeal Nasopharyngeal Swab     Status: None   Collection Time: 07/23/19  5:35 AM   Specimen: Nasopharyngeal Swab  Result Value Ref Range   SARS Coronavirus 2 NEGATIVE NEGATIVE    Comment: (NOTE) SARS-CoV-2 target nucleic acids are NOT DETECTED. The SARS-CoV-2 RNA is generally detectable in upper and lower respiratory specimens during the acute phase of infection. Negative results do not preclude SARS-CoV-2 infection, do not rule out co-infections with other pathogens, and should not be used as the sole basis for treatment or other patient management decisions. Negative results must be combined with clinical observations, patient history, and epidemiological information. The expected result is Negative. Fact Sheet for Patients: HairSlick.no Fact Sheet for Healthcare Providers:  quierodirigir.com This test is not yet approved or cleared by the [place] and  has been authorized for detection and/or diagnosis of SARS-CoV-2 by FDA  under an Emergency Use Authorization (EUA). This EUA will remain  in effect (meaning this test can be used) for the duration of the COVID-19 declaration under Section 56 4(b)(1) of the Act, 21 U.S.C. section 360bbb-3(b)(1), unless the authorization is terminated or revoked sooner. Performed at [facility], [address] [place], [place] [address]   Glucose, capillary     Status: None   Collection Time: 07/23/19  8:00 AM  Result Value Ref Range   Glucose-Capillary 85 70 - 99 mg/dL   Comment 1 Notify RN    Comment 2 Document in Chart   Glucose, capillary     Status: None   Collection Time: 07/23/19 11:40 AM  Result Value Ref Range   Glucose-Capillary 91 70 - 99 mg/dL   Comment 1 Notify RN    Comment 2 Document in Chart     Dg Chest Port 1 View  Result Date: 07/23/2019 CLINICAL DATA:  Altered mental status. EXAM: PORTABLE CHEST 1 VIEW COMPARISON:  July 19, 2019. FINDINGS: The heart size and mediastinal contours are within normal limits. Both lungs are clear. No pneumothorax or pleural effusion is noted. The visualized skeletal structures are unremarkable. IMPRESSION: No active disease. Electronically Signed   By: Lupita Raider M.D.   On: 07/23/2019 09:20    IMAGING: MRI from [place] reviewed on canopy. Significant patient artifact from motion. Multiple mass like areas seen in right medulla, left posterior pons, right ventral pons, bilateral cerebellum, and scattered in the bilateral juxtacortical white matter. There is confluent masslike appearance around the lateral ventricles, especially the right occipital horn and right frontal lobe, also involving the third ventricle extending into the right more than left midbrain.  Impression/Plan   68 y.o. male with acute encephalopathy with MRI findings concerning for lymphoma vs toxoplasmosis. I am unable to perform a thorough neuro exam due to encephalopathy, but he does move extremities.  After review with Dr Conchita Paris,  we do not believe patient needs a brain biopsy as of yet. Serum toxo still pending. If positive, this would likely confirm toxo and he will need treatment accordingly. If negative, could perform LP and send for PCR, etc. If diagnosis still inconclusive, could consider biopsy. Will await results of rec CT chest/abd/pelvis per ID as well.    Will follow from afar. Please call for any concerns.  Cindra Presume, PA-C [facility]

## 2019-07-23 NOTE — Procedures (Signed)
Patient Name: Henry Oconnell  MRN: [id number]  Epilepsy Attending: Lora Havens  Referring Physician/Provider: Dr Gean Birchwood Date: 07/23/2019 Duration: 25.08 mins  Patient history: 68 year old male with altered mental status.  EEG to evaluate for seizure.  Level of alertness: awake/lethargic, asleep  AEDs during EEG study: None  Technical aspects: This EEG study was done with scalp electrodes positioned according to the 10-20 International system of electrode placement. Electrical activity was acquired at a sampling rate of 500Hz  and reviewed with a high frequency filter of 70Hz  and a low frequency filter of 1Hz . EEG data were recorded continuously and digitally stored.   Description: During awake state, no clear background rhythm was seen.  EEG showed continuous generalized 3 to 6 Hz theta-delta slowing.  Hyperventilation and photic stimulation were not performed.  Abnormality -Continuous slow, generalized  IMPRESSION: This study is suggestive of moderate diffuse encephalopathy, nonspecific to etiology. No seizures or epileptiform discharges were seen throughout the recording.

## 2019-07-23 NOTE — Progress Notes (Signed)
Henry Oconnell is a 69 y.o. male with history of HIV, recently admitted at [facility] for non-ST elevation MI managed conservatively, history of polysubstance abuse, history of VF arrest was recently admitted at [facility] 3 days ago for acute encephalopathy.  At the time work-up in the ER with CTA did not show anything acute.  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  Given this picture patient was planned to be transferred to [facility] for further infectious disease input and also may need more advanced care.  As per the patient's brother who talked with physicians at [facility] patient has not been taking his antiretrovirals for more than 7 months current CD4 count is unknown.  Patient was on Plavix aspirin and statin for his non-ST relation MI.  Patient also has severe protein calorie malnutrition.  Last labs done at [facility] showed hemoglobin of 11.2 platelets 134 creatinine 1 potassium 4.3.  MRI brain showed multiple masslike areas with a predilection for gray-white matter junction and the ventricles.  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  07/23/19: Patient was seen and examined at his bedside this morning.  He is still altered and not following commands.  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  Infectious disease consulted to further assess.    Lab studies revealed some electrolyte abnormalities that are being repleted.  CD4 count pending.   Recent NSTEMI with hospitalization at [facility] that has been treated conservatively, history of VF arrest.  Will consult cardiology.  Please refer to H&P dictated by my partner Dr. Hal Hope on 07/23/2019 for further details of the assessment and plan.

## 2019-07-23 NOTE — Consult Note (Signed)
Cardiology Consultation:   Patient ID: SENG LARCH MRN: [id number]; DOB: November 19, 1950  Admit date: 07/22/2019 Date of Consult: 07/23/2019  Primary Care Provider: Patient, No Pcp Per Primary Cardiologist: No primary care provider on file.  Primary Electrophysiologist:  None    Patient Profile:   Henry Oconnell is a 68 y.o. male with a hx of hypertension, HIV positive, hepatitis C, tobacco abuse, COPD, VF arrest 2018, IV drug use and recent MI treated medically who is being seen today for the evaluation of NSTEMI at the request of Dr. Margo Aye.  History of Present Illness:   Henry Oconnell has a medical history as above. He has a history of cardiac arrest (Vfib) and resuscitation in 2018. He was positive for cocaine at that time. Also notes indicate reported MI ~6 months ago with hospitalization for 2 weeks, unknown treatment/intervention.   The patient was admitted to the hospital 10/11/20120-07/08/2019 for chest pain and was positive for NSTEMI. He was having chest pain and weakness. Notes from that admission reviewed and he was noted to be bradycardic with peaked T waves in V3-V5. Troponin up to 1017. Also at some point had junctional rhythm, 45 bpm. Echocardiogram showed normal LV function with EF 55-60% with no wall motion abnormalities. He was treated with heparin for 48 hours and due to his non-compliance it was felt that it was most appropriate to treat medically. He was loaded with Plavix and continued on high intensity statin. He was not placed on any further GDMT for NSTEMI due to low blood pressure, borderline bradycardia and concern for non-compliance.   The patient was admitted to [facility] ~4 days ago with acute encephalopathy. Initial CTA was non acute. 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 He was transferred to [facility] for further infectious disease input. Apparently the patient's  brother reported to physicians at [facility] that the patient had not been taking his antivirals for more than 7 months. He was on plavix, aspirin and statin for NSTEMI. Pt also has severe malnutrition. Last labs done at [facility] showed hemoglobin of 11.2 platelets 134 creatinine 1 potassium 4.3.  MRI brain showed multiple masslike areas with a predilection for gray-white matter junction and the ventricles.  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  Troponins were negative X3 at [facility] and BNP was normal at 89.   Findings here at [facility]: High sensitivity troponins today are low and flat- 8,6.  EKG- NSR 65 bpm with tall T waves, no ischemic changes CXR- No active disease COVID 19 test in process  On my exam the patient was sleeping and I roused him. He is minimally conversant. He is oriented to his name but unable to tell me where he is. He says no when I ask if he is in the hospital. He denies any pain or chest pain. He does not take deep breath when I instruct him for exam. He appears very thin.   Heart Pathway Score:     Past Medical History:  Diagnosis Date   Asthma    COPD (chronic obstructive pulmonary disease) (HCC)    Hep C w/ coma, chronic (HCC)    HIV (human immunodeficiency virus infection) (HCC)    Hypertension     History reviewed. No pertinent surgical history.   Home Medications:  Prior to Admission medications   Medication Sig Start Date End Date Taking? Authorizing Provider  albuterol (  PROVENTIL HFA;VENTOLIN HFA) 108 (90 Base) MCG/ACT inhaler Inhale 1-2 puffs into the lungs every 6 (six) hours as needed for wheezing or shortness of breath. 09/03/16   Pucilowska, Ellin GoodieJolanta B, MD  elvitegravir-cobicistat-emtricitabine-tenofovir (GENVOYA) 150-150-200-10 MG TABS tablet Take 1 tablet by mouth daily with breakfast. 09/04/16   Pucilowska, Jolanta B, MD  hydrocortisone (ANUSOL-HC) 25 MG suppository Place 1 suppository (25 mg total)  rectally 2 (two) times daily. 09/04/16   Pucilowska, Braulio ConteJolanta B, MD  megestrol (MEGACE) 400 MG/10ML suspension Take 10 mLs (400 mg total) by mouth daily. 09/04/16   Pucilowska, Braulio ConteJolanta B, MD  mirtazapine (REMERON) 15 MG tablet Take 1 tablet (15 mg total) by mouth at bedtime. 09/03/16   Shari ProwsPucilowska, Jolanta B, MD    Inpatient Medications: Scheduled Meds:  thiamine injection  100 mg Intravenous Daily   Continuous Infusions:  dextrose 5 % and 0.9% NaCl 75 mL/hr at 07/23/19 0400   PRN Meds: acetaminophen **OR** acetaminophen, hydrALAZINE, ondansetron **OR** ondansetron (ZOFRAN) IV  Allergies:    Allergies  Allergen Reactions   Sulfamethoxazole Micro [Sulfamethoxazole] Rash   Trimethoprim Rash    Social History:   Social History   Socioeconomic History   Marital status: Divorced    Spouse name: Not on file   Number of children: Not on file   Years of education: Not on file   Highest education level: Not on file  Occupational History   Not on file  Social Needs   Financial resource strain: Not on file   Food insecurity    Worry: Not on file    Inability: Not on file   Transportation needs    Medical: Not on file    Non-medical: Not on file  Tobacco Use   Smoking status: Current Every Day Smoker    Packs/day: 0.25    Years: 15.00    Pack years: 3.75    Types: Cigarettes   Smokeless tobacco: Never Used  Substance and Sexual Activity   Alcohol use: Yes    Alcohol/week: 4.0 standard drinks    Types: 4 Cans of beer per week   Drug use: Yes    Types: Cocaine   Sexual activity: Never  Lifestyle   Physical activity    Days per week: Not on file    Minutes per session: Not on file   Stress: Not on file  Relationships   Social connections    Talks on phone: Not on file    Gets together: Not on file    Attends religious service: Not on file    Active member of club or organization: Not on file    Attends meetings of clubs or organizations: Not on  file    Relationship status: Not on file   Intimate partner violence    Fear of current or ex partner: Not on file    Emotionally abused: Not on file    Physically abused: Not on file    Forced sexual activity: Not on file  Other Topics Concern   Not on file  Social History Narrative   Not on file    Family History:    Family History  Family history unknown: Yes     ROS:  Please see the history of present illness.   All other ROS reviewed and negative.     Physical Exam/Data:   Vitals:   07/22/19 2200 07/23/19 0000 07/23/19 0400 07/23/19 0859  BP: (!) 165/100 (!) 170/87 (!) 172/85 (!) 158/70  Pulse: 71 67 66 68  Resp: 18 18 18 20   Temp: 98.2 F (36.8 C)   98.3 F (36.8 C)  TempSrc: Oral   Oral  SpO2: 91% 92% 94% 95%    Intake/Output Summary (Last 24 hours) at 07/23/2019 1239 Last data filed at 07/23/2019 0400 Gross per 24 hour  Intake 100.58 ml  Output --  Net 100.58 ml   No flowsheet data found.   There is no height or weight on file to calculate BMI.  General:  Thin male, in no acute distress HEENT: normal except for presence of arcus senilis Neck: no JVD Endocrine:  No thryomegaly Vascular: No carotid bruits; FA pulses 2+ bilaterally  Cardiac:  normal S1, S2; RRR; no murmur  Lungs:  clear to auscultation bilaterally, no wheezing, few scattered rhonchi Abd: soft, nontender Ext: no edema Musculoskeletal:  No deformities, BUE and BLE strength normal and equal Skin: warm and dry  Neuro:  CNs 2-12 intact, no focal abnormalities noted Psych:  Normal affect   EKG:  The EKG was personally reviewed and demonstrates:   NSR 65 bpm with tall T waves, no ischemic changes Telemetry:  Telemetry was personally reviewed and demonstrates:  Sinus rhythm in the 60's  Relevant CV Studies:  Echocardiogram  07/06/2019 SUMMARY The left ventricular size is normal. There is normal left ventricular wall thickness.  Left ventricular systolic function is normal. LV  ejection fraction = 55-60%. The right ventricle is normal in size and function. There is aortic valve sclerosis. There is no significant valvular stenosis or regurgitation. The IVC is normal in size with an inspiratory collapse of greater then 50%,  suggesting normal right atrial pressure. There is no pericardial effusion. There is no comparison study available.   Laboratory Data:  High Sensitivity Troponin:   Recent Labs  Lab 07/23/19 0101 07/23/19 0337  TROPONINIHS 8 6     Chemistry Recent Labs  Lab 07/23/19 0101  NA 133*  K 3.4*  CL 103  CO2 23  GLUCOSE 117*  BUN 6*  CREATININE 0.73  CALCIUM 8.3*  GFRNONAA >60  GFRAA >60  ANIONGAP 7    Recent Labs  Lab 07/23/19 0101  PROT 8.4*  ALBUMIN 2.5*  AST 33  ALT 25  ALKPHOS 55  BILITOT 0.6   Hematology Recent Labs  Lab 07/23/19 0101  WBC 4.5  RBC 2.92*  HGB 9.5*  HCT 28.0*  MCV 95.9  MCH 32.5  MCHC 33.9  RDW 13.4  PLT 129*   BNPNo results for input(s): BNP, PROBNP in the last 168 hours.  DDimer No results for input(s): DDIMER in the last 168 hours.   Radiology/Studies:  Dg Chest Port 1 View  Result Date: 07/23/2019 CLINICAL DATA:  Altered mental status. EXAM: PORTABLE CHEST 1 VIEW COMPARISON:  July 19, 2019. FINDINGS: The heart size and mediastinal contours are within normal limits. Both lungs are clear. No pneumothorax or pleural effusion is noted. The visualized skeletal structures are unremarkable. IMPRESSION: No active disease. Electronically Signed   By: July 21, 2019 M.D.   On: 07/23/2019 09:20    Assessment and Plan:   CAD -Pt s/p NSTEMI 07/05/2019 treated at [facility], Troponin up to 1017. He was treated with heparin for 48 hours and due to his non-compliance it was felt that it was most appropriate to treat medically. Echocardiogram showed normal LV function with EF 55-60% with no wall motion abnormalities. He was loaded with Plavix and continued on high intensity statin. He was not placed  on any further GDMT  for NSTEMI due to low blood pressure, borderline bradycardia and concern for non-compliance.  -Currently admitted with encephalopathy. -Troponins were negative X 3 at [facility]. -High sensitivity troponins today are low and flat- 8,6.  -EKG- NSR 65 bpm with tall T waves, no ischemic changes -Pt minimally interactive, not oriented and not cooperative. He denies chest pain.  -There is no clinical evidence of acute cardiac process at this time. No need for further workup at this time. -Continue aspirin, plavix, statin when able to take oral medications and OK with ID/neurology (possible brain biopsy planned).   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 -Neurology on board: EEG suggestive of moderate diffuse encephalopathy, nonspecific to etiology. No seizures noted.  -Per ID, plan for possible stereotactic brain biopsy.   HIV -Pt has been reportedly off antiretrovirals. Infectious disease is consulting.   Polysubstance abuse -Hx of substance abuse. UDS from [place] negative.  Tobacco abuse -Hx of smoking. Pt unable to provide information at this time.  -With his recent MI, would advise on smoking cessation once pt able to interact in a meaningful way.   For questions or updates, please contact [place] Please consult [email or web address] for contact info under     Signed, Daune Perch, NP  07/23/2019 12:39 PM

## 2019-07-23 NOTE — Consult Note (Signed)
[facility]    Date of Admission:  07/22/2019     Total days of antibiotics 0                Reason for Consult: Uncontrolled HIV, Multiple brain lesions    Referring Provider: Margo Aye Primary Care Provider: Patient, No Pcp Per    Assessment: Henry Oconnell is a 68 y.o. male with HIV (reportedly non-adherent x 24m, last known CD4 in 2017 155) now with 3 week history of erratic behavior, intermittently fluctuating mental status changes and now encephalopathic and minimally responsive. MRI W and WO contrast revealed multiple scattered enhancing lesions bilaterally with concern over toxoplasmosis vs lymphoma.  He will need neurosurgery consult for consideration of stereotactic brain biopsy of these lesions for definitive course of therapy. Dr. Drue Second discussed with radiology @ [facility] where scan images are and there may be a lesion at the right parietal lobe large enough amenable to this. On exam he is non verbal but moves all extremities. No rashes and no significant lymphadenopathy appreciated.   Would hold Biktarvy at this time until we get more information as re-introduction of ARV in the setting of untreated toxoplasmosis could potentially lead to lethal swelling of the brain if he were to have IRIS develop.   Will add Hep C VL, RPR, Quantiferon, Toxo IgM/IgG to blood work. CD4 and VL are still pending however given his poor counts in 2017 and history of being off meds I suspect these are still quite low.    Plan: 1. Please consult neurosurgery for consideration of stereotactic brain biopsy 2. May need to get images from [facility] loaded into our system vs repeat here 3. EEG pending 4. Hold Biktarvy for now 5. Serum Toxo Ag, RPR, Quantiferon, Hep C RNA 6. VL/CD4 already pending      Principal Problem:   Acute encephalopathy Active Problems:   Human immunodeficiency virus (HIV) disease (HCC)   Hepatitis C   Brain mass   . thiamine injection   100 mg Intravenous Daily    HPI: Henry Oconnell is a 68 y.o. male with history of HIV, Hep C, tobacco use, cocaine use, asthma, HTN, NSTEMI depression.   Chart Review from [facility]:  Presented to ED in [place] with mental status changes and weakness. Apparently he waxed and waned his mental status and ability to answer questions correctly or babbling with incoherent speech. Per the patient's brother he has had AMS intermittently x 3 weeks now with poor self care. Lives with his brother and has sister checking in with him periodically. Patient in the ED reportedly denied headache, blurred vision, dizziness, fevers, chills, SOB, cough, CP, N/V. CT scan of the head 10/11 no acute abnormality. NSTEMI 2 weeks ago and was treated at [facility].   [facility] work up - CXR unremarkable, NCHCT no acute findings, MRI head w/ w/o contrast - multiple masslike areas seen in the right medulla, left posterior pons, right ventral pons, b/l cerebellum and scattered in juxtacortical white matter. Confluent masslike appearance around the lateral ventricles esp R occipital horn and R frontal lobe. No ventricular debris. +Lesion enhancement along lateral ventricles. Radiology impression favored lymphoma but toxoplasmosis was also consideration. Very limited d/t motion degredation.   HIV care through [place]. Was on Biktarvy but family reported not taking x 7 months. Last CD4 I see in our system was 155 in 2017; seems that he has been out of care and likely  off ARVs longer. Chart reports history of current cocaine use and tobacco smoker per his brother. Unclear if his Hep C has been treated.   Review of Systems: Review of Systems  Unable to perform ROS: Patient nonverbal    Past Medical History:  Diagnosis Date  . Asthma   . COPD (chronic obstructive pulmonary disease) ([place])   . Hep C w/ coma, chronic ([place])   . HIV (human immunodeficiency virus infection) ([place])   . Hypertension     Social History   Tobacco  Use  . Smoking status: Current Every Day Smoker    Packs/day: 0.25    Years: 15.00    Pack years: 3.75    Types: Cigarettes  . Smokeless tobacco: Never Used  Substance Use Topics  . Alcohol use: Yes    Alcohol/week: 4.0 standard drinks    Types: 4 Cans of beer per week  . Drug use: Yes    Types: Cocaine    Family History  Family history unknown: Yes   Allergies  Allergen Reactions  . Sulfamethoxazole Micro [Sulfamethoxazole] Rash  . Trimethoprim Rash    OBJECTIVE: Blood pressure (!) 158/70, pulse 68, temperature 98.3 F (36.8 C), temperature source Oral, resp. rate 20, SpO2 95 %.  Physical Exam Vitals signs and nursing note reviewed.  Constitutional:      Appearance: He is ill-appearing.     Comments: Resting in bed. Non-verbal. Not participating. No distress. Cachectic and chronically ill-appearing.   HENT:     Mouth/Throat:     Pharynx: Oropharyngeal exudate present.     Comments: Dry lips. Would not open mouth to allow exam.  Eyes:     Pupils: Pupils are equal, round, and reactive to light.     Comments: Forcibly trying to shut eyes during assessment.   Neck:     Musculoskeletal: No neck rigidity.  Cardiovascular:     Rate and Rhythm: Normal rate and regular rhythm.     Heart sounds: No murmur.  Pulmonary:     Effort: Pulmonary effort is normal. No respiratory distress.     Breath sounds: Normal breath sounds.  Abdominal:     General: Bowel sounds are normal. There is no distension.     Tenderness: There is no abdominal tenderness.  Musculoskeletal:        General: No swelling, tenderness or deformity.  Lymphadenopathy:     Cervical: No cervical adenopathy.     Upper Body:     Right upper body: No supraclavicular or axillary adenopathy.     Left upper body: No supraclavicular or axillary adenopathy.  Skin:    General: Skin is warm and dry.     Capillary Refill: Capillary refill takes less than 2 seconds.     Findings: Rash: Flat macular lesions  overlying lower extremeties.   Neurological:     Comments: Nonverbal. Unable to meaningfully assess.   Psychiatric:     Comments: Nonverbal. Unable to meaningfully assess.      Lab Results Lab Results  Component Value Date   WBC 4.5 07/23/2019   HGB 9.5 (L) 07/23/2019   HCT 28.0 (L) 07/23/2019   MCV 95.9 07/23/2019   PLT 129 (L) 07/23/2019    Lab Results  Component Value Date   CREATININE 0.73 07/23/2019   BUN 6 (L) 07/23/2019   NA 133 (L) 07/23/2019   K 3.4 (L) 07/23/2019   CL 103 07/23/2019   CO2 23 07/23/2019    Lab Results  Component Value Date  ALT 25 07/23/2019   AST 33 07/23/2019   ALKPHOS 55 07/23/2019   BILITOT 0.6 07/23/2019     Microbiology: No results found for this or any previous visit (from the past 240 hour(s)).   Rexene AlbertsStephanie Dixon, MSN, NP-C [facility]  [email or web address] Pager: [phone] Office: [phone] [facility] Main Line: [phone]   07/23/2019 10:08 AM

## 2019-07-23 NOTE — Evaluation (Signed)
Clinical/Bedside Swallow Evaluation Patient Details  Name: Henry Oconnell MRN: [id number] Date of Birth: 1951/09/24  Today's Date: 07/23/2019 Time: SLP Start Time (ACUTE ONLY): 1155 SLP Stop Time (ACUTE ONLY): 1218 SLP Time Calculation (min) (ACUTE ONLY): 23 min  Past Medical History:  Past Medical History:  Diagnosis Date  . Asthma   . COPD (chronic obstructive pulmonary disease) ([place])   . Hep C w/ coma, chronic ([place])   . HIV (human immunodeficiency virus infection) ([place])   . Hypertension    Past Surgical History: History reviewed. No pertinent surgical history. HPI:  Henry Oconnell, 68 y/m, transfered from [facility] for infectious disease input and possible advanced care. PMH: h/o HIV,  non-ST elevation MI, poly substance abuse, VF arrest and recent admission to [facility]. for acute encephalopathy. 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 No previous ST services.    Assessment / Plan / Recommendation Clinical Impression  Patient was seen for clinical swallow assessement. When entering room, pt was trying to get out of bed and was stating he needs to leave. Pt was effectivley calmed and oriented to place. After repositioning in bed, he agreed to take po trials for assessment. Oral motor exam revealed an overall oral weakness. Trials of ice chips, water by cup and straw, applesauce and cracker were administered by SLP. Pt unable to feed himself due to poor coordination of hand to mouth as well as poor vision. He tolerated liquids and purees with good oral transfer with no s/sx of aspiration. No throat clear or cough present and maintaining clear vocal quality. Pt had prolonged mastication with cracker with reduced awarenss of bolus. Suspect dyspagia is related to confusion and deconditioning. Recommend Dys 2 diet with thin liquids, medication whole in puree. Full assistance for meals, feed slowly with small sips and bites. ST  to f/u for diet tolerance and diet upgrade.  SLP Visit Diagnosis: Dysphagia, oral phase (R13.11)    Aspiration Risk  Mild aspiration risk    Diet Recommendation Dysphagia 2 (Fine chop);Thin liquid   Liquid Administration via: Cup;Straw Medication Administration: Whole meds with puree Supervision: (total assistance needed) Compensations: Minimize environmental distractions;Slow rate;Small sips/bites Postural Changes: Seated upright at 90 degrees    Other  Recommendations Oral Care Recommendations: Oral care BID   Follow up Recommendations        Frequency and Duration min 1 x/week  2 weeks       Prognosis Prognosis for Safe Diet Advancement: Good Barriers to Reach Goals: Cognitive deficits;Severity of deficits      Swallow Study   General Date of Onset: 07/23/19 HPI: Henry Henry Oconnell, 68 y/m, transfered from [facility] for infectious disease input and possible advanced care. PMH: h/o HIV,  non-ST elevation MI, poly substance abuse, VF arrest and recent admission to [facility]. for acute encephalopathy. 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 No previous ST services.  Type of Study: Bedside Swallow Evaluation Previous Swallow Assessment: none Diet Prior to this Study: NPO Temperature Spikes Noted: No Respiratory Status: Room air History of Recent Intubation: No Behavior/Cognition: Alert;Agitated;Requires cueing Oral Cavity Assessment: Dry Oral Care Completed by SLP: Yes Oral Cavity - Dentition: Missing dentition Vision: Functional for self-feeding Self-Feeding Abilities: Total assist Patient Positioning: Upright in bed Baseline Vocal Quality: Normal Volitional Cough: Weak Volitional Swallow: Unable to elicit    Oral/Motor/Sensory Function Overall Oral Motor/Sensory Function: Generalized oral weakness Facial ROM: Within  Functional Limits Facial Symmetry: Within Functional Limits Facial Strength: Within  Functional Limits Facial Sensation: Within Functional Limits Lingual ROM: Within Functional Limits Lingual Symmetry: Within Functional Limits Lingual Strength: Within Functional Limits Lingual Sensation: Within Functional Limits   Ice Chips Ice chips: Within functional limits Presentation: Spoon   Thin Liquid Thin Liquid: Within functional limits Presentation: Cup;Straw    Nectar Thick Nectar Thick Liquid: Not tested   Honey Thick Honey Thick Liquid: Not tested   Puree Puree: Within functional limits Presentation: Spoon   Solid     Solid: Impaired Oral Phase Impairments: Impaired mastication      Lindalou Hose Phinneas Shakoor, MA, CCC-SLP 07/23/2019 2:03 PM

## 2019-07-24 ENCOUNTER — Inpatient Hospital Stay (HOSPITAL_COMMUNITY): Payer: Medicare Other

## 2019-07-24 DIAGNOSIS — Z7189 Other specified counseling: Secondary | ICD-10-CM

## 2019-07-24 DIAGNOSIS — B582 Toxoplasma meningoencephalitis: Secondary | ICD-10-CM

## 2019-07-24 DIAGNOSIS — Z515 Encounter for palliative care: Secondary | ICD-10-CM

## 2019-07-24 LAB — BASIC METABOLIC PANEL
Anion gap: 5 (ref 5–15)
BUN: 5 mg/dL — ABNORMAL LOW (ref 8–23)
CO2: 23 mmol/L (ref 22–32)
Calcium: 7.9 mg/dL — ABNORMAL LOW (ref 8.9–10.3)
Chloride: 102 mmol/L (ref 98–111)
Creatinine, Ser: 0.71 mg/dL (ref 0.61–1.24)
GFR calc Af Amer: >60 mL/min (ref >60)
GFR calc non Af Amer: >60 mL/min (ref >60)
Glucose, Bld: 116 mg/dL — ABNORMAL HIGH (ref 70–99)
Potassium: 3.5 mmol/L (ref 3.5–5.1)
Sodium: 130 mmol/L — ABNORMAL LOW (ref 135–145)

## 2019-07-24 LAB — HIV-1 RNA QUANT-NO REFLEX-BLD
HIV 1 RNA Quant: 90 copies/mL
LOG10 HIV-1 RNA: 1.954 log10copy/mL

## 2019-07-24 LAB — TOXOPLASMA ANTIBODIES- IGG AND  IGM
Toxoplasma Antibody- IgM: <3 AU/mL (ref 0.0–7.9)
Toxoplasma IgG Ratio: >400 IU/mL — ABNORMAL HIGH (ref 0.0–7.1)

## 2019-07-24 LAB — GLUCOSE, CAPILLARY
Glucose-Capillary: 103 mg/dL — ABNORMAL HIGH (ref 70–99)
Glucose-Capillary: 94 mg/dL (ref 70–99)
Glucose-Capillary: 99 mg/dL (ref 70–99)
Glucose-Capillary: 108 mg/dL — ABNORMAL HIGH (ref 70–99)

## 2019-07-24 LAB — CBC
HCT: 26.6 % — ABNORMAL LOW (ref 39.0–52.0)
Hemoglobin: 9 g/dL — ABNORMAL LOW (ref 13.0–17.0)
MCH: 32.3 pg (ref 26.0–34.0)
MCHC: 33.8 g/dL (ref 30.0–36.0)
MCV: 95.3 fL (ref 80.0–100.0)
Platelets: 143 10*3/uL — ABNORMAL LOW (ref 150–400)
RBC: 2.79 MIL/uL — ABNORMAL LOW (ref 4.22–5.81)
RDW: 13.2 % (ref 11.5–15.5)
WBC: 4.8 10*3/uL (ref 4.0–10.5)
nRBC: 0 % (ref 0.0–0.2)

## 2019-07-24 LAB — GLUCOSE 6 PHOSPHATE DEHYDROGENASE
G6PDH: 10.6 U/g{Hb} (ref 4.8–15.7)
Hemoglobin: 10.3 g/dL — ABNORMAL LOW (ref 13.0–17.7)

## 2019-07-24 LAB — RPR
RPR Ser Ql: REACTIVE — AB
RPR Titer: 1:1 {titer}

## 2019-07-24 MED ORDER — ASPIRIN EC 81 MG PO TBEC: 81.0000 mg | DELAYED_RELEASE_TABLET | Freq: Every day | ORAL | Status: DC

## 2019-07-24 MED ORDER — DEXTROSE-NACL 5-0.9 % IV SOLN
INTRAVENOUS | Status: AC
  Administered 2019-07-24: 05:00:00 via INTRAVENOUS

## 2019-07-24 MED ORDER — ATOVAQUONE 750 MG/5ML PO SUSP
1500.0000 mg | Freq: Two times a day (BID) | ORAL | Status: DC
  Filled 2019-07-24: qty 10

## 2019-07-24 MED ORDER — IOHEXOL 300 MG/ML  SOLN
100.0000 mL | Freq: Once | INTRAMUSCULAR | Status: AC | PRN
  Administered 2019-07-24: 100 mL via INTRAVENOUS

## 2019-07-24 MED ORDER — ENSURE ENLIVE PO LIQD
237.0000 mL | Freq: Three times a day (TID) | ORAL | Status: DC
  Administered 2019-07-24: 237 mL via ORAL

## 2019-07-24 MED ORDER — ELVITEG-COBIC-EMTRICIT-TENOFAF 150-150-200-10 MG PO TABS: 1.0000 | ORAL_TABLET | Freq: Every day | ORAL | Status: DC

## 2019-07-24 MED ORDER — SULFADIAZINE 500 MG PO TABS
1000.0000 mg | ORAL_TABLET | Freq: Four times a day (QID) | ORAL | Status: DC
  Filled 2019-07-24: qty 2

## 2019-07-24 MED ORDER — ASPIRIN 300 MG RE SUPP: 300.0000 mg | Freq: Every day | RECTAL | Status: DC

## 2019-07-24 MED ORDER — MEGESTROL ACETATE 400 MG/10ML PO SUSP
400.0000 mg | Freq: Every day | ORAL | Status: DC
  Administered 2019-07-24: 400 mg via ORAL
  Filled 2019-07-24 (×2): qty 10

## 2019-07-24 MED ORDER — ATORVASTATIN CALCIUM 80 MG PO TABS
80.0000 mg | ORAL_TABLET | Freq: Every day | ORAL | Status: DC
  Administered 2019-07-24: 80 mg via ORAL
  Filled 2019-07-24: qty 1

## 2019-07-24 MED ORDER — INFLUENZA VAC A&B SA ADJ QUAD 0.5 ML IM PRSY
0.5000 mL | PREFILLED_SYRINGE | INTRAMUSCULAR | Status: DC
  Filled 2019-07-24: qty 0.5

## 2019-07-24 MED ORDER — VITAMIN B-1 100 MG PO TABS
100.0000 mg | ORAL_TABLET | Freq: Every day | ORAL | Status: DC
  Administered 2019-07-24: 100 mg via ORAL
  Filled 2019-07-24: qty 1

## 2019-07-24 MED ORDER — ALBUTEROL SULFATE (2.5 MG/3ML) 0.083% IN NEBU: 3.0000 mL | INHALATION_SOLUTION | Freq: Four times a day (QID) | RESPIRATORY_TRACT | Status: DC | PRN

## 2019-07-24 MED ORDER — MIRTAZAPINE 15 MG PO TABS
15.0000 mg | ORAL_TABLET | Freq: Every day | ORAL | Status: DC
  Administered 2019-07-24: 15 mg via ORAL
  Filled 2019-07-24: qty 1

## 2019-07-24 NOTE — Progress Notes (Signed)
Initial Nutrition Assessment  DOCUMENTATION CODES:   Not applicable  INTERVENTION:  Provide Ensure Enlive po TID, each supplement provides 350 kcal and 20 grams of protein.  Encourage adequate PO intake.   Recommend obtaining height and weight measurements to fully assess nutrition needs.   NUTRITION DIAGNOSIS:   Increased nutrient needs related to chronic illness(HIV) as evidenced by estimated needs.  GOAL:   Patient will meet greater than or equal to 90% of their needs  MONITOR:   PO intake, Supplement acceptance, Skin, Weight trends, Labs, I & O's  REASON FOR ASSESSMENT:   Consult Assessment of nutrition requirement/status  ASSESSMENT:   68 yo gentleman with history of HIV, history of recent NSTEMI, history of V fib arrest, poly substance use, admitted with altered mental status, has been transferred from [facility]. 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  Pt unavailable during time of attempted contact. RD unable to obtain pt nutrition history at this time. Per MD, pt with minimal to no PO intake. Pt is currently on a dysphagia 2 diet with thin liquids. RD to order nutritional supplements to aid in caloric and protein needs. Noted no recent weight or height measured. Recommend obtaining measurements to fully assess needs.   Unable to complete Nutrition-Focused physical exam at this time.   Labs and medications reviewed.   Diet Order:   Diet Order            DIET DYS 2 Room service appropriate? Yes; Fluid consistency: Thin  Diet effective now              EDUCATION NEEDS:   Not appropriate for education at this time  Skin:  Skin Assessment: Reviewed RN Assessment  Last BM:  10/29  Height:   Ht Readings from Last 1 Encounters:  No data found for Ht    Weight:   Wt Readings from Last 1 Encounters:  No data found for Wt    Ideal Body Weight:   N/A  BMI:  There is no height or weight on file to calculate  BMI.  Estimated Nutritional Needs:   Kcal:  2000-2200  Protein:  100-110 grams  Fluid:  >/= 2 L/day    Corrin Parker, MS, RD, LDN Pager # [phone] After hours/ weekend pager # [phone]

## 2019-07-24 NOTE — Progress Notes (Signed)
PROGRESS NOTE  Henry Oconnell ZOX:[id number] DOB: 13-Jun-1951 DOA: 07/22/2019 PCP: Patient, No Pcp Per  HPI/Recap of past 24 hours: Henry Oconnell a 68 y.o.malewithhistory of HIV, recently admitted at [facility] for non-ST elevation MI managed conservatively, history of polysubstance abuse, history of VF arrest was recently admitted at [facility] 3 days ago for acute encephalopathy. At the time work-up in the ER with CTA did not show anything acute. 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 Given this picture patient was planned to be transferred to [facility] for further infectious disease input and also may need more advanced care. As per the patient's brother who talked with physicians at [facility] patient has not been taking his antiretrovirals for more than 7 months current CD4 count is unknown. Patient was on Plavix aspirin and statin for his non-ST relation MI. Patient also has severe protein calorie malnutrition. Last labs done at [facility] showed hemoglobin of 11.2 platelets 134 creatinine 1 potassium 4.3. MRI brain showed multiple masslike areas with a predilection for gray-white matter junction and the ventricles. 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  07/23/19: Patient was seen and examined at his bedside this morning.  He is still altered and not following commands.  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  Infectious disease consulted to further assess.    Lab studies revealed some electrolyte abnormalities that are being repleted.  CD4 count pending.   Recent NSTEMI with hospitalization at [facility] that has been treated conservatively, history of VF arrest.  Will consult cardiology.  07/24/19: Patient was seen and examined at his  bedside this morning.  He is somnolent but easily arousable.  Not in distress.  CT head, chest and abdomen/pelvis completed today.  Seen by infectious disease, following.  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 No new areas of hemorrhage. Seen by infectious diseaseSeen by neurosurgery no plan for procedures at this time EEG completed on 07/23/2019 no seizure discharges  CNS toxoplasmosis and HIV Defer toxoplasmosis management to infectious disease Currently on dapsone, continue CD4 count 189 Defer HIV management per infectious disease  Severe protein calorie malnutrition  BMI low, not calculated.  Weight 54 kg.   Dietitian consult   History of recent non-STEMI admitted at [facility] was conservatively managed on aspirin Plavix and statins. Resume p.o. aspirin and statin  Resume Plavix if no planned procedures  History of VF arrest. Continue to monitor on telemetry  Hypertension  Blood pressure stable We will keep patient on as needed IV hydralazine.  Goals of care Palliative care team following Continue full scope of treatment Full code     DVT prophylaxis: SCDs Code Status: Full code. Family Communication: Unable to reach family. Disposition Plan:  Ongoing work-up for persistent encephalopathy. Consults called:  Infectious disease, neurosurgery, palliative care team.   Objective: Vitals:   07/23/19 1953 07/24/19 0014 07/24/19  0446  07/24/19 0808  BP: (!) 105/57 140/84 (!) 144/76 (!) 143/78  Pulse: 78 78 73 74  Resp: 18 18 18 13   Temp: 97.6 F (36.4 C) 98 F (36.7 C) (!) 97.4 F (36.3 C) 97.6 F (36.4 C)  TempSrc: Oral Oral Oral Oral  SpO2: 98% 100% 94% 98%    Intake/Output Summary (Last 24 hours) at 07/24/2019 1139 Last data filed at 07/23/2019 1825 Gross per 24 hour  Intake 906.22 ml  Output 825 ml  Net 81.22 ml   There were no vitals filed for this visit.  Exam:   General: 68 y.o. year-old male frail-appearing no acute distress.  Somnolent but arousable to voices.  Cardiovascular: Regular rate and rhythm with no rubs or gallops.  No thyromegaly or JVD noted.    Respiratory: Clear to auscultation with no wheezes or rales. Good inspiratory effort.  Abdomen: Soft nontender nondistended with normal bowel sounds x4 quadrants.  Musculoskeletal: No lower extremity edema. 2/4 pulses in all 4 extremities.  Psychiatry: Unable to assess mood due to somnolence.   Data Reviewed: CBC: Recent Labs  Lab 07/23/19 0101 07/24/19 0314  WBC 4.5 4.8  NEUTROABS 2.0  --   HGB 9.5* 9.0*  HCT 28.0* 26.6*  MCV 95.9 95.3  PLT 129* 143*   Basic Metabolic Panel: Recent Labs  Lab 07/23/19 0101 07/23/19 0337 07/24/19 0314  NA 133*  --  130*  K 3.4*  --  3.5  CL 103  --  102  CO2 23  --  23  GLUCOSE 117*  --  116*  BUN 6*  --  5*  CREATININE 0.73  --  0.71  CALCIUM 8.3*  --  7.9*  MG  --  1.4*  --    GFR: CrCl cannot be calculated (Unknown ideal weight.). Liver Function Tests: Recent Labs  Lab 07/23/19 0101  AST 33  ALT 25  ALKPHOS 55  BILITOT 0.6  PROT 8.4*  ALBUMIN 2.5*   No results for input(s): LIPASE, AMYLASE in the last 168 hours. No results for input(s): AMMONIA in the last 168 hours. Coagulation Profile: No results for input(s): INR, PROTIME in the last 168 hours. Cardiac Enzymes: No results for input(s): CKTOTAL, CKMB, CKMBINDEX, TROPONINI in the last 168 hours. BNP (last 3  results) No results for input(s): PROBNP in the last 8760 hours. HbA1C: No results for input(s): HGBA1C in the last 72 hours. CBG: Recent Labs  Lab 07/23/19 1140 07/23/19 1633 07/23/19 1945 07/23/19 2109 07/24/19 0625  GLUCAP 91 89 115* 93 103*   Lipid Profile: No results for input(s): CHOL, HDL, LDLCALC, TRIG, CHOLHDL, LDLDIRECT in the last 72 hours. Thyroid Function Tests: No results for input(s): TSH, T4TOTAL, FREET4, T3FREE, THYROIDAB in the last 72 hours. Anemia Panel: No results for input(s): VITAMINB12, FOLATE, FERRITIN, TIBC, IRON, RETICCTPCT in the last 72 hours. Urine analysis:    Component Value Date/Time   COLORURINE STRAW (A) 09/03/2016 2220   APPEARANCEUR CLEAR (A) 09/03/2016 2220   LABSPEC 1.002 (L) 09/03/2016 2220   PHURINE 8.0 09/03/2016 2220   GLUCOSEU NEGATIVE 09/03/2016 2220   HGBUR NEGATIVE 09/03/2016 2220   BILIRUBINUR NEGATIVE 09/03/2016 2220   KETONESUR NEGATIVE 09/03/2016 2220   PROTEINUR NEGATIVE 09/03/2016 2220   NITRITE NEGATIVE 09/03/2016 2220   LEUKOCYTESUR NEGATIVE 09/03/2016 2220   Sepsis Labs: @LABRCNTIP (procalcitonin:4,lacticidven:4)  ) Recent Results (from the past 240 hour(s))  SARS CORONAVIRUS 2 (TAT 6-24 HRS) Nasopharyngeal Nasopharyngeal Swab     Status: None   Collection  Time: 07/23/19  5:35 AM   Specimen: Nasopharyngeal Swab  Result Value Ref Range Status   SARS Coronavirus 2 NEGATIVE NEGATIVE Final    Comment: (NOTE) SARS-CoV-2 target nucleic acids are NOT DETECTED. The SARS-CoV-2 RNA is generally detectable in upper and lower respiratory specimens during the acute phase of infection. Negative results do not preclude SARS-CoV-2 infection, do not rule out co-infections with other pathogens, and should not be used as the sole basis for treatment or other patient management decisions. Negative results must be combined with clinical observations, patient history, and epidemiological information. The expected result is  Negative. Fact Sheet for Patients: SugarRoll.be Fact Sheet for Healthcare Providers: https://www.woods-mathews.com/ This test is not yet approved or cleared by the [place] FDA and  has been authorized for detection and/or diagnosis of SARS-CoV-2 by FDA under an Emergency Use Authorization (EUA). This EUA will remain  in effect (meaning this test can be used) for the duration of the COVID-19 declaration under Section 56 4(b)(1) of the Act, 21 U.S.C. section 360bbb-3(b)(1), unless the authorization is terminated or revoked sooner. Performed at [facility], [place] [address], [place], [place] [address]       Studies: Ct Head Wo Contrast  Result Date: 07/24/2019 CLINICAL DATA:  Encephalopathy. Concern for CNS lymphoma based on MRI. Follow-up hemorrhage. EXAM: CT HEAD WITHOUT CONTRAST TECHNIQUE: Contiguous axial images were obtained from the base of the skull through the vertex without intravenous contrast. COMPARISON:  07/22/2019 and 07/21/2019 as well as MRI brain 07/20/2019 FINDINGS: Brain: Ventricles and cisterns are within normal. Stable small left frontal hygroma. Persistent small focus of acute subarachnoid hemorrhage over the left frontoparietal convexity. Resolving tiny focus of acute hemorrhage over the region of the septum pellucidum. No new areas of hemorrhage. No significant mass, mass effect or shift of midline structures. Right basal ganglia calcification. No acute infarction. Vascular: No hyperdense vessel or unexpected calcification. Skull: Normal. Negative for fracture or focal lesion. Sinuses/Orbits: No acute finding. Other: None. IMPRESSION: 1. Stable small focus of acute subarachnoid hemorrhage over the left frontoparietal region. Resolving tiny focus of hemorrhage over the septum post and. No new areas of hemorrhage. 2.  Stable small left frontal hygroma. Electronically Signed   By: Marin Olp M.D.   On: 07/24/2019 07:40    Ct Chest W Contrast  Result Date: 07/24/2019 CLINICAL DATA:  Looking for lymphadenopathy.  HIV with brain masses. EXAM: CT CHEST, ABDOMEN, AND PELVIS WITH CONTRAST TECHNIQUE: Multidetector CT imaging of the chest, abdomen and pelvis was performed following the standard protocol during bolus administration of intravenous contrast. CONTRAST:  129mL OMNIPAQUE IOHEXOL 300 MG/ML  SOLN COMPARISON:  Abdomen and pelvis CT 08/28/2016 FINDINGS: CT CHEST FINDINGS Cardiovascular: Normal heart size. No pericardial effusion. Multifocal coronary calcification. Aortic atherosclerosis. Mediastinum/Nodes: Negative for adenopathy. Lungs/Pleura: Mild paraseptal emphysema. Mild patchy reticular opacity likely reflecting atelectasis. No consolidation, edema, effusion, or pneumothorax. Musculoskeletal: No acute finding. Posterior subluxation of the right sternoclavicular joint. CT ABDOMEN PELVIS FINDINGS Hepatobiliary: Hypervascular central hepatic lesion measuring 14 mm, stable and consistent with flash fill hemangioma given the long interval and blood pool on delayed phase. A similarly enhancing nodule is seen in the lateral segment left lobe at 12 mm, also stable. Wedge-shaped areas of geographic perfusion anomaly in the sub capsular right liver without evident underlying mass, attributed to transient perfusion anomalies. No detected cirrhotic changes in this patient with history of hepatitis C.No evidence of biliary obstruction or stone. Pancreas: Unremarkable. Spleen: Unremarkable. Adrenals/Urinary Tract: Negative  adrenals. No hydronephrosis or ureteral stone. Bilateral hilar calcifications appear atherosclerotic. Unremarkable bladder. Stomach/Bowel:  No obstruction. No evidence of inflammation. 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 No mass or adenopathy. Reproductive:Negative Other: No ascites or pneumoperitoneum. Musculoskeletal: No  acute abnormalities. IMPRESSION: 1. No evidence of primary malignancy or metastatic disease. No evidence of opportunistic infection. 2. 2 hypervascular liver lesions that are stable from 2017 and consistent with hemangioma. 3. Aortic aneurysm NOS (ICD10-I71.9). If appropriate for comorbidities, recommend followup by ultrasound in 3 years. This recommendation follows ACR consensus guidelines: White Paper of the ACR Incidental Findings Committee II on Vascular Findings. J Am Coll Radiol 2013; 41:740-814 4.  Aortic Atherosclerosis (ICD10-I70.0). Electronically Signed   By: Marnee Spring M.D.   On: 07/24/2019 07:42   Ct Abdomen Pelvis W Contrast  Result Date: 07/24/2019 CLINICAL DATA:  Looking for lymphadenopathy.  HIV with brain masses. EXAM: CT CHEST, ABDOMEN, AND PELVIS WITH CONTRAST TECHNIQUE: Multidetector CT imaging of the chest, abdomen and pelvis was performed following the standard protocol during bolus administration of intravenous contrast. CONTRAST:  OMNIPAQUE IOHEXOL 300 MG/ML  SOLN COMPARISON:  Abdomen and pelvis CT 08/28/2016 FINDINGS: CT CHEST FINDINGS Cardiovascular: Normal heart size. No pericardial effusion. Multifocal coronary calcification. Aortic atherosclerosis. Mediastinum/Nodes: Negative for adenopathy. Lungs/Pleura: Mild paraseptal emphysema. Mild patchy reticular opacity likely reflecting atelectasis. No consolidation, edema, effusion, or pneumothorax. Musculoskeletal: No acute finding. Posterior subluxation of the right sternoclavicular joint. CT ABDOMEN PELVIS FINDINGS Hepatobiliary: Hypervascular central hepatic lesion measuring 14 mm, stable and consistent with flash fill hemangioma given the long interval and blood pool on delayed phase. A similarly enhancing nodule is seen in the lateral segment left lobe at 12 mm, also stable. Wedge-shaped areas of geographic perfusion anomaly in the sub capsular right liver without evident underlying mass, attributed to transient perfusion  anomalies. No detected cirrhotic changes in this patient with history of hepatitis C.No evidence of biliary obstruction or stone. Pancreas: Unremarkable. Spleen: Unremarkable. Adrenals/Urinary Tract: Negative adrenals. No hydronephrosis or ureteral stone. Bilateral hilar calcifications appear atherosclerotic. Unremarkable bladder. Stomach/Bowel:  No obstruction. No evidence of inflammation. 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 No mass or adenopathy. Reproductive:Negative Other: No ascites or pneumoperitoneum. Musculoskeletal: No acute abnormalities. IMPRESSION: 1. No evidence of primary malignancy or metastatic disease. No evidence of opportunistic infection. 2. 2 hypervascular liver lesions that are stable from 2017 and consistent with hemangioma. 3. Aortic aneurysm NOS (ICD10-I71.9). If appropriate for comorbidities, recommend followup by ultrasound in 3 years. This recommendation follows ACR consensus guidelines: White Paper of the ACR Incidental Findings Committee II on Vascular Findings. J Am Coll Radiol 2013; 48:185-631 4.  Aortic Atherosclerosis (ICD10-I70.0). Electronically Signed   By: Marnee Spring M.D.   On: 07/24/2019 07:42    Scheduled Meds:  atorvastatin  80 mg Oral Daily   dapsone  100 mg Oral Daily   megestrol  400 mg Oral Daily   mirtazapine  15 mg Oral QHS   thiamine  100 mg Oral Daily    Continuous Infusions:  dextrose 5 % and 0.9% NaCl       LOS: 2 days     Darlin Drop, MD [facility] Pager [phone]  If 7PM-7AM, please contact night-coverage [email or web address] Password [email or web address] 07/24/2019, 11:39 AM

## 2019-07-24 NOTE — Progress Notes (Signed)
  Speech Language Pathology Treatment: Dysphagia  Patient Details Name: Henry Oconnell MRN: [id number] DOB: Oct 11, 1950 Today's Date: 07/24/2019 Time: 4503-8882 SLP Time Calculation (min) (ACUTE ONLY): 13 min  Assessment / Plan / Recommendation Clinical Impression  Pt was encountered awake/alert and confused, lying reclined in bed.  RN reported that pt had exhibited oral holding/buccal pocketing with pulled meat yesterday; however, she stated that he had otherwise been tolerating his diet without difficulty.  Pt was seen with trials of thin liquid via straw sip, puree, and soft solids.  He exhibited prolonged mastication of soft solids with mild oral residue that required multiple cued liquid washes to clear.  He presented with a delayed cough following 1/2 soft solid trials.  Pt additionally exhibited prolonged AP transport of pureed solids, but no oral residue was observed.  No clinical s/sx of aspiration were observed with pureed solids or thin liquid.  Recommend continuation of Dysphagia 2 (fine chop) solids and thin liquid with assistance for po intake and full supervision to cue for compensatory strategies.     HPI HPI: Henry Oconnell, 68 y/m, transfered from [facility] for infectious disease input and possible advanced care. PMH: h/o HIV,  non-ST elevation MI, poly substance abuse, VF arrest and recent admission to [facility]. for acute encephalopathy. 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 No previous ST services.       SLP Plan  Continue with current plan of care       Recommendations  Diet recommendations: Dysphagia 2 (fine chop);Thin liquid Liquids provided via: Cup;Straw Medication Administration: Whole meds with puree Supervision: Staff to assist with self feeding;Full supervision/cueing for compensatory strategies Compensations: Minimize environmental distractions;Slow rate;Small sips/bites Postural Changes  and/or Swallow Maneuvers: Seated upright 90 degrees                Oral Care Recommendations: Staff/trained caregiver to provide oral care;Oral care BID Follow up Recommendations: Skilled Nursing facility;24 hour supervision/assistance SLP Visit Diagnosis: Dysphagia, oral phase (R13.11) Plan: Continue with current plan of care       Colin Mulders M.S., [place] Office: [phone]      [place] 07/24/2019, 1:06 PM

## 2019-07-24 NOTE — Care Management Important Message (Signed)
Important Message  Patient Details  Name: Henry Oconnell MRN: [id number] Date of Birth: 1951-05-23   Medicare Important Message Given:  Yes     Aamna Mallozzi 07/24/2019, 1:35 PM

## 2019-07-24 NOTE — Progress Notes (Signed)
PMT progress note  Patient seen earlier today, opens eyes when name is called, verbalizes "yes" when name is called. Doesn't interact or verbalize beyond that. SLP evaluation done, recommended for soft diet, patient with minimal to nil PO intake so far.  BP (!) 154/82 (BP Location: Left Arm)   Pulse 65   Temp 98 F (36.7 C) (Oral)   Resp 17   SpO2 100%  Labs and imaging noted Chart reviewed PPS 30% Awake not alert No distress Has foley Regular S1 S2 Appears thin and weak No edema  68 yo gentleman with history of HIV, history of recent NSTEMI, history of V fib arrest, poly substance use, admitted with altered mental status, has been transferred from [facility]. 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 Continue current mode of care, continue efforts to reach next of kin for overall goals of care discussions.  Greater than 50%  of this time was spent counseling and coordinating care related to the above assessment and plan. 25 minutes spent Loistine Chance MD [phone]

## 2019-07-24 NOTE — Progress Notes (Signed)
[facility]  Date of Admission:  07/22/2019      Total days of antibiotics 0           ASSESSMENT: Henry Oconnell is a 68 y.o. male with uncontrolled HIV, +AIDS (VL pending, CD4 189) here with encephalopathy in the setting of multiple brain abscesses. Peripheral Toxo IgG positive. CT A/P/C without any other signs of adenopathy or metastatic process so unable to biopsy for consideration of lymphoma easily. Appreciate neurosurgery's evaluation - will begin treatment for toxoplasma CNS infection and start with Atovoquone and trial sulfadiazine once enteric tube is placed (need consistent medication access for treatment and likely tube feeds to support nutritionally). Will need to arrange ordering in of pyrimethamine and anticipate starting this + leucovorin next week.   With sulfa rash will need to watch for reaction with sulfadiazine cross-reactivity.   He will need repeat head imaging after starting treatment to follow for changes and recession of disease process. Hold antiretrovirals for now until we get a good start on toxo treatment as there is a concern for refractory inflammatory response in the brain that could be lethal. Will look to literature to determine proper timing of re-introduction. Continue dapsone for PJP prophylaxis for now.     PLAN: 1. Please place Cortrak for enteric access for meds 2. Start Atovoquone 1500 mg BID VT Q6h once #1 in place 3. Start Sulfadiazine 1000 mg Q6h VT once #1 in place 4. Once available will start Pyrimethamine 200 mg x1 then 50 mg QD + leucovorin 25 mg QVT 5. Hold Biktarvy  6. Continue Dapsone 7. Follow G6PD 8. Follow for signs of rash or intolerance to Sulfadiazine 9. Follow Hep C RNA    Principal Problem:   Toxoplasma meningoencephalitis (HCC) Active Problems:   Human immunodeficiency virus (HIV) disease (HCC)   Hepatitis C   Acute encephalopathy   Brain mass   Coronary artery disease involving  native coronary artery of native heart without angina pectoris   Palliative care by specialist   Goals of care, counseling/discussion   . atorvastatin  80 mg Oral Daily  . dapsone  100 mg Oral Daily  . [START ON 07/25/2019] influenza vaccine adjuvanted  0.5 mL Intramuscular Tomorrow-1000  . megestrol  400 mg Oral Daily  . mirtazapine  15 mg Oral QHS  . thiamine  100 mg Oral Daily    SUBJECTIVE: Non-verbal.   Interval Additions -  C/A/P CT scan unrevealing for any concern over adenopathy/metastatic disease.  Toxo IgG (+)    Review of Systems: Review of Systems  Unable to perform ROS: Mental status change    Allergies  Allergen Reactions  . Sulfamethoxazole Micro [Sulfamethoxazole] Rash  . Trimethoprim Rash    OBJECTIVE: Vitals:   07/24/19 0446 07/24/19 0808 07/24/19 1230 07/24/19 1500  BP: (!) 144/76 (!) 143/78 (!) 154/82 127/85  Pulse: 73 74 65 72  Resp: 18 13 17 18   Temp: (!) 97.4 F (36.3 C) 97.6 F (36.4 C) 98 F (36.7 C) 98 F (36.7 C)  TempSrc: Oral Oral Oral Oral  SpO2: 94% 98% 100% 100%   There is no height or weight on file to calculate BMI.  Physical Exam Vitals signs and nursing note reviewed.  Constitutional:      Appearance: He is ill-appearing (chronically so).     Comments: Opens eyes very briefly to name call; no further response  HENT:     Mouth/Throat:  Mouth: Mucous membranes are dry.     Pharynx: No oropharyngeal exudate.  Eyes:     General: No scleral icterus.    Pupils: Pupils are equal, round, and reactive to light.  Cardiovascular:     Rate and Rhythm: Normal rate and regular rhythm.     Heart sounds: No murmur.  Pulmonary:     Effort: Pulmonary effort is normal.     Breath sounds: Normal breath sounds.  Neurological:     Comments: Makes brief eye contact then falls asleep. Does not follow commands.      Lab Results Lab Results  Component Value Date   WBC 4.8 07/24/2019   HGB 9.0 (L) 07/24/2019   HCT 26.6 (L)  07/24/2019   MCV 95.3 07/24/2019   PLT 143 (L) 07/24/2019    Lab Results  Component Value Date   CREATININE 0.71 07/24/2019   BUN 5 (L) 07/24/2019   NA 130 (L) 07/24/2019   K 3.5 07/24/2019   CL 102 07/24/2019   CO2 23 07/24/2019    Lab Results  Component Value Date   ALT 25 07/23/2019   AST 33 07/23/2019   ALKPHOS 55 07/23/2019   BILITOT 0.6 07/23/2019     Microbiology: Recent Results (from the past 240 hour(s))  SARS CORONAVIRUS 2 (TAT 6-24 HRS) Nasopharyngeal Nasopharyngeal Swab     Status: None   Collection Time: 07/23/19  5:35 AM   Specimen: Nasopharyngeal Swab  Result Value Ref Range Status   SARS Coronavirus 2 NEGATIVE NEGATIVE Final    Comment: (NOTE) SARS-CoV-2 target nucleic acids are NOT DETECTED. The SARS-CoV-2 RNA is generally detectable in upper and lower respiratory specimens during the acute phase of infection. Negative results do not preclude SARS-CoV-2 infection, do not rule out co-infections with other pathogens, and should not be used as the sole basis for treatment or other patient management decisions. Negative results must be combined with clinical observations, patient history, and epidemiological information. The expected result is Negative. Fact Sheet for Patients: SugarRoll.be Fact Sheet for Healthcare Providers: https://www.woods-mathews.com/ This test is not yet approved or cleared by the [place] FDA and  has been authorized for detection and/or diagnosis of SARS-CoV-2 by FDA under an Emergency Use Authorization (EUA). This EUA will remain  in effect (meaning this test can be used) for the duration of the COVID-19 declaration under Section 56 4(b)(1) of the Act, 21 U.S.C. section 360bbb-3(b)(1), unless the authorization is terminated or revoked sooner. Performed at [facility], [place] [address] [place],  [address]     Janene Madeira, MSN, NP-C [facility] [email or web address] Pager: [phone] Office: [phone] [place]: [phone]

## 2019-07-25 ENCOUNTER — Inpatient Hospital Stay (HOSPITAL_COMMUNITY): Payer: Medicare Other

## 2019-07-25 LAB — CBC
HCT: 29.9 % — ABNORMAL LOW (ref 39.0–52.0)
Hemoglobin: 10 g/dL — ABNORMAL LOW (ref 13.0–17.0)
MCH: 32.3 pg (ref 26.0–34.0)
MCHC: 33.4 g/dL (ref 30.0–36.0)
MCV: 96.5 fL (ref 80.0–100.0)
Platelets: 157 10*3/uL (ref 150–400)
RBC: 3.1 MIL/uL — ABNORMAL LOW (ref 4.22–5.81)
RDW: 13.5 % (ref 11.5–15.5)
WBC: 3.3 10*3/uL — ABNORMAL LOW (ref 4.0–10.5)
nRBC: 0 % (ref 0.0–0.2)

## 2019-07-25 LAB — BASIC METABOLIC PANEL
Anion gap: 7 (ref 5–15)
BUN: 5 mg/dL — ABNORMAL LOW (ref 8–23)
CO2: 25 mmol/L (ref 22–32)
Calcium: 8.7 mg/dL — ABNORMAL LOW (ref 8.9–10.3)
Chloride: 102 mmol/L (ref 98–111)
Creatinine, Ser: 0.73 mg/dL (ref 0.61–1.24)
GFR calc Af Amer: >60 mL/min (ref >60)
GFR calc non Af Amer: >60 mL/min (ref >60)
Glucose, Bld: 90 mg/dL (ref 70–99)
Potassium: 3.2 mmol/L — ABNORMAL LOW (ref 3.5–5.1)
Sodium: 134 mmol/L — ABNORMAL LOW (ref 135–145)

## 2019-07-25 LAB — HCV RNA QUANT
HCV Quantitative Log: 6.004 log10 IU/mL (ref >1.70)
HCV Quantitative: 1010000 IU/mL (ref >50)

## 2019-07-25 LAB — GLUCOSE, CAPILLARY
Glucose-Capillary: 86 mg/dL (ref 70–99)
Glucose-Capillary: 108 mg/dL — ABNORMAL HIGH (ref 70–99)
Glucose-Capillary: 99 mg/dL (ref 70–99)
Glucose-Capillary: 129 mg/dL — ABNORMAL HIGH (ref 70–99)

## 2019-07-25 MED ORDER — QUETIAPINE FUMARATE 25 MG PO TABS
12.5000 mg | ORAL_TABLET | Freq: Two times a day (BID) | ORAL | Status: DC
  Administered 2019-07-25 – 2019-07-26 (×3): 12.5 mg
  Filled 2019-07-25 (×3): qty 1

## 2019-07-25 MED ORDER — DEXTROSE-NACL 5-0.9 % IV SOLN: INTRAVENOUS | Status: DC

## 2019-07-25 MED ORDER — ENSURE ENLIVE PO LIQD
237.0000 mL | Freq: Three times a day (TID) | ORAL | Status: DC
  Administered 2019-07-25 – 2019-07-28 (×7): 237 mL

## 2019-07-25 MED ORDER — DEXTROSE-NACL 5-0.9 % IV SOLN
INTRAVENOUS | Status: DC
  Administered 2019-07-25 – 2019-07-26 (×4): via INTRAVENOUS

## 2019-07-25 MED ORDER — ONDANSETRON HCL 4 MG/2ML IJ SOLN: 4.0000 mg | Freq: Four times a day (QID) | INTRAMUSCULAR | Status: DC | PRN

## 2019-07-25 MED ORDER — QUETIAPINE FUMARATE 25 MG PO TABS: 25.0000 mg | ORAL_TABLET | Freq: Two times a day (BID) | ORAL | Status: DC

## 2019-07-25 MED ORDER — ACETAMINOPHEN 325 MG PO TABS
650.0000 mg | ORAL_TABLET | Freq: Four times a day (QID) | ORAL | Status: DC | PRN
  Administered 2019-07-26 – 2019-07-27 (×2): 650 mg
  Filled 2019-07-25 (×2): qty 2

## 2019-07-25 MED ORDER — MIRTAZAPINE 15 MG PO TABS
15.0000 mg | ORAL_TABLET | Freq: Every day | ORAL | Status: DC
  Administered 2019-07-25 – 2019-07-28 (×3): 15 mg
  Filled 2019-07-25 (×3): qty 1

## 2019-07-25 MED ORDER — CLOPIDOGREL BISULFATE 75 MG PO TABS
75.0000 mg | ORAL_TABLET | Freq: Every day | ORAL | Status: DC
  Administered 2019-07-25 – 2019-08-01 (×8): 75 mg
  Filled 2019-07-25 (×9): qty 1

## 2019-07-25 MED ORDER — CLOPIDOGREL BISULFATE 75 MG PO TABS: 75.0000 mg | ORAL_TABLET | Freq: Every day | ORAL | Status: DC

## 2019-07-25 MED ORDER — SULFADIAZINE 500 MG PO TABS
1000.0000 mg | ORAL_TABLET | Freq: Four times a day (QID) | ORAL | Status: DC
  Filled 2019-07-25 (×6): qty 2

## 2019-07-25 MED ORDER — ENOXAPARIN SODIUM 40 MG/0.4ML ~~LOC~~ SOLN
40.0000 mg | SUBCUTANEOUS | Status: DC
  Administered 2019-07-25 – 2019-08-07 (×14): 40 mg via SUBCUTANEOUS
  Filled 2019-07-25 (×14): qty 0.4

## 2019-07-25 MED ORDER — ACETAMINOPHEN 650 MG RE SUPP: 650.0000 mg | Freq: Four times a day (QID) | RECTAL | Status: DC | PRN

## 2019-07-25 MED ORDER — ONDANSETRON HCL 4 MG PO TABS: 4.0000 mg | ORAL_TABLET | Freq: Four times a day (QID) | ORAL | Status: DC | PRN

## 2019-07-25 MED ORDER — VITAMIN B-1 100 MG PO TABS
100.0000 mg | ORAL_TABLET | Freq: Every day | ORAL | Status: DC
  Administered 2019-07-26 – 2019-08-01 (×7): 100 mg
  Filled 2019-07-25 (×8): qty 1

## 2019-07-25 MED ORDER — LORAZEPAM 2 MG/ML IJ SOLN
1.0000 mg | Freq: Once | INTRAMUSCULAR | Status: AC
  Administered 2019-07-25: 1 mg via INTRAVENOUS
  Filled 2019-07-25: qty 1

## 2019-07-25 MED ORDER — ATOVAQUONE 750 MG/5ML PO SUSP
1500.0000 mg | Freq: Two times a day (BID) | ORAL | Status: DC
  Administered 2019-07-25 – 2019-07-28 (×6): 1500 mg
  Filled 2019-07-25 (×7): qty 10

## 2019-07-25 MED ORDER — OSMOLITE 1.2 CAL PO LIQD
1000.0000 mL | ORAL | Status: DC
  Administered 2019-07-25 – 2019-07-27 (×5): 1000 mL
  Filled 2019-07-25 (×9): qty 1000

## 2019-07-25 MED ORDER — MEGESTROL ACETATE 400 MG/10ML PO SUSP
400.0000 mg | Freq: Every day | ORAL | Status: DC
  Administered 2019-07-26 – 2019-07-29 (×4): 400 mg
  Filled 2019-07-25 (×4): qty 10

## 2019-07-25 MED ORDER — POTASSIUM CHLORIDE 20 MEQ/15ML (10%) PO SOLN
40.0000 meq | Freq: Two times a day (BID) | ORAL | Status: AC
  Administered 2019-07-25 (×2): 40 meq
  Filled 2019-07-25 (×2): qty 30

## 2019-07-25 MED ORDER — ATORVASTATIN CALCIUM 80 MG PO TABS
80.0000 mg | ORAL_TABLET | Freq: Every day | ORAL | Status: DC
  Administered 2019-07-26 – 2019-07-29 (×4): 80 mg
  Filled 2019-07-25 (×4): qty 1

## 2019-07-25 NOTE — Progress Notes (Signed)
PROGRESS NOTE  Henry Oconnell YDX:[id number] DOB: [birth year] DOA: 07/22/2019 PCP: Patient, No Pcp Per  HPI/Recap of past 24 hours: TAHMID Oconnell a 68 y.o.malewithhistory of HIV, recently admitted at [facility] for non-ST elevation MI managed conservatively, history of polysubstance abuse, history of VF arrest was recently admitted at [facility] 3 days ago for acute encephalopathy. At the time work-up in the ER with CTA did not show anything acute. 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 Given this picture patient was planned to be transferred to [facility] for further infectious disease input and also may need more advanced care. As per the patient's brother who talked with physicians at [facility] patient has not been taking his antiretrovirals for more than 7 months current CD4 count is unknown. Patient was on Plavix aspirin and statin for his non-ST relation MI. Patient also has severe protein calorie malnutrition. Last labs done at [facility] showed hemoglobin of 11.2 platelets 134 creatinine 1 potassium 4.3. MRI brain showed multiple masslike areas with a predilection for gray-white matter junction and the ventricles. 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 started on treatment by infectious disease.    Hospital course complicated by poor oral intake and delirium in the setting of CNS toxoplasmosis and HIV.  NG tube placed at bedside on 07/25/2019 for medications and nutrition.  Dietitian consulted for tube feeding.  07/25/19: Patient was seen and examined at his bedside this morning.  He is somnolent but arouses to voices.  Does not participate in exam.  Denies any pain.  Not in acute distress.  Assessment/Plan: Principal Problem:   Toxoplasma meningoencephalitis (HCC) Active Problems:   Human immunodeficiency virus (HIV)  disease (HCC)   Hepatitis C   Acute encephalopathy   Brain mass   Coronary artery disease involving native coronary artery of native heart without angina pectoris   Palliative care by specialist   Goals of care, counseling/discussion  Acute metabolic encephalopathy likely secondary to CNS toxoplasmosis with HIV.   CT head done on 07/24/2019 showed stable small focus of acute subarachnoid hemorrhage over the left frontoparietal region.Resolving tiny focus of hemorrhage over the septum post and. No new areas of hemorrhage. Seen by infectious disease, following Seen by neurosurgery no plan for procedures at this time EEG completed on 07/23/2019 no seizure discharges Positive toxoplasmosis antibodies IgG  CNS toxoplasmosis and HIV Defer toxoplasmosis management to infectious disease Started on treatment on 07/24/2019 Continue atovaquone and sulfadiazine as recommended by infectious disease via NG tube Continue to hold off HIV medication while on treatment for toxoplasmosis Continue dapsone and followed G6PD  RPR reactive Defer management to infectious disease  Dysphagia with poor oral intake status post NG tube placement on 07/25/2019 Evaluated by speech therapy on 07/24/2019 with recommendation for dysphagia 2 fine chop thin liquids Per bedside RN patient refused to take his medications NG tube placed at bedside on 07/25/2019 Aspiration precautions with head of bed elevated greater than 30 degree angle during feedings Dietitian consulted for feeding tube management  Severe protein calorie malnutrition  BMI low, not calculated.  Weight 54 kg.   Dietitian consult   History of recent non-STEMI admitted at [facility] was conservatively managed on aspirin Plavix and statins. Continue p.o. aspirin and statin  Continue Plavix.  Since no planned procedure.  History of VF arrest. Continue to monitor on telemetry  Hypertension  Blood pressure stable We  will keep patient on as needed  IV hydralazine.  Goals of care Palliative care team following Continue full scope of treatment Full code  Physical debility/ PT OT to assess For precautions     DVT prophylaxis:  Subcu Lovenox daily Code Status: Full code. Family Communication: Unable to reach family.  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  Will discharge when infectious disease signs off.  Consults called:  Infectious disease, neurosurgery, palliative care team.   Objective: Vitals:   07/24/19 2003 07/24/19 2323 07/25/19 0428 07/25/19 0844  BP:  119/63 (!) 161/81 (!) 171/81  Pulse:  61 (!) 59 73  Resp:  17 17 20   Temp: 100.2 F (37.9 C) 98.7 F (37.1 C) 98.4 F (36.9 C) 97.6 F (36.4 C)  TempSrc: Oral Oral Oral Oral  SpO2:  95% 100% 100%    Intake/Output Summary (Last 24 hours) at 07/25/2019 1011 Last data filed at 07/25/2019 0800 Gross per 24 hour  Intake 1305 ml  Output 2250 ml  Net -945 ml   There were no vitals filed for this visit.  Exam:  . General: 68 y.o. year-old male well-appearing in no acute distress.  Somnolent but arousable to voices.   . Cardiovascular: Regular rate and rhythm no rubs or gallops no JVD or thyromegaly noted.   [name] Respiratory: Clear to auscultation no wheezes or rales.  Poor inspiratory effort.   . Abdomen: Soft nontender nondistended normal bowel sounds present.   . Musculoskeletal: No lower extremity edema.  Total 4 pulses in all 4 extremities.   [name] Psychiatry: Unable to assess mood due to somnolence.   Data Reviewed: CBC: Recent Labs  Lab 07/23/19 0101 07/23/19 1620 07/24/19 0314 07/25/19 0651  WBC 4.5  --  4.8 3.3*  NEUTROABS 2.0  --   --   --   HGB 9.5* 10.3* 9.0* 10.0*  HCT 28.0*  --  26.6* 29.9*  MCV 95.9  --  95.3 96.5  PLT 129*  --  143* 992   Basic Metabolic Panel: Recent Labs  Lab 07/23/19 0101 07/23/19 0337 07/24/19 0314 07/25/19 0651  NA 133*  --  130* 134*  K 3.4*  --  3.5 3.2*  CL 103   --  102 102  CO2 23  --  23 25  GLUCOSE 117*  --  116* 90  BUN 6*  --  5* 5*  CREATININE 0.73  --  0.71 0.73  CALCIUM 8.3*  --  7.9* 8.7*  MG  --  1.4*  --   --    GFR: CrCl cannot be calculated (Unknown ideal weight.). Liver Function Tests: Recent Labs  Lab 07/23/19 0101  AST 33  ALT 25  ALKPHOS 55  BILITOT 0.6  PROT 8.4*  ALBUMIN 2.5*   No results for input(s): LIPASE, AMYLASE in the last 168 hours. No results for input(s): AMMONIA in the last 168 hours. Coagulation Profile: No results for input(s): INR, PROTIME in the last 168 hours. Cardiac Enzymes: No results for input(s): CKTOTAL, CKMB, CKMBINDEX, TROPONINI in the last 168 hours. BNP (last 3 results) No results for input(s): PROBNP in the last 8760 hours. HbA1C: No results for input(s): HGBA1C in the last 72 hours. CBG: Recent Labs  Lab 07/24/19 0625 07/24/19 1210 07/24/19 1606 07/24/19 2113 07/25/19 0632  GLUCAP 103* 94 99 108* 86   Lipid Profile: No results for input(s): CHOL, HDL, LDLCALC, TRIG, CHOLHDL, LDLDIRECT in the last 72 hours. Thyroid Function Tests: No results  for input(s): TSH, T4TOTAL, FREET4, T3FREE, THYROIDAB in the last 72 hours. Anemia Panel: No results for input(s): VITAMINB12, FOLATE, FERRITIN, TIBC, IRON, RETICCTPCT in the last 72 hours. Urine analysis:    Component Value Date/Time   COLORURINE STRAW (A) 09/03/2016 2220   APPEARANCEUR CLEAR (A) 09/03/2016 2220   LABSPEC 1.002 (L) 09/03/2016 2220   PHURINE 8.0 09/03/2016 2220   GLUCOSEU NEGATIVE 09/03/2016 2220   HGBUR NEGATIVE 09/03/2016 2220   BILIRUBINUR NEGATIVE 09/03/2016 2220   KETONESUR NEGATIVE 09/03/2016 2220   PROTEINUR NEGATIVE 09/03/2016 2220   NITRITE NEGATIVE 09/03/2016 2220   LEUKOCYTESUR NEGATIVE 09/03/2016 2220   Sepsis Labs: @LABRCNTIP (procalcitonin:4,lacticidven:4)  ) Recent Results (from the past 240 hour(s))  SARS CORONAVIRUS 2 (TAT 6-24 HRS) Nasopharyngeal Nasopharyngeal Swab     Status: None    Collection Time: 07/23/19  5:35 AM   Specimen: Nasopharyngeal Swab  Result Value Ref Range Status   SARS Coronavirus 2 NEGATIVE NEGATIVE Final    Comment: (NOTE) SARS-CoV-2 target nucleic acids are NOT DETECTED. The SARS-CoV-2 RNA is generally detectable in upper and lower respiratory specimens during the acute phase of infection. Negative results do not preclude SARS-CoV-2 infection, do not rule out co-infections with other pathogens, and should not be used as the sole basis for treatment or other patient management decisions. Negative results must be combined with clinical observations, patient history, and epidemiological information. The expected result is Negative. Fact Sheet for Patients: HairSlick.nohttps://www.fda.gov/media/138098/download Fact Sheet for Healthcare Providers: quierodirigir.comhttps://www.fda.gov/media/138095/download This test is not yet approved or cleared by the [place] FDA and  has been authorized for detection and/or diagnosis of SARS-CoV-2 by FDA under an Emergency Use Authorization (EUA). This EUA will remain  in effect (meaning this test can be used) for the duration of the COVID-19 declaration under Section 56 4(b)(1) of the Act, 21 U.S.C. section 360bbb-3(b)(1), unless the authorization is terminated or revoked sooner. Performed at [facility], [address] [place], [place] [address]       Studies: No results found.  Scheduled Meds: . [START ON 07/26/2019] atorvastatin  80 mg Per Tube Daily  . atovaquone  1,500 mg Per Tube BID  . enoxaparin (LOVENOX) injection  40 mg Subcutaneous Q24H  . feeding supplement (ENSURE ENLIVE)  237 mL Per Tube TID BM  . influenza vaccine adjuvanted  0.5 mL Intramuscular Tomorrow-1000  . [START ON 07/26/2019] megestrol  400 mg Per Tube Daily  . mirtazapine  15 mg Per Tube QHS  . sulfaDIAZINE  1,000 mg Per Tube Q6H  . [START ON 07/26/2019] thiamine  100 mg Per Tube Daily    Continuous Infusions: . dextrose 5 % and 0.9% NaCl        LOS: 3 days     Darlin Droparole N Omni Dunsworth, MD [facility] Pager [phone]  If 7PM-7AM, please contact night-coverage [email or web address] Password [email or web address] 07/25/2019, 10:11 AM

## 2019-07-25 NOTE — Plan of Care (Signed)
  Problem: Nutrition: Goal: Adequate nutrition will be maintained Outcome: Not Progressing   

## 2019-07-25 NOTE — Progress Notes (Signed)
RN received order to insert NG tube. RN assited with procedure and was successful . Upon rounding RN noticed that pt had pulled out NG tube. RN notified MD.

## 2019-07-25 NOTE — Progress Notes (Signed)
PMT progress note  Patient seen earlier today, opens eyes when name is called, verbalizes "yes" when name is called. Doesn't interact or verbalize beyond that. SLP evaluation done, recommended for soft diet, patient with minimal to nil PO intake so far.  Now has NGT tube and tube feeds, also with restraints. Patient becomes restless, attempting to free his hands.  BP (!) 171/81 (BP Location: Left Arm)   Pulse 73   Temp 97.6 F (36.4 C) (Oral)   Resp 20   SpO2 100%  Labs and imaging noted Chart reviewed PPS 30% Awake not alert No distress Has foley Regular S1 S2 Appears thin and weak No edema  68 yo gentleman with history of HIV, history of recent NSTEMI, history of V fib arrest, poly substance use, admitted with altered mental status, has been transferred from [facility].   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  Continue current mode of care, continue efforts to reach next of kin for overall goals of care discussions.tried reaching Mr Kayven Aldaco again with no success.  Greater than 50%  of this time was spent counseling and coordinating care related to the above assessment and plan. 15 minutes spent Loistine Chance MD [phone]

## 2019-07-25 NOTE — Progress Notes (Signed)
NG was inserted once more and pt again pulled it. MD notified. MD ordered ativan and wrist restraints, so pt wouldn't pull out NG tube. On the 3rd time NG was placed, location verified, wrist restraints applied along with medication administered. Will continue to monitor.

## 2019-07-25 NOTE — Progress Notes (Signed)
Initial Nutrition Assessment  DOCUMENTATION CODES:   Not applicable  INTERVENTION:  Recommend initiating Osmolite 1.2 @ 25 ml/hr via NGT, advance 10 ml/hr every 4 hr to goal rate 75 ml/hr (1800 ml/day)  Tube feed regimen at goal rate 75 ml/hr (1800 ml/day) provides 2160 kcal (100% of estimated needs) 100 grams protein, and 1476 ml H20 Free water flushes per MD  Recommend obtaining current weight to fully assess patient's needs   NUTRITION DIAGNOSIS:   Increased nutrient needs related to chronic illness(HIV) as evidenced by estimated needs.   GOAL:   Patient will meet greater than or equal to 90% of their needs   MONITOR:   PO intake, Supplement acceptance, Skin, Weight trends, Labs, I & O's  REASON FOR ASSESSMENT:   Consult Enteral/tube feeding initiation and management  ASSESSMENT:  RD working remotely.  68 yo gentleman with history of HIV, history of recent NSTEMI, history of V fib arrest, poly substance use, admitted with altered mental status, has been transferred from [facility]. 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  Positive for toxoplasmosis Ig G. Started on treatment by ID.  Per chart review, patient somnolent but arouses to voices. He continues with poor oral intake and NGT placed bedside today for tube feeding. No recent weight or height measured. Weight last 54.5 kg taken in 2017; Recommend obtaining measurements to fully assess needs. At this time will use available to estimate needs and will adjust needs as needed when current weight available.   NUTRITION - FOCUSED PHYSICAL EXAM: Unable to complete at this time; RD working remotely.   Diet Order:   Diet Order            DIET DYS 2 Room service appropriate? No; Fluid consistency: Thin  Diet effective now              EDUCATION NEEDS:   Not appropriate for education at this time  Skin:  Skin Assessment: Reviewed RN Assessment  Last BM:  10/29  Height:   Ht  Readings from Last 1 Encounters:  No data found for Ht    Weight:   Wt Readings from Last 1 Encounters:  No data found for Wt    Ideal Body Weight:     BMI:  There is no height or weight on file to calculate BMI.  Estimated Nutritional Needs:   Kcal:  2000-2200  Protein:  100-110 grams  Fluid:  >/= 2 L/day  Lajuan Lines, RD, LDN Clinical Nutrition [place] Telephone [phone] After Hours/Weekend Pager: [phone]

## 2019-07-25 NOTE — Progress Notes (Signed)
Patient ID: YUVAAN OLANDER, male   DOB: [birth year], 68 y.o.   MRN: [id number] BP (!) 171/81 (BP Location: Left Arm)   Pulse 73   Temp 97.6 F (36.4 C) (Oral)   Resp 20   SpO2 100%  Lethargic, not following commands Moves all extremities Noted lab findings, will assess after treating for Toxoplasmosis.  No biopsy indicated at this time.

## 2019-07-25 NOTE — Progress Notes (Signed)
PT Cancellation Note  Patient Details Name: SANTE BIEDERMANN MRN: [id number] DOB: 1951-04-10   Cancelled Treatment:    Reason Eval/Treat Not Completed: Patient at procedure or test/unavailable. Will check back as time allows.   Leighton Roach, PT  Acute Rehab Services  Pager [phone] Office [place] 07/25/2019, 2:47 PM

## 2019-07-26 LAB — GLUCOSE, CAPILLARY
Glucose-Capillary: 108 mg/dL — ABNORMAL HIGH (ref 70–99)
Glucose-Capillary: 239 mg/dL — ABNORMAL HIGH (ref 70–99)
Glucose-Capillary: 168 mg/dL — ABNORMAL HIGH (ref 70–99)
Glucose-Capillary: 205 mg/dL — ABNORMAL HIGH (ref 70–99)

## 2019-07-26 LAB — BASIC METABOLIC PANEL
Anion gap: 5 (ref 5–15)
BUN: 6 mg/dL — ABNORMAL LOW (ref 8–23)
CO2: 24 mmol/L (ref 22–32)
Calcium: 8.3 mg/dL — ABNORMAL LOW (ref 8.9–10.3)
Chloride: 102 mmol/L (ref 98–111)
Creatinine, Ser: 0.64 mg/dL (ref 0.61–1.24)
GFR calc Af Amer: >60 mL/min (ref >60)
GFR calc non Af Amer: >60 mL/min (ref >60)
Glucose, Bld: 115 mg/dL — ABNORMAL HIGH (ref 70–99)
Potassium: 4 mmol/L (ref 3.5–5.1)
Sodium: 131 mmol/L — ABNORMAL LOW (ref 135–145)

## 2019-07-26 LAB — CBC
HCT: 28.4 % — ABNORMAL LOW (ref 39.0–52.0)
Hemoglobin: 9.6 g/dL — ABNORMAL LOW (ref 13.0–17.0)
MCH: 32.4 pg (ref 26.0–34.0)
MCHC: 33.8 g/dL (ref 30.0–36.0)
MCV: 95.9 fL (ref 80.0–100.0)
Platelets: 166 10*3/uL (ref 150–400)
RBC: 2.96 MIL/uL — ABNORMAL LOW (ref 4.22–5.81)
RDW: 13.7 % (ref 11.5–15.5)
WBC: 7.8 10*3/uL (ref 4.0–10.5)
nRBC: 0 % (ref 0.0–0.2)

## 2019-07-26 LAB — PHOSPHORUS: Phosphorus: 2.8 mg/dL (ref 2.5–4.6)

## 2019-07-26 LAB — MAGNESIUM: Magnesium: 1.5 mg/dL — ABNORMAL LOW (ref 1.7–2.4)

## 2019-07-26 MED ORDER — FOLIC ACID 1 MG PO TABS
1.0000 mg | ORAL_TABLET | Freq: Every day | ORAL | Status: DC
  Administered 2019-07-26 – 2019-07-27 (×2): 1 mg via ORAL
  Filled 2019-07-26 (×2): qty 1

## 2019-07-26 MED ORDER — ALBUMIN HUMAN 25 % IV SOLN
50.0000 g | Freq: Four times a day (QID) | INTRAVENOUS | Status: AC
  Administered 2019-07-26 – 2019-07-27 (×2): 50 g via INTRAVENOUS
  Filled 2019-07-26 (×2): qty 200

## 2019-07-26 MED ORDER — ASPIRIN EC 81 MG PO TBEC
81.0000 mg | DELAYED_RELEASE_TABLET | Freq: Every day | ORAL | Status: DC
  Administered 2019-07-26 – 2019-07-29 (×4): 81 mg via ORAL
  Filled 2019-07-26 (×4): qty 1

## 2019-07-26 MED ORDER — ALBUTEROL SULFATE (2.5 MG/3ML) 0.083% IN NEBU
2.5000 mg | INHALATION_SOLUTION | Freq: Four times a day (QID) | RESPIRATORY_TRACT | Status: DC | PRN
  Administered 2019-07-29: 21:00:00 2.5 mg via RESPIRATORY_TRACT
  Filled 2019-07-26: qty 3

## 2019-07-26 MED ORDER — FREE WATER
200.0000 mL | Freq: Three times a day (TID) | Status: DC
  Administered 2019-07-26: 200 mL

## 2019-07-26 MED ORDER — METOPROLOL TARTRATE 12.5 MG HALF TABLET
12.5000 mg | ORAL_TABLET | Freq: Two times a day (BID) | ORAL | Status: DC
  Administered 2019-07-26: 12.5 mg via ORAL
  Filled 2019-07-26: qty 1

## 2019-07-26 MED ORDER — ADULT MULTIVITAMIN W/MINERALS CH
1.0000 | ORAL_TABLET | Freq: Every day | ORAL | Status: DC
  Administered 2019-07-26 – 2019-07-27 (×2): 1 via ORAL
  Filled 2019-07-26 (×2): qty 1

## 2019-07-26 MED ORDER — LORAZEPAM 1 MG PO TABS: 1.0000 mg | ORAL_TABLET | ORAL | Status: DC | PRN

## 2019-07-26 MED ORDER — GLUCAGON HCL RDNA (DIAGNOSTIC) 1 MG IJ SOLR
5.0000 mg | Freq: Once | INTRAVENOUS | Status: AC
  Administered 2019-07-26: 5 mg via INTRAVENOUS
  Filled 2019-07-26: qty 5

## 2019-07-26 MED ORDER — SODIUM CHLORIDE 0.9 % IV SOLN
INTRAVENOUS | Status: DC
  Administered 2019-07-26 – 2019-07-27 (×4): via INTRAVENOUS

## 2019-07-26 MED ORDER — ALBUTEROL SULFATE (2.5 MG/3ML) 0.083% IN NEBU
2.5000 mg | INHALATION_SOLUTION | Freq: Three times a day (TID) | RESPIRATORY_TRACT | Status: DC
  Filled 2019-07-26: qty 3

## 2019-07-26 MED ORDER — GLUCAGON HCL RDNA (DIAGNOSTIC) 1 MG IJ SOLR
INTRAMUSCULAR | Status: AC
  Filled 2019-07-26: qty 1

## 2019-07-26 MED ORDER — ALBUTEROL SULFATE (2.5 MG/3ML) 0.083% IN NEBU
2.5000 mg | INHALATION_SOLUTION | Freq: Four times a day (QID) | RESPIRATORY_TRACT | Status: DC
  Administered 2019-07-26: 14:00:00 2.5 mg via RESPIRATORY_TRACT
  Filled 2019-07-26: qty 3

## 2019-07-26 MED ORDER — ALBUMIN HUMAN 25 % IV SOLN
50.0000 g | Freq: Four times a day (QID) | INTRAVENOUS | Status: DC
  Filled 2019-07-26: qty 200

## 2019-07-26 MED ORDER — LORAZEPAM 2 MG/ML IJ SOLN: 1.0000 mg | INTRAMUSCULAR | Status: DC | PRN

## 2019-07-26 MED ORDER — QUETIAPINE FUMARATE 25 MG PO TABS
25.0000 mg | ORAL_TABLET | Freq: Two times a day (BID) | ORAL | Status: DC
  Administered 2019-07-26: 12.5 mg
  Administered 2019-07-27 – 2019-07-29 (×5): 25 mg
  Filled 2019-07-26 (×6): qty 1

## 2019-07-26 MED ORDER — LEVALBUTEROL TARTRATE 45 MCG/ACT IN AERO: 2.0000 | INHALATION_SPRAY | Freq: Three times a day (TID) | RESPIRATORY_TRACT | Status: DC

## 2019-07-26 MED ORDER — MAGNESIUM SULFATE 4 GM/100ML IV SOLN
4.0000 g | Freq: Once | INTRAVENOUS | Status: AC
  Administered 2019-07-26: 15:00:00 4 g via INTRAVENOUS
  Filled 2019-07-26: qty 100

## 2019-07-26 MED ORDER — FREE WATER
200.0000 mL | Freq: Four times a day (QID) | Status: DC
  Administered 2019-07-26 – 2019-07-29 (×11): 200 mL

## 2019-07-26 MED ORDER — SODIUM CHLORIDE 0.9 % IV BOLUS
1000.0000 mL | Freq: Once | INTRAVENOUS | Status: AC
  Administered 2019-07-26: 17:00:00 1000 mL via INTRAVENOUS

## 2019-07-26 NOTE — Progress Notes (Signed)
PT Cancellation Note  Patient Details Name: Henry Oconnell MRN: [id number] DOB: Feb 04, 1951   Cancelled Treatment:    Reason Eval/Treat Not Completed: Patient's level of consciousness - talked with RN, will return next date and initiate mobility eval once pt is alert and able to follow commands.    [facility] 07/26/2019, 2:02 PM

## 2019-07-26 NOTE — Progress Notes (Signed)
PROGRESS NOTE  Henry Oconnell OTL:[id number] DOB: 1951/08/14 DOA: 07/22/2019 PCP: Patient, No Pcp Per  HPI/Recap of past 24 hours: Henry Oconnell a 68 y.o.malewithhistory of HIV, recently admitted at [facility] for non-ST elevation MI managed conservatively, history of polysubstance abuse, history of VF arrest was recently admitted at [facility] 3 days ago for acute encephalopathy. At the time work-up in the ER with CTA did not show anything acute. 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 Given this picture patient was planned to be transferred to [facility] for further infectious disease input and also may need more advanced care. As per the patient's brother who talked with physicians at [facility] patient has not been taking his antiretrovirals for more than 7 months current CD4 count is unknown. Patient was on Plavix aspirin and statin for his non-ST relation MI. Patient also has severe protein calorie malnutrition. Last labs done at [facility] showed hemoglobin of 11.2 platelets 134 creatinine 1 potassium 4.3. MRI brain showed multiple masslike areas with a predilection for gray-white matter junction and the ventricles. 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 started on treatment by infectious disease.    Hospital course complicated by poor oral intake and delirium in the setting of CNS toxoplasmosis and HIV.  NG tube placed at bedside on 07/25/2019 for medications and nutrition.  Dietitian consulted for tube feeding.  07/26/19: Patient was seen and examined at his bedside this morning.  He is somnolent but easily arousable to voice.  He does not participate nor interact during this visit. NG tube in place.  He has bilateral wrist restraints for his own safety.  Assessment/Plan: Principal Problem:   Toxoplasma meningoencephalitis  (HCC) Active Problems:   Human immunodeficiency virus (HIV) disease (HCC)   Hepatitis C   Acute encephalopathy   Brain mass   Coronary artery disease involving native coronary artery of native heart without angina pectoris   Palliative care by specialist   Goals of care, counseling/discussion  Persistent acute metabolic encephalopathy likely secondary to CNS toxoplasmosis with HIV.   CT head done on 07/24/2019 showed stable small focus of acute subarachnoid hemorrhage over the left frontoparietal region.Resolving tiny focus of hemorrhage over the septum post and. No new areas of hemorrhage. Seen by infectious disease, following Seen by neurosurgery no plan for procedures at this time EEG completed on 07/23/2019 no seizure discharges Positive toxoplasmosis antibodies IgG Continue treatment per infectious disease  CNS toxoplasmosis and HIV Defer toxoplasmosis management to infectious disease Started on treatment on 07/24/2019 T-max 100.0 this morning. Continue atovaquone and sulfadiazine as recommended by infectious disease via NG tube Continue to hold off HIV medication while on treatment for toxoplasmosis Continue dapsone and followed G6PD  Sinus tachycardia likely secondary to fever versus intravascular volume depletion Obtain twelve-lead EKG T-max 100.0 this morning. Continue free water flushes If persistent consider Lopressor p.o. 12.5 mg BID Restart maintenance IV fluid hydration normal saline at 50 cc/h  Alcohol abuse with concern for withdrawal CIWA protocol started] Continue multivitamin, thiamine and folic acid Monitor for signs of alcohol withdrawals  RPR/hep C screening reactive Defer management to infectious disease  Dysphagia with poor oral intake status post NG tube placement on 07/25/2019 Evaluated by speech therapy on 07/24/2019 with recommendation for dysphagia 2 fine chop thin liquids Per bedside RN patient refused to take his medications NG tube placed  at bedside on 07/25/2019 Aspiration  precautions with head of bed elevated greater than 30 degree angle during feedings Dietitian consulted for feeding tube initiation and management Free water flushes 200 cc every 6 hours  Severe protein calorie malnutrition  BMI low, not calculated.  Weight 54 kg.   Continue NG tube feeding Possible cortrack placement on 07/27/19 Dietitian following.  Nutrition recommendations.  History of recent non-STEMI admitted at [facility] was conservatively managed on aspirin Plavix and statins. Continue p.o. aspirin, Plavix, and statin   History of VF arrest. Continue to monitor on telemetry  Hypertension  Blood pressure is at goal We will keep patient on as needed IV hydralazine.  Goals of care Palliative care team following Continue full scope of treatment Full code  Physical debility/ PT OT to assess For precautions     DVT prophylaxis:  Subcu Lovenox daily Code Status: Full code. Family Communication: Unable to reach family.  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  Will discharge when infectious disease signs off.  Consults called:  Infectious disease, neurosurgery, palliative care team.   Objective: Vitals:   07/25/19 2320 07/26/19 0400 07/26/19 0827 07/26/19 1201  BP: (!) 146/86 132/70 (!) 160/97 126/63  Pulse: 88 76 (!) 104 (!) 115  Resp: Temp: 97.6 F (36.4 C) 98.2 F (36.8 C) 99.1 F (37.3 C) 100 F (37.8 C)  TempSrc: Oral Oral Axillary Axillary  SpO2: 100% 99% 99% 97%    Intake/Output Summary (Last 24 hours) at 07/26/2019 1216 Last data filed at 07/26/2019 0034 Gross per 24 hour  Intake 1107.38 ml  Output 450 ml  Net 657.38 ml   There were no vitals filed for this visit.  Exam:  . General: 68 y.o. year-old male very frail appearing no acute distress.  Somnolent.  Arousable to voices.   . Cardiovascular: Regular rate and rhythm no rubs or gallops.  Respiratory:  Clear to auscultation no wheezes or rales. . Abdomen: Soft nontender nondistended bowel sounds present.  Musculoskeletal: No lower extremity edema.   Psychiatry: Unable to assess mood due to somnolence.   Data Reviewed: CBC: Recent Labs  Lab 07/23/19 0101 07/23/19 1620 07/24/19 0314 07/25/19 0651 07/26/19 0802  WBC 4.5  --  4.8 3.3* 7.8  NEUTROABS 2.0  --   --   --   --   HGB 9.5* 10.3* 9.0* 10.0* 9.6*  HCT 28.0*  --  26.6* 29.9* 28.4*  MCV 95.9  --  95.3 96.5 95.9  PLT 129*  --  143* 157 166   Basic Metabolic Panel: Recent Labs  Lab 07/23/19 0101 07/23/19 0337 07/24/19 0314 07/25/19 0651 07/26/19 0353  NA 133*  --  130* 134* 131*  K 3.4*  --  3.5 3.2* 4.0  CL 103  --  102 102 102  CO2 23  --  GLUCOSE 117*  --  116* 90 115*  BUN 6*  --  5* 5* 6*  CREATININE 0.73  --  0.71 0.73 0.64  CALCIUM 8.3*  --  7.9* 8.7* 8.3*  MG  --  1.4*  --   --  1.5*  PHOS  --   --   --   --  2.8   GFR: CrCl cannot be calculated (Unknown ideal weight.). Liver Function Tests: Recent Labs  Lab 07/23/19 0101  AST 33  ALT 25  ALKPHOS 55  BILITOT 0.6  PROT 8.4*  ALBUMIN 2.5*   No results for input(s): LIPASE, AMYLASE  in the last 168 hours. No results for input(s): AMMONIA in the last 168 hours. Coagulation Profile: No results for input(s): INR, PROTIME in the last 168 hours. Cardiac Enzymes: No results for input(s): CKTOTAL, CKMB, CKMBINDEX, TROPONINI in the last 168 hours. BNP (last 3 results) No results for input(s): PROBNP in the last 8760 hours. HbA1C: No results for input(s): HGBA1C in the last 72 hours. CBG: Recent Labs  Lab 07/25/19 1118 07/25/19 1604 07/25/19 1941 07/26/19 0308 07/26/19 1139  GLUCAP 108* 99 129* 108* 239*   Lipid Profile: No results for input(s): CHOL, HDL, LDLCALC, TRIG, CHOLHDL, LDLDIRECT in the last 72 hours. Thyroid Function Tests: No results for input(s): TSH, T4TOTAL, FREET4, T3FREE, THYROIDAB in the last 72 hours. Anemia Panel:  No results for input(s): VITAMINB12, FOLATE, FERRITIN, TIBC, IRON, RETICCTPCT in the last 72 hours. Urine analysis:    Component Value Date/Time   COLORURINE STRAW (A) 09/03/2016 2220   APPEARANCEUR CLEAR (A) 09/03/2016 2220   LABSPEC 1.002 (L) 09/03/2016 2220   PHURINE 8.0 09/03/2016 2220   GLUCOSEU NEGATIVE 09/03/2016 2220   HGBUR NEGATIVE 09/03/2016 2220   BILIRUBINUR NEGATIVE 09/03/2016 2220   KETONESUR NEGATIVE 09/03/2016 2220   PROTEINUR NEGATIVE 09/03/2016 2220   NITRITE NEGATIVE 09/03/2016 2220   LEUKOCYTESUR NEGATIVE 09/03/2016 2220   Sepsis Labs: @LABRCNTIP (procalcitonin:4,lacticidven:4)  ) Recent Results (from the past 240 hour(s))  SARS CORONAVIRUS 2 (TAT 6-24 HRS) Nasopharyngeal Nasopharyngeal Swab     Status: None   Collection Time: 07/23/19  5:35 AM   Specimen: Nasopharyngeal Swab  Result Value Ref Range Status   SARS Coronavirus 2 NEGATIVE NEGATIVE Final    Comment: (NOTE) SARS-CoV-2 target nucleic acids are NOT DETECTED. The SARS-CoV-2 RNA is generally detectable in upper and lower respiratory specimens during the acute phase of infection. Negative results do not preclude SARS-CoV-2 infection, do not rule out co-infections with other pathogens, and should not be used as the sole basis for treatment or other patient management decisions. Negative results must be combined with clinical observations, patient history, and epidemiological information. The expected result is Negative. Fact Sheet for Patients: SugarRoll.be Fact Sheet for Healthcare Providers: https://www.woods-mathews.com/ This test is not yet approved or cleared by the [place] FDA and  has been authorized for detection and/or diagnosis of SARS-CoV-2 by FDA under an Emergency Use Authorization (EUA). This EUA will remain  in effect (meaning this test can be used) for the duration of the COVID-19 declaration under Section 56 4(b)(1) of the Act, 21  U.S.C. section 360bbb-3(b)(1), unless the authorization is terminated or revoked sooner. Performed at [facility], [place] [address] [place], [place] [address]       Studies: Dg Abd Portable 1v  Result Date: 07/25/2019 CLINICAL DATA:  Gastric tube placement. EXAM: PORTABLE ABDOMEN - 1 VIEW COMPARISON:  Film earlier today. FINDINGS: Gastric decompression tube extends into the proximal stomach with the tip located in the fundus. Visualized bowel gas pattern is unremarkable. IMPRESSION: Gastric decompression tube extends into the proximal stomach with the tip located in the fundus. Electronically Signed   By: Aletta Edouard M.D.   On: 07/25/2019 14:59    Scheduled Meds: . atorvastatin  80 mg Per Tube Daily  . atovaquone  1,500 mg Per Tube BID  . clopidogrel  75 mg Per Tube Daily  . enoxaparin (LOVENOX) injection  40 mg Subcutaneous Q24H  . feeding supplement (ENSURE ENLIVE)  237 mL Per Tube TID BM  . folic acid  1 mg Oral Daily  .  free water  200 mL Per Tube Q8H  . influenza vaccine adjuvanted  0.5 mL Intramuscular Tomorrow-1000  . levalbuterol  2 puff Inhalation Q8H  . megestrol  400 mg Per Tube Daily  . mirtazapine  15 mg Per Tube QHS  . multivitamin with minerals  1 tablet Oral Daily  . QUEtiapine  25 mg Per Tube BID  . thiamine  100 mg Per Tube Daily    Continuous Infusions: . feeding supplement (OSMOLITE 1.2 CAL) 1,000 mL (07/26/19 [id number])     LOS: 4 days     Darlin Droparole N Magdiel Bartles, MD [facility] Pager [phone]  If 7PM-7AM, please contact night-coverage [email or web address] Password [email or web address] 07/26/2019, 12:16 PM

## 2019-07-27 DIAGNOSIS — R768 Other specified abnormal immunological findings in serum: Secondary | ICD-10-CM

## 2019-07-27 DIAGNOSIS — Z881 Allergy status to other antibiotic agents status: Secondary | ICD-10-CM

## 2019-07-27 DIAGNOSIS — B20 Human immunodeficiency virus [HIV] disease: Secondary | ICD-10-CM

## 2019-07-27 DIAGNOSIS — B582 Toxoplasma meningoencephalitis: Secondary | ICD-10-CM

## 2019-07-27 DIAGNOSIS — Z978 Presence of other specified devices: Secondary | ICD-10-CM

## 2019-07-27 DIAGNOSIS — G060 Intracranial abscess and granuloma: Secondary | ICD-10-CM

## 2019-07-27 LAB — GLUCOSE, CAPILLARY
Glucose-Capillary: 120 mg/dL — ABNORMAL HIGH (ref 70–99)
Glucose-Capillary: 182 mg/dL — ABNORMAL HIGH (ref 70–99)
Glucose-Capillary: 138 mg/dL — ABNORMAL HIGH (ref 70–99)
Glucose-Capillary: 114 mg/dL — ABNORMAL HIGH (ref 70–99)
Glucose-Capillary: 133 mg/dL — ABNORMAL HIGH (ref 70–99)
Glucose-Capillary: 161 mg/dL — ABNORMAL HIGH (ref 70–99)
Glucose-Capillary: 118 mg/dL — ABNORMAL HIGH (ref 70–99)
Glucose-Capillary: 118 mg/dL — ABNORMAL HIGH (ref 70–99)

## 2019-07-27 LAB — QUANTIFERON-TB GOLD PLUS (RQFGPL)
QuantiFERON Mitogen Value: >10 IU/mL
QuantiFERON Nil Value: 0.03 IU/mL
QuantiFERON TB1 Ag Value: 0.04 IU/mL
QuantiFERON TB2 Ag Value: 0.04 IU/mL

## 2019-07-27 LAB — MAGNESIUM: Magnesium: 2 mg/dL (ref 1.7–2.4)

## 2019-07-27 LAB — QUANTIFERON-TB GOLD PLUS: QuantiFERON-TB Gold Plus: NEGATIVE

## 2019-07-27 LAB — HEMOGLOBIN A1C
Hgb A1c MFr Bld: 5.2 % (ref 4.8–5.6)
Mean Plasma Glucose: 102.54 mg/dL

## 2019-07-27 LAB — T.PALLIDUM AB, TOTAL: T Pallidum Abs: REACTIVE — AB

## 2019-07-27 MED ORDER — SULFADIAZINE 500 MG PO TABS
1000.0000 mg | ORAL_TABLET | Freq: Four times a day (QID) | ORAL | Status: DC
  Administered 2019-07-27 – 2019-08-01 (×18): 1000 mg
  Filled 2019-07-27 (×25): qty 2

## 2019-07-27 MED ORDER — FOLIC ACID 1 MG PO TABS
1.0000 mg | ORAL_TABLET | Freq: Every day | ORAL | Status: DC
  Administered 2019-07-28 – 2019-07-30 (×3): 1 mg
  Filled 2019-07-27 (×3): qty 1

## 2019-07-27 MED ORDER — MIDODRINE HCL 5 MG PO TABS
10.0000 mg | ORAL_TABLET | Freq: Three times a day (TID) | ORAL | Status: DC
  Administered 2019-07-27 – 2019-07-29 (×5): 10 mg via ORAL
  Filled 2019-07-27 (×6): qty 2

## 2019-07-27 MED ORDER — ALBUMIN HUMAN 25 % IV SOLN
50.0000 g | Freq: Four times a day (QID) | INTRAVENOUS | Status: AC
  Administered 2019-07-27 (×2): 50 g via INTRAVENOUS
  Filled 2019-07-27 (×2): qty 200

## 2019-07-27 MED ORDER — ADULT MULTIVITAMIN W/MINERALS CH
1.0000 | ORAL_TABLET | Freq: Every day | ORAL | Status: DC
  Administered 2019-07-28 – 2019-07-30 (×3): 1
  Filled 2019-07-27 (×3): qty 1

## 2019-07-27 MED ORDER — INSULIN ASPART 100 UNIT/ML ~~LOC~~ SOLN
0.0000 [IU] | SUBCUTANEOUS | Status: DC
  Administered 2019-07-27: 13:00:00 1 [IU] via SUBCUTANEOUS
  Administered 2019-07-27: 2 [IU] via SUBCUTANEOUS
  Administered 2019-07-28 – 2019-07-29 (×5): 1 [IU] via SUBCUTANEOUS
  Administered 2019-08-02: 2 [IU] via SUBCUTANEOUS

## 2019-07-27 MED ORDER — SODIUM CHLORIDE 0.9 % IV BOLUS
1000.0000 mL | Freq: Once | INTRAVENOUS | Status: AC
  Administered 2019-07-27: 1000 mL via INTRAVENOUS

## 2019-07-27 MED ORDER — SULFADIAZINE 500 MG PO TABS
1000.0000 mg | ORAL_TABLET | Freq: Four times a day (QID) | ORAL | Status: DC
  Filled 2019-07-27: qty 2

## 2019-07-27 MED ORDER — MIDODRINE HCL 5 MG PO TABS
5.0000 mg | ORAL_TABLET | Freq: Three times a day (TID) | ORAL | Status: DC
  Administered 2019-07-27: 5 mg via ORAL
  Filled 2019-07-27: qty 1

## 2019-07-27 NOTE — Progress Notes (Signed)
PROGRESS NOTE  Henry Oconnell ZJQ:[id number] DOB: [birth year] DOA: 07/22/2019 PCP: Patient, No Pcp Per  HPI/Recap of past 24 hours: Henry Oconnell a 68 y.o.malewithhistory of HIV, recently admitted at [facility] for non-ST elevation MI managed conservatively, history of polysubstance abuse, history of VF arrest was recently admitted at [facility] 3 days ago for acute encephalopathy. At the time work-up in the ER with CTA did not show anything acute. 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 Given this picture patient was planned to be transferred to [facility] for further infectious disease input and also may need more advanced care. As per the patient's brother who talked with physicians at [facility] patient has not been taking his antiretrovirals for more than 7 months current CD4 count is unknown. Patient was on Plavix aspirin and statin for his non-ST relation MI. Patient also has severe protein calorie malnutrition. Last labs done at [facility] showed hemoglobin of 11.2 platelets 134 creatinine 1 potassium 4.3. MRI brain showed multiple masslike areas with a predilection for gray-white matter junction and the ventricles. 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 started on treatment by infectious disease.    Hospital course complicated by poor oral intake and delirium in the setting of CNS toxoplasmosis and HIV.  NG tube placed at bedside on 07/25/2019 for medications and nutrition.  Dietitian consulted for tube feeding.  NG tube in place.  He has bilateral wrist restraints for his own safety.  07/27/19: Patient was seen and examined at his bedside this morning.  No acute events overnight.  Little more alert today and somewhat interactive.  Assessment/Plan: Principal Problem:   Toxoplasma meningoencephalitis (HCC) Active Problems:  Human immunodeficiency virus (HIV) disease (HCC)   Hepatitis C   Acute encephalopathy   Brain mass   Coronary artery disease involving native coronary artery of native heart without angina pectoris   Palliative care by specialist   Goals of care, counseling/discussion  Improving acute metabolic encephalopathy likely secondary to CNS toxoplasmosis with HIV.   CT head done on 07/24/2019 showed stable small focus of acute subarachnoid hemorrhage over the left frontoparietal region.Resolving tiny focus of hemorrhage over the septum post and. No new areas of hemorrhage. Seen by infectious disease, following Seen by neurosurgery no plan for procedures at this time EEG completed on 07/23/2019 no seizure discharges Positive toxoplasmosis antibodies IgG Continue treatment per infectious disease  CNS toxoplasmosis and HIV Defer toxoplasmosis management to infectious disease Started on treatment on 07/24/2019 Continue atovaquone and sulfadiazine(not yet started) as recommended by infectious disease via NG tube Continue to hold off HIV medication while on treatment for toxoplasmosis Continue dapsone and follow G6PD  Transient sinus tachycardia likely secondary to fever versus intravascular volume depletion T-max 100.8 Continue IV fluid hydration Obtain twelve-lead EKG Did not tolerate beta-blocker and became hypotensive  Resolved hypotension in the setting of beta-blocker Received 1 dose of p.o. Lopressor 12.5 mg once on 07/26/19 due to sinus tachycardia.  Map dropped in the 50s, improved after reversal with IV glucagon, IV fluids and IV albumin. Maintain MAP greater than 65  Alcohol abuse with concern for withdrawal Continue CIWA protocol Continue multivitamin, thiamine and folic acid Continue to monitor for signs of alcohol withdrawals  RPR screening/hep C screening reactive Defer management to infectious disease Will follow up with infectious disease outpatient  Dysphagia with poor  oral intake status post NG tube placement  on 07/25/2019 Evaluated by speech therapy on 07/24/2019 with recommendation for dysphagia 2 fine chop thin liquids Per bedside RN patient refused to take his medications NG tube placed at bedside on 07/25/2019 Continue aspiration precautions with head of bed elevated greater than 30 degree angle during feedings Dietitian consulted for feeding tube initiation and management Continue Free water flushes 200 cc every 6 hours  Severe protein calorie malnutrition  BMI low, not calculated.  Weight 54 kg.   Continue NG tube feeding Possible cortrack placement on 07/27/19 Dietitian following.  Nutrition recommendations.  [facility] was conservatively managed on aspirin Plavix and statins. Continue p.o. aspirin, Plavix, and statin   History of VF arrest. Continue to monitor on telemetry  Goals of care Palliative care team following Continue full scope of treatment Full code  Physical debility/ PT assessed and recommended SNF, 3-n-1  CSW consulted to assist with placement. Endocrine bedside commode     DVT prophylaxis:  Subcu Lovenox daily Code Status: Full code. Family Communication: Unable to reach family.  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  Will discharge when infectious disease signs off.  Consults called:  Infectious disease, neurosurgery, palliative care team.   Objective: Vitals:   07/26/19 2301 07/27/19 0100 07/27/19 0325 07/27/19 0807  BP: 115/62 (!) 118/57 127/67 122/67  Pulse: 74  81 (!) 103  Resp: 16  18 18   Temp: 98 F (36.7 C)  97.8 F (36.6 C) (!) 100.8 F (38.2 C)  TempSrc: Axillary  Oral Oral  SpO2: 100%  100% 100%    Intake/Output Summary (Last 24 hours) at 07/27/2019 1235 Last data filed at 07/27/2019 0507 Gross per 24 hour  Intake 1349.52 ml  Output 1000 ml  Net 349.52 ml   There were no vitals filed for this visit.   Exam:  . General: 68 y.o. year-old male frail-appearing in no acute distress.  Alert and answers simple questions. . Cardiovascular: Regular rate and rhythm no rubs or gallops.  Respiratory: Clear to station no wheezes or rales. . Abdomen: Nontender nondistended with bowel sounds.  Musculoskeletal: No lower extremity edema. Psychiatry: Mood is appropriate for condition and setting.   Data Reviewed: CBC: Recent Labs  Lab 07/23/19 0101 07/23/19 1620 07/24/19 0314 07/25/19 0651 07/26/19 0802  WBC 4.5  --  4.8 3.3* 7.8  NEUTROABS 2.0  --   --   --   --   HGB 9.5* 10.3* 9.0* 10.0* 9.6*  HCT 28.0*  --  26.6* 29.9* 28.4*  MCV 95.9  --  95.3 96.5 95.9  PLT 129*  --  143* 157 166   Basic Metabolic Panel: Recent Labs  Lab 07/23/19 0101 07/23/19 0337 07/24/19 0314 07/25/19 0651 07/26/19 0353 07/27/19 0603  NA 133*  --  130* 134* 131*  --   K 3.4*  --  3.5 3.2* 4.0  --   CL 103  --  102 102 102  --   CO2 23  --  23 25 24   --   GLUCOSE 117*  --  116* 90 115*  --   BUN 6*  --  5* 5* 6*  --   CREATININE 0.73  --  0.71 0.73 0.64  --   CALCIUM 8.3*  --  7.9* 8.7* 8.3*  --   MG  --  1.4*  --   --  1.5* 2.0  PHOS  --   --   --   --  2.8  --    GFR: CrCl cannot be calculated (Unknown ideal weight.). Liver Function Tests: Recent Labs  Lab 07/23/19 0101  AST 33  ALT 25  ALKPHOS 55  BILITOT 0.6  PROT 8.4*  ALBUMIN 2.5*   No results for input(s): LIPASE, AMYLASE in the last 168 hours. No results for input(s): AMMONIA in the last 168 hours. Coagulation Profile: No results for input(s): INR, PROTIME in the last 168 hours. Cardiac Enzymes: No results for input(s): CKTOTAL, CKMB, CKMBINDEX, TROPONINI in the last 168 hours. BNP (last 3 results) No results for input(s): PROBNP in the last 8760 hours. HbA1C: Recent Labs    07/27/19 0603  HGBA1C 5.2   CBG: Recent Labs  Lab 07/25/19 1941 07/26/19 0308 07/26/19 1139 07/26/19 1925 07/26/19 2322  GLUCAP 129* 108* 239* 168*  205*   Lipid Profile: No results for input(s): CHOL, HDL, LDLCALC, TRIG, CHOLHDL, LDLDIRECT in the last 72 hours. Thyroid Function Tests: No results for input(s): TSH, T4TOTAL, FREET4, T3FREE, THYROIDAB in the last 72 hours. Anemia Panel: No results for input(s): VITAMINB12, FOLATE, FERRITIN, TIBC, IRON, RETICCTPCT in the last 72 hours. Urine analysis:    Component Value Date/Time   COLORURINE STRAW (A) 09/03/2016 2220   APPEARANCEUR CLEAR (A) 09/03/2016 2220   LABSPEC 1.002 (L) 09/03/2016 2220   PHURINE 8.0 09/03/2016 2220   GLUCOSEU NEGATIVE 09/03/2016 2220   HGBUR NEGATIVE 09/03/2016 2220   BILIRUBINUR NEGATIVE 09/03/2016 2220   KETONESUR NEGATIVE 09/03/2016 2220   PROTEINUR NEGATIVE 09/03/2016 2220   NITRITE NEGATIVE 09/03/2016 2220   LEUKOCYTESUR NEGATIVE 09/03/2016 2220   Sepsis Labs: (procalcitonin:4,lacticidven:4)  ) Recent Results (from the past 240 hour(s))  SARS CORONAVIRUS 2 (TAT 6-24 HRS) Nasopharyngeal Nasopharyngeal Swab     Status: None   Collection Time: 07/23/19  5:35 AM   Specimen: Nasopharyngeal Swab  Result Value Ref Range Status   SARS Coronavirus 2 NEGATIVE NEGATIVE Final    Comment: (NOTE) SARS-CoV-2 target nucleic acids are NOT DETECTED. The SARS-CoV-2 RNA is generally detectable in upper and lower respiratory specimens during the acute phase of infection. Negative results do not preclude SARS-CoV-2 infection, do not rule out co-infections with other pathogens, and should not be used as the sole basis for treatment or other patient management decisions. Negative results must be combined with clinical observations, patient history, and epidemiological information. The expected result is Negative. Fact Sheet for Patients: HairSlick.no Fact Sheet for Healthcare Providers: quierodirigir.com This test is not yet approved or cleared by the [place] FDA and  has been authorized for  detection and/or diagnosis of SARS-CoV-2 by FDA under an Emergency Use Authorization (EUA). This EUA will remain  in effect (meaning this test can be used) for the duration of the COVID-19 declaration under Section 56 4(b)(1) of the Act, 21 U.S.C. section 360bbb-3(b)(1), unless the authorization is terminated or revoked sooner. Performed at [facility], [address] [place], [place] [address]       Studies: No results found.  Scheduled Meds: . aspirin EC  81 mg Oral Daily  . atorvastatin  80 mg Per Tube Daily  . atovaquone  1,500 mg Per Tube BID  . clopidogrel  75 mg Per Tube Daily  . enoxaparin (LOVENOX) injection  40 mg Subcutaneous Q24H  . feeding supplement (ENSURE ENLIVE)  237 mL Per Tube TID BM  . [START ON 07/28/2019] folic acid  1 mg Per Tube Daily  . free water  200 mL Per Tube Q6H  .  influenza vaccine adjuvanted  0.5 mL Intramuscular Tomorrow-1000  . insulin aspart  0-9 Units Subcutaneous Q4H  . megestrol  400 mg Per Tube Daily  . mirtazapine  15 mg Per Tube QHS  . [START ON 07/28/2019] multivitamin with minerals  1 tablet Per Tube Daily  . QUEtiapine  25 mg Per Tube BID  . sulfaDIAZINE  1,000 mg Per Tube Q6H  . thiamine  100 mg Per Tube Daily    Continuous Infusions: . sodium chloride 100 mL/hr at 07/27/19 0507  . feeding supplement (OSMOLITE 1.2 CAL) 1,000 mL (07/27/19 1039)     LOS: 5 days     Kayleen Memos, MD [facility] Pager [phone]  If 7PM-7AM, please contact night-coverage [email or web address] Password [facility] 07/27/2019, 12:35 PM

## 2019-07-27 NOTE — Progress Notes (Signed)
Results for Henry Oconnell, Henry Oconnell (MRN [id number]) as of 07/27/2019 00:19  Ref. Range 07/26/2019 11:39 07/26/2019 19:25 07/26/2019 23:22  Glucose-Capillary Latest Ref Range: 70 - 99 mg/dL 239 (H) 168 (H) 205 (H)  Paged and notified on call MD service of elevated CBGs since on continuous feedings.

## 2019-07-27 NOTE — Progress Notes (Signed)
  NEUROSURGERY PROGRESS NOTE   Noted that patient being treated for toxoplasmosis based on work up. No need for biopsy at this time. Please re-consult as necessary.

## 2019-07-27 NOTE — [place] FL2 (Signed)
[place] MEDICAID FL2 LEVEL OF CARE SCREENING TOOL     IDENTIFICATION  Patient Name: Henry Oconnell Birthdate: 1951-06-09 Sex: male Admission Date (Current Location): 07/22/2019  [facility] and [place] Number:  Producer, television/film/video and Address:  [facility] [place], [address], [place], [place] [address]      Provider Number: [id number]  Attending Physician Name and Address:  Darlin Drop, DO  Relative Name and Phone Number:  Lazarus Sudbury, Relative, [phone]    Current Level of Care: Hospital Recommended Level of Care: Skilled Nursing Facility Prior Approval Number:    Date Approved/Denied:   PASRR Number: [id number]  Discharge Plan: SNF    Current Diagnoses: Patient Active Problem List   Diagnosis Date Noted  . 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 Palliative care by specialist   . Goals of care, counseling/discussion   . Acute encephalopathy 07/23/2019  . Brain mass 07/23/2019  . Coronary artery disease involving native coronary artery of native heart without angina pectoris   . Cocaine use disorder, moderate, dependence (HCC) 09/01/2016  . Major depressive disorder, recurrent severe without psychotic features (HCC) 08/31/2016  . Alcohol use disorder, moderate, dependence (HCC) 08/31/2016  . Suicidal ideation 08/31/2016  . Noncompliance 08/31/2016  . Tobacco use disorder 08/31/2016  . Human immunodeficiency virus (HIV) disease (HCC) 08/31/2016  . Hepatitis C 08/31/2016  . Hepatitis B 08/31/2016    Orientation RESPIRATION BLADDER Height & Weight     Self  Normal Incontinent, External catheter Weight:   Height:     BEHAVIORAL SYMPTOMS/MOOD NEUROLOGICAL BOWEL NUTRITION STATUS      Incontinent Diet, NG/panda(NG tube feed: Osmolite 1.2 cal 1,05ml continuous feeds; Diet: Dys 2 diet, thin liquids)  AMBULATORY STATUS COMMUNICATION OF NEEDS Skin   Total Care Verbally Normal(dry skin)                       Personal Care  Assistance Level of Assistance  Bathing, Feeding, Dressing, Total care Bathing Assistance: Maximum assistance Feeding assistance: Maximum assistance Dressing Assistance: Maximum assistance Total Care Assistance: Maximum assistance   Functional Limitations Info  Sight, Speech, Hearing Sight Info: Adequate Hearing Info: Adequate Speech Info: Adequate    SPECIAL CARE FACTORS FREQUENCY  PT (By licensed PT), OT (By licensed OT), Speech therapy     PT Frequency: 5x/wk OT Frequency: 5x/wk     Speech Therapy Frequency: 1x/wk      Contractures Contractures Info: Not present    Additional Factors Info  Code Status, Allergies, Psychotropic, Insulin Sliding Scale Code Status Info: Full Code Allergies Info: Sulfamethoxazole Micro (Sulfamethoxazole), Trimethoprim Psychotropic Info: remeron 15mg  daily at bedtime and seroquel 25mg  2x daily Insulin Sliding Scale Info: insulin aspart novolog 0-9 units every 4 hours       Current Medications (07/27/2019):  This is the current hospital active medication list Current Facility-Administered Medications  Medication Dose Route Frequency Provider Last Rate Last Dose  . 0.9 %  sodium chloride infusion   Intravenous Continuous N, DO 100 mL/hr at 07/27/19 0507    . acetaminophen (TYLENOL) tablet 650 mg  650 mg Per Tube Q6H PRN Dow Adolph N, DO   650 mg at 07/27/19 Dow Adolph   Or  . acetaminophen (TYLENOL) suppository 650 mg  650 mg Rectal Q6H PRN 13/02/20 N, DO      . albuterol (PROVENTIL) (2.5 MG/3ML) 0.083% nebulizer solution 2.5 mg  2.5 mg Nebulization Q6H PRN 3845 N, DO      .  aspirin EC tablet 81 mg  81 mg Oral Daily Susa Raring, RPH   81 mg at 07/27/19 1048  . atorvastatin (LIPITOR) tablet 80 mg  80 mg Per Tube Daily Irene Pap N, DO   80 mg at 07/27/19 1048  . atovaquone (MEPRON) 750 MG/5ML suspension 1,500 mg  1,500 mg Per Tube BID Irene Pap N, DO   1,500 mg at 07/27/19 0842  . clopidogrel (PLAVIX) tablet 75 mg   75 mg Per Tube Daily Irene Pap N, DO   75 mg at 07/27/19 1049  . enoxaparin (LOVENOX) injection 40 mg  40 mg Subcutaneous Q24H Irene Pap N, DO   40 mg at 07/27/19 0841  . feeding supplement (ENSURE ENLIVE) (ENSURE ENLIVE) liquid 237 mL  237 mL Per Tube TID BM Hall, Carole N, DO   237 mL at 07/27/19 1339  . feeding supplement (OSMOLITE 1.2 CAL) liquid 1,000 mL  1,000 mL Per Tube Continuous Irene Pap N, DO 75 mL/hr at 07/27/19 1039 1,000 mL at 07/27/19 1039  . [START ON 00/11/4915] folic acid (FOLVITE) tablet 1 mg  1 mg Per Tube Daily Susa Raring, RPH      . free water 200 mL  200 mL Per Tube Q6H Hall, Carole N, DO   200 mL at 07/27/19 1339  . hydrALAZINE (APRESOLINE) injection 5 mg  5 mg Intravenous Q4H PRN Rise Patience, MD      . influenza vaccine adjuvanted (FLUAD) injection 0.5 mL  0.5 mL Intramuscular Tomorrow-1000 Hall, Carole N, DO      . insulin aspart (novoLOG) injection 0-9 Units  0-9 Units Subcutaneous Q4H Irene Pap N, DO   1 Units at 07/27/19 1311  . LORazepam (ATIVAN) tablet 1-4 mg  1-4 mg Oral Q1H PRN Kayleen Memos, DO       Or  . LORazepam (ATIVAN) injection 1-4 mg  1-4 mg Intravenous Q1H PRN Irene Pap N, DO      . megestrol (MEGACE) 400 MG/10ML suspension 400 mg  400 mg Per Tube Daily Irene Pap N, DO   400 mg at 07/27/19 1048  . midodrine (PROAMATINE) tablet 10 mg  10 mg Oral TID WC Hall, Carole N, DO   10 mg at 07/27/19 1543  . mirtazapine (REMERON) tablet 15 mg  15 mg Per Tube QHS Irene Pap N, DO   15 mg at 07/25/19 2101  . [START ON 07/28/2019] multivitamin with minerals tablet 1 tablet  1 tablet Per Tube Daily Susa Raring, RPH      . ondansetron [facility]) tablet 4 mg  4 mg Per Tube Q6H PRN Irene Pap N, DO       Or  . ondansetron (ZOFRAN) injection 4 mg  4 mg Intravenous Q6H PRN Irene Pap N, DO      . QUEtiapine (SEROQUEL) tablet 25 mg  25 mg Per Tube BID Irene Pap N, DO   25 mg at 07/27/19 1048  . sulfaDIAZINE tablet 1,000 mg   1,000 mg Per Tube Q6H Susa Raring, RPH   1,000 mg at 07/27/19 1539  . thiamine (VITAMIN B-1) tablet 100 mg  100 mg Per Tube Daily Irene Pap N, DO   100 mg at 07/27/19 1048     Discharge Medications: Please see discharge summary for a list of discharge medications.  Relevant Imaging Results:  Relevant Lab Results:   Additional Information SSN: [id number]  Philippa Chester Bosco Paparella, LCSWA

## 2019-07-27 NOTE — Progress Notes (Signed)
CSW attempted to contact patient's next of kin, Henry Oconnell. CSW attempted to call the number listed, no one answered and CSW was not able to leave a message.   CSW will attempt again at a later time. CSW will continue to follow.   Domenic Schwab, MSW, [place] Worker [facility]  [phone]

## 2019-07-27 NOTE — Progress Notes (Addendum)
Occupational Therapy Evaluation Patient Details Name: Henry Oconnell MRN: [id number] DOB: 11-Feb-1951 Today's Date: 07/27/2019    History of Present Illness Henry Oconnell is a 68 y.o. male with history of HIV, recently admitted at [facility] for non-ST elevation MI managed conservatively, history of polysubstance abuse, history of VF arrest was recently admitted at [facility] 3 days ago for acute encephalopathy. MRI brain showed multiple masslike areas with a predilection for gray-white matter junction and the ventricles. 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 started on treatment by infectious disease.     Clinical Impression   PTA, pt was living at home with his sister, and reports he was independent with functional mobility and was driving. Limited information provided by pt this session. Pt currently requires totalA for feeding, maxA grooming and maxA+2 for transfer from EOB to recliner. Pt demonstrated left side inattention and maintains right gaze preference. Pt demonstrates cognitive and physical limitations impacting safety and independence with ADL and functional mobility. Due to decline in current level of function, pt would benefit from acute OT to address established goals to facilitate safe D/C to venue listed below. At this time, recommend SNF follow-up. Will continue to follow acutely.     Follow Up Recommendations  SNF;Supervision/Assistance - 24 hour    Equipment Recommendations  3 in 1 bedside commode    Recommendations for Other Services       Precautions / Restrictions Precautions Precautions: Fall Restrictions Weight Bearing Restrictions: No      Mobility Bed Mobility Overal bed mobility: Needs Assistance Bed Mobility: Supine to Sit     Supine to sit: Mod assist;+2 for physical assistance     General bed mobility comments: modA to progress BLE off EOB and progress trunk to upright position sitting EOB;modA to maintain upright  posture  Transfers Overall transfer level: Needs assistance Equipment used: Rolling walker (2 wheeled);2 person hand held assist Transfers: Sit to/from [facility] Sit to Stand: Max assist;+2 physical assistance Stand pivot transfers: Max assist;+2 safety/equipment       General transfer comment: maxA+2 for powerup, modA+2 to maintain sit<>stand;maxA+1 for stand-pivot with +2 for safety and line management    Balance Overall balance assessment: Needs assistance Sitting-balance support: Bilateral upper extremity supported;Feet supported Sitting balance-Leahy Scale: Poor Sitting balance - Comments: requires modA to maintain upright position Postural control: Posterior lean;Left lateral lean Standing balance support: Bilateral upper extremity supported Standing balance-Leahy Scale: Poor Standing balance comment: requires modA+2 for stablity in standing                           ADL either performed or assessed with clinical judgement   ADL Overall ADL's : Needs assistance/impaired Eating/Feeding: Sitting;Total assistance Eating/Feeding Details (indicate cue type and reason): requires hand over hand assistance to hold utensil bring to mouth and vc to open mouth  Grooming: Maximal assistance Grooming Details (indicate cue type and reason): cues to wash face thoroughly Upper Body Bathing: Maximal assistance;Sitting   Lower Body Bathing: Maximal assistance;+2 for physical assistance;+2 for safety/equipment;Sit to/from stand   Upper Body Dressing : Maximal assistance;Sitting   Lower Body Dressing: Maximal assistance;+2 for physical assistance;+2 for safety/equipment;Sit to/from stand   Toilet Transfer: Maximal assistance;+2 for safety/equipment;Stand-pivot Toilet Transfer Details (indicate cue type and reason): maxA+1 for physical assistance to simulate transfer from EOB to recliner;+2 for line management Toileting- Clothing Manipulation and Hygiene:  Maximal assistance;+2 for physical assistance;+2 for safety/equipment Toileting - Clothing  Manipulation Details (indicate cue type and reason): posterior pericare perfromed sit<>stand from recliner     Functional mobility during ADLs: Maximal assistance;+2 for physical assistance;+2 for safety/equipment General ADL Comments: demonstrates decreased activity tolerance, elevated HR following transfer to recliner up to 115 on tele;unsure accuracy of O2 reading on equipment, O2 taken on toe, not a good reading.      Vision Patient Visual Report: Other (comment)(pt keeps eyes closed majority of session) Vision Assessment?: Vision impaired- to be further tested in functional context Additional Comments: difficult to assess, pt keeps eyes closed throughout session;vc to open eye, eyeballs appeared to be rolled back;limited vision functionally;will further assess     Perception     Praxis      Pertinent Vitals/Pain Pain Assessment: Faces Faces Pain Scale: Hurts a little bit Pain Location: "uncomfortable" Pain Descriptors / Indicators: Grimacing Pain Intervention(s): Limited activity within patient's tolerance;Monitored during session;Repositioned     Hand Dominance Right   Extremity/Trunk Assessment Upper Extremity Assessment Upper Extremity Assessment: RUE deficits/detail;LUE deficits/detail RUE Deficits / Details: limited shoulder flexion about 100degrees AROM, full AROM elbow flexion/extension;grip strength WFL, demonstrated decreased fine motor coordination with difficulty maintaining grasp on washcloth and spoon;pt appears to resist all movements;pt demosntrated decreased body awareness/proprioception during feeding RUE Sensation: decreased light touch;decreased proprioception RUE Coordination: decreased gross motor;decreased fine motor LUE Deficits / Details: limited AROM shoulder flexion about 100degrees, full elbow flexion/extension AROM;grip strength WFL;resists all movements;reports  sensation feels better on Rside LUE Sensation: decreased light touch LUE Coordination: decreased fine motor;decreased gross motor   Lower Extremity Assessment Lower Extremity Assessment: Defer to PT evaluation;Generalized weakness;LLE deficits/detail LLE Deficits / Details: reports sensation is worse on left side   Cervical / Trunk Assessment Cervical / Trunk Assessment: (forward flexed head )   Communication Communication Communication: No difficulties   Cognition Arousal/Alertness: Awake/alert Behavior During Therapy: Flat affect Overall Cognitive Status: Impaired/Different from baseline Area of Impairment: Orientation;Attention;Memory;Following commands;Safety/judgement;Awareness;Problem solving                 Orientation Level: Disoriented to;Place;Time;Situation Current Attention Level: Focused Memory: Decreased short-term memory;Decreased recall of precautions Following Commands: Follows one step commands with increased time Safety/Judgement: Decreased awareness of safety;Decreased awareness of deficits Awareness: Intellectual Problem Solving: Slow processing;Decreased initiation;Difficulty sequencing;Requires verbal cues;Requires tactile cues General Comments: demonstrates difficulty motor planning, requires increased time for following commmands, followed one step commands;appeared to demonstrate left sided neglect (right gaze, decreased attention to left side of body and left side environment);moved BUE simultaneously;   General Comments  RN aware of HR and O2 readings;HR 115 on tele, unsure accuracy of O2 reading ranged from 70s-98% on RA pt with intermittent reports of dizziness    Exercises     Shoulder Instructions      Home Living Family/patient expects to be discharged to:: Private residence Living Arrangements: Other relatives Available Help at Discharge: Family Type of Home: Apartment Home Access: Stairs to enter [employer] of Steps: "a  lot"    Home Layout: One level     Bathroom Shower/Tub: Tub/shower unit             Additional Comments: limited information provided by pt. Pt lives with his sister      Prior Functioning/Environment Level of Independence: Independent        Comments: pt reports he was ambulatory without AD, he reports he was driving        OT Problem List: Decreased range of motion;Decreased activity tolerance;Impaired balance (sitting and/or standing);Impaired  vision/perception;Decreased coordination;Decreased cognition;Decreased safety awareness;Decreased knowledge of use of DME or AE;Decreased knowledge of precautions;Cardiopulmonary status limiting activity;Pain;Impaired UE functional use;Impaired sensation      OT Treatment/Interventions: Self-care/ADL training;Therapeutic exercise;Energy conservation;DME and/or AE instruction;Neuromuscular education;Therapeutic activities;Cognitive remediation/compensation;Visual/perceptual remediation/compensation;Patient/family education;Balance training    OT Goals(Current goals can be found in the care plan section) Acute Rehab OT Goals Patient Stated Goal: pt did not state OT Goal Formulation: Patient unable to participate in goal setting Time For Goal Achievement: 08/10/19 Potential to Achieve Goals: Good ADL Goals Pt Will Perform Eating: with supervision Pt Will Perform Grooming: with supervision Pt Will Transfer to Toilet: with min guard assist;stand pivot transfer Additional ADL Goal #1: Pt will follow 2 step commands consistently. Additional ADL Goal #2: Pt will attend to left side environment with min vc.  OT Frequency: Min 2X/week   Barriers to D/C: Decreased caregiver support  unsure pt's avaialbe support at d/c        Co-evaluation              AM-PAC OT "6 Clicks" Daily Activity     Outcome Measure Help from another person eating meals?: A Lot Help from another person taking care of personal grooming?: A Lot Help from  another person toileting, which includes using toliet, bedpan, or urinal?: A Lot Help from another person bathing (including washing, rinsing, drying)?: A Lot Help from another person to put on and taking off regular upper body clothing?: A Lot Help from another person to put on and taking off regular lower body clothing?: A Lot 6 Click Score: 12   End of Session Equipment Utilized During Treatment: Engineer, waterolling walker Nurse Communication: Mobility status  Activity Tolerance: Patient tolerated treatment well Patient left: in chair;with call bell/phone within reach;with chair alarm set;with restraints reapplied;with nursing/sitter in room  OT Visit Diagnosis: Unsteadiness on feet (R26.81);Other abnormalities of gait and mobility (R26.89);Muscle weakness (generalized) (M62.81);Low vision, both eyes (H54.2);Feeding difficulties (R63.3);Other symptoms and signs involving cognitive function;Pain Pain - part of body: (unspecified)                Time: [id number] OT Time Calculation (min): 42 min Charges:  OT General Charges $OT Visit: 1 Visit OT Evaluation $OT Eval Moderate Complexity: 1 Mod OT Treatments $Self Care/Home Management : 23-37 mins  Diona Brownereresa Olivia Pavelko OTR/L Acute Rehabilitation Services Office: [phone]   Rebeca Alerteresa J Elizah Mierzwa 07/27/2019, 11:26 AM

## 2019-07-27 NOTE — Progress Notes (Signed)
CSW acknowledges consult for SNF. CSW is following and will assist with disposition planning once PT has made a recommendations.   CSW will continue to follow.   Domenic Schwab, MSW, [place] Worker [facility]  [phone]

## 2019-07-27 NOTE — Progress Notes (Signed)
[facility] for Infectious Disease  Date of Admission:  07/22/2019      Total days of antibiotics            ASSESSMENT: Henry Oconnell is a 68 y.o. male with uncontrolled HIV, +AIDS (VL pending, CD4 189) here with encephalopathy in the setting of multiple brain abscesses. Peripheral Toxo IgG positive. He is currently being treated with Atovoquone while we await Pyrimethamine and sulfadiazine. His mental status is improved compared to last week. Will plan to repeat head imaging after 14 days on treatment to assess for treatment response. Neurologically his improvement is reassuring.   Hep C + RNA 1 million - will address outpatient for treatment after current acute needs are met.   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 At least 28 male partners over the last 12 months - will call HD to determine treatment history; titer is low and likely c/w serofast state following previously treated infection (RPR can remain positive in HIV infected persons for years even after successful treatment).   With sulfa rash will need to watch for reaction with sulfadiazine cross-reactivity.     PLAN: 1. Start Atovoquone 1500 mg BID VT Q6h  2. Start Sulfadiazine 1000 mg Q6h VT once available in place 3. Once available will start Pyrimethamine 200 mg x1 then 50 mg QD + leucovorin 25 mg QVT 4. Hold Biktarvy  5. Follow for signs of rash or intolerance to Sulfadiazine    Principal Problem:   Toxoplasma meningoencephalitis (HCC) Active Problems:   Human immunodeficiency virus (HIV) disease ([place])   Hepatitis C   Acute encephalopathy   Brain mass   Coronary artery disease involving native coronary artery of native heart without angina pectoris   Palliative care by specialist   Goals of care, counseling/discussion   . aspirin EC  81 mg Oral Daily  . atorvastatin  80 mg Per Tube Daily  . atovaquone  1,500 mg Per Tube BID  . clopidogrel  75 mg Per Tube  Daily  . enoxaparin (LOVENOX) injection  40 mg Subcutaneous Q24H  . feeding supplement (ENSURE ENLIVE)  237 mL Per Tube TID BM  . [START ON 98/11/3823] folic acid  1 mg Per Tube Daily  . free water  200 mL Per Tube Q6H  . influenza vaccine adjuvanted  0.5 mL Intramuscular Tomorrow-1000  . insulin aspart  0-9 Units Subcutaneous Q4H  . megestrol  400 mg Per Tube Daily  . mirtazapine  15 mg Per Tube QHS  . [START ON 07/28/2019] multivitamin with minerals  1 tablet Per Tube Daily  . QUEtiapine  25 mg Per Tube BID  . sulfaDIAZINE  1,000 mg Per Tube Q6H  . thiamine  100 mg Per Tube Daily    SUBJECTIVE: More awake. Can answer all questions today with simple answers. Denies headaches, vision changes or neck pain. No cough/SOB. No nausea/vomiting.   He states that he was off his [place] for "a while" and restarted them "sometime recently but not sure what month." Was not taking any other medication related to his HIV infection. No cats/birds/dogs in the home.   He states he thinks he had treatment for his hep c before but not sure what/when. Has had over 46 male sexual partners over the last 12 months from his best estimate. He does use cocaine but does not inject any illicit drugs.    Febrile o/n  to   Review of Systems: Review of Systems  Constitutional: Positive for fever and weight loss. Negative for diaphoresis.  HENT: Negative for sore throat.   Eyes: Negative for blurred vision.  Respiratory: Negative for cough, sputum production and shortness of breath.   Cardiovascular: Negative for chest pain and leg swelling.  Gastrointestinal: Negative for abdominal pain and vomiting.  Genitourinary: Negative for dysuria.  Musculoskeletal: Negative for back pain and neck pain.  Skin: Negative for rash.  Neurological: Negative for dizziness and headaches.    Allergies  Allergen Reactions  . Sulfamethoxazole Micro [Sulfamethoxazole] Rash  . Trimethoprim Rash    OBJECTIVE: Vitals:    07/26/19 2301 07/27/19 0100 07/27/19 0325 07/27/19 0807  BP: 115/62 (!) 118/57 127/67 122/67  Pulse: 74  81 (!) 103  Resp: '16  18 18  '$ Temp: 98 F (36.7 C)  97.8 F (36.6 C) (!) 100.8 F (38.2 C)  TempSrc: Axillary  Oral Oral  SpO2: 100%  100% 100%   There is no height or weight on file to calculate BMI.  Physical Exam Vitals signs and nursing note reviewed.  Constitutional:      Appearance: He is ill-appearing (chronically so).     Comments: Awakens and opens eyes. Able to converse today. Resting comfortably.   HENT:     Mouth/Throat:     Mouth: Mucous membranes are dry.     Pharynx: No oropharyngeal exudate.  Eyes:     General: No scleral icterus.    Pupils: Pupils are equal, round, and reactive to light.  Cardiovascular:     Rate and Rhythm: Normal rate and regular rhythm.     Heart sounds: No murmur.  Pulmonary:     Effort: Pulmonary effort is normal.     Breath sounds: Normal breath sounds.  Abdominal:     General: There is no distension.     Tenderness: There is no abdominal tenderness.  Musculoskeletal: Normal range of motion.  Skin:    General: Skin is warm and dry.     Capillary Refill: Capillary refill takes less than 2 seconds.  Neurological:     Comments: Oriented to year, place; can give his brother's name. Speech slurred intermittently.   Psychiatric:        Mood and Affect: Mood normal.     Lab Results Lab Results  Component Value Date   WBC 7.8 07/26/2019   HGB 9.6 (L) 07/26/2019   HCT 28.4 (L) 07/26/2019   MCV 95.9 07/26/2019   PLT 166 07/26/2019    Lab Results  Component Value Date   CREATININE 0.64 07/26/2019   BUN 6 (L) 07/26/2019   NA 131 (L) 07/26/2019   K 4.0 07/26/2019   CL 102 07/26/2019   CO2 24 07/26/2019    Lab Results  Component Value Date   ALT 25 07/23/2019   AST 33 07/23/2019   ALKPHOS 55 07/23/2019   BILITOT 0.6 07/23/2019     Microbiology: Recent Results (from the past 240 hour(s))  SARS CORONAVIRUS 2 (TAT 6-24  HRS) Nasopharyngeal Nasopharyngeal Swab     Status: None   Collection Time: 07/23/19  5:35 AM   Specimen: Nasopharyngeal Swab  Result Value Ref Range Status   SARS Coronavirus 2 NEGATIVE NEGATIVE Final    Comment: (NOTE) SARS-CoV-2 target nucleic acids are NOT DETECTED. The SARS-CoV-2 RNA is generally detectable in upper and lower respiratory specimens during the acute phase of infection. Negative results do not preclude SARS-CoV-2 infection, do not rule out co-infections  with other pathogens, and should not be used as the sole basis for treatment or other patient management decisions. Negative results must be combined with clinical observations, patient history, and epidemiological information. The expected result is Negative. Fact Sheet for Patients: SugarRoll.be Fact Sheet for Healthcare Providers: https://www.woods-mathews.com/ This test is not yet approved or cleared by the [place] FDA and  has been authorized for detection and/or diagnosis of SARS-CoV-2 by FDA under an Emergency Use Authorization (EUA). This EUA will remain  in effect (meaning this test can be used) for the duration of the COVID-19 declaration under Section 56 4(b)(1) of the Act, 21 U.S.C. section 360bbb-3(b)(1), unless the authorization is terminated or revoked sooner. Performed at [facility], [place] [address] [place], [place] [address]     Janene Madeira, MSN, NP-C [facility] for Infectious Disease [email or web address] Pager: [phone] Office: [phone] [place]: [phone]

## 2019-07-27 NOTE — Progress Notes (Signed)
Called patient's son to give updates via phone. No answer. Will try again later.

## 2019-07-27 NOTE — Evaluation (Signed)
Physical Therapy Evaluation Patient Details Name: Henry Oconnell MRN: [id number] DOB: 1951-06-15 Today's Date: 07/27/2019   History of Present Illness  Henry Oconnell is a 68 y.o. male with history of HIV, recently admitted at [facility] for non-ST elevation MI managed conservatively, history of polysubstance abuse, history of VF arrest was recently admitted at [facility] 3 days ago for acute encephalopathy. MRI brain showed multiple masslike areas with a predilection for gray-white matter junction and the ventricles. 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 started on treatment by infectious disease.    Clinical Impression  Pt admitted with above diagnosis. Pt lethargic on eval, had been up in chair with nursing in AM. Presents with L inattention and decreased ability to follow commands. Unable to facilitate pt crossing midline to L with head or eyes.  Required mod/ max A for bed mobility and max A to stand EOB. Pt currently with functional limitations due to the deficits listed below (see PT Problem List). Pt will benefit from skilled PT to increase their independence and safety with mobility to allow discharge to the venue listed below.       Follow Up Recommendations SNF;Supervision/Assistance - 24 hour    Equipment Recommendations  None recommended by PT    Recommendations for Other Services       Precautions / Restrictions Precautions Precautions: Fall Restrictions Weight Bearing Restrictions: No      Mobility  Bed Mobility Overal bed mobility: Needs Assistance Bed Mobility: Supine to Sit;Sit to Supine     Supine to sit: Mod assist Sit to supine: Max assist   General bed mobility comments: pt did not initiate mobility with vc's, needed tactile stimulation. Mod A for LE's off bed and elevation of trunk as well as scooting to EOB. Max A for elevation of LE's back into bed  Transfers Overall transfer level: Needs assistance Equipment used: None Transfers:  Sit to/from Stand Sit to Stand: Max assist         General transfer comment: max A for power up and pt unable to effectively step feet in standing, L lean noted. Pt had already been to chair this morning with nursing and needed +2 to return to bed. Did not get back in chair due to lethargy  Ambulation/Gait             General Gait Details: unable with safety  Stairs            Wheelchair Mobility    Modified Rankin (Stroke Patients Only)       Balance Overall balance assessment: Needs assistance Sitting-balance support: Bilateral upper extremity supported;Feet supported Sitting balance-Leahy Scale: Poor Sitting balance - Comments: requires modA to maintain upright position Postural control: Posterior lean;Left lateral lean Standing balance support: Bilateral upper extremity supported Standing balance-Leahy Scale: Poor Standing balance comment: max A needed to maintain standing                             Pertinent Vitals/Pain Pain Assessment: No/denies pain    Home Living Family/patient expects to be discharged to:: Private residence Living Arrangements: Other relatives Available Help at Discharge: Family Type of Home: Apartment Home Access: Stairs to enter   Technical brewer of Steps: "a lot"  Home Layout: One level Home Equipment: Walker - 2 wheels Additional Comments: pt reports that he lives with his sister and walks with a walker. Questionable historian due to AMS    Prior Function  Level of Independence: Independent         Comments: per pt report, but objectively unknown     Hand Dominance   Dominant Hand: Right    Extremity/Trunk Assessment   Upper Extremity Assessment Upper Extremity Assessment: Defer to OT evaluation    Lower Extremity Assessment Lower Extremity Assessment: Generalized weakness;RLE deficits/detail;LLE deficits/detail;Difficult to assess due to impaired cognition RLE Deficits / Details: stiffness  noted BLE's, difficult to assess because pt following few commands.  RLE Coordination: decreased gross motor;decreased fine motor LLE Deficits / Details: seemed weaker on L than R LLE Coordination: decreased gross motor;decreased fine motor    Cervical / Trunk Assessment Cervical / Trunk Assessment: Normal  Communication   Communication: No difficulties  Cognition Arousal/Alertness: Lethargic;Suspect due to medications Behavior During Therapy: Flat affect Overall Cognitive Status: Impaired/Different from baseline Area of Impairment: Orientation;Attention;Memory;Following commands;Safety/judgement;Awareness;Problem solving                 Orientation Level: Disoriented to;Place;Time;Situation Current Attention Level: Focused Memory: Decreased short-term memory;Decreased recall of precautions Following Commands: Follows one step commands with increased time Safety/Judgement: Decreased awareness of safety;Decreased awareness of deficits Awareness: Intellectual Problem Solving: Slow processing;Decreased initiation;Difficulty sequencing;Requires verbal cues;Requires tactile cues General Comments: noted L neglect and difficulty with motor planning      General Comments General comments (skin integrity, edema, etc.): persistent R gaze, attempted to manually turn head to L and pt would not allow    Exercises     Assessment/Plan    PT Assessment Patient needs continued PT services  PT Problem List Decreased strength;Decreased range of motion;Decreased activity tolerance;Decreased balance;Decreased mobility;Decreased coordination;Decreased cognition;Decreased knowledge of use of DME;Decreased safety awareness;Decreased knowledge of precautions;Impaired sensation       PT Treatment Interventions DME instruction;Gait training;Functional mobility training;Therapeutic activities;Therapeutic exercise;Balance training;Neuromuscular re-education;Cognitive remediation;Patient/family  education    PT Goals (Current goals can be found in the Care Plan section)  Acute Rehab PT Goals Patient Stated Goal: pt did not state PT Goal Formulation: Patient unable to participate in goal setting Time For Goal Achievement: 08/10/19 Potential to Achieve Goals: Fair    Frequency Min 2X/week   Barriers to discharge        Co-evaluation               AM-PAC PT "6 Clicks" Mobility  Outcome Measure Help needed turning from your back to your side while in a flat bed without using bedrails?: A Lot Help needed moving from lying on your back to sitting on the side of a flat bed without using bedrails?: A Lot Help needed moving to and from a bed to a chair (including a wheelchair)?: Total Help needed standing up from a chair using your arms (e.g., wheelchair or bedside chair)?: Total Help needed to walk in hospital room?: Total Help needed climbing 3-5 steps with a railing? : Total 6 Click Score: 8    End of Session   Activity Tolerance: Patient limited by lethargy Patient left: in bed;with bed alarm set;with call bell/phone within reach;with restraints reapplied Nurse Communication: Mobility status PT Visit Diagnosis: Unsteadiness on feet (R26.81);Muscle weakness (generalized) (M62.81);Difficulty in walking, not elsewhere classified (R26.2)    Time: 9892-1194 PT Time Calculation (min) (ACUTE ONLY): 15 min   Charges:   PT Evaluation $PT Eval Moderate Complexity: 1 Mod          Lyanne Co, PT  Acute Rehab Services  Pager [phone] Office [phone]   Lawana Chambers Haleigh Desmith 07/27/2019, 1:54 PM

## 2019-07-27 NOTE — Progress Notes (Signed)
UPDATE: Hypotensive this afternoon. Went to see him in his room. Alert and answers simple questions. Not in distress. Continue NS infusion at 100 cc/hr.   Received IV fluid bolus 1L NS. Added IV albumin 50 g x 2 doses, and midodrine. Will repeat labs in the am and continue to closely monitor vital signs.

## 2019-07-28 ENCOUNTER — Inpatient Hospital Stay (HOSPITAL_COMMUNITY): Payer: Medicare Other

## 2019-07-28 DIAGNOSIS — G92 Toxic encephalopathy: Secondary | ICD-10-CM

## 2019-07-28 DIAGNOSIS — I5031 Acute diastolic (congestive) heart failure: Secondary | ICD-10-CM

## 2019-07-28 LAB — BRAIN NATRIURETIC PEPTIDE: B Natriuretic Peptide: 186.1 pg/mL — ABNORMAL HIGH (ref 0.0–100.0)

## 2019-07-28 LAB — CBC WITH DIFFERENTIAL/PLATELET
Abs Immature Granulocytes: 0.04 10*3/uL (ref 0.00–0.07)
Basophils Absolute: 0 10*3/uL (ref 0.0–0.1)
Basophils Relative: 0 %
Eosinophils Absolute: 0.1 10*3/uL (ref 0.0–0.5)
Eosinophils Relative: 1 %
HCT: 23.6 % — ABNORMAL LOW (ref 39.0–52.0)
Hemoglobin: 7.8 g/dL — ABNORMAL LOW (ref 13.0–17.0)
Immature Granulocytes: 1 %
Lymphocytes Relative: 22 %
Lymphs Abs: 1.3 10*3/uL (ref 0.7–4.0)
MCH: 32.1 pg (ref 26.0–34.0)
MCHC: 33.1 g/dL (ref 30.0–36.0)
MCV: 97.1 fL (ref 80.0–100.0)
Monocytes Absolute: 0.5 10*3/uL (ref 0.1–1.0)
Monocytes Relative: 8 %
Neutro Abs: 4 10*3/uL (ref 1.7–7.7)
Neutrophils Relative %: 68 %
Platelets: 171 10*3/uL (ref 150–400)
RBC: 2.43 MIL/uL — ABNORMAL LOW (ref 4.22–5.81)
RDW: 14.5 % (ref 11.5–15.5)
WBC: 6 10*3/uL (ref 4.0–10.5)
nRBC: 0 % (ref 0.0–0.2)

## 2019-07-28 LAB — PROCALCITONIN: Procalcitonin: 8.27 ng/mL

## 2019-07-28 LAB — GLUCOSE, CAPILLARY
Glucose-Capillary: 105 mg/dL — ABNORMAL HIGH (ref 70–99)
Glucose-Capillary: 111 mg/dL — ABNORMAL HIGH (ref 70–99)
Glucose-Capillary: 122 mg/dL — ABNORMAL HIGH (ref 70–99)
Glucose-Capillary: 127 mg/dL — ABNORMAL HIGH (ref 70–99)
Glucose-Capillary: 140 mg/dL — ABNORMAL HIGH (ref 70–99)
Glucose-Capillary: 150 mg/dL — ABNORMAL HIGH (ref 70–99)

## 2019-07-28 LAB — BASIC METABOLIC PANEL
Anion gap: 10 (ref 5–15)
BUN: 16 mg/dL (ref 8–23)
CO2: 21 mmol/L — ABNORMAL LOW (ref 22–32)
Calcium: 8.4 mg/dL — ABNORMAL LOW (ref 8.9–10.3)
Chloride: 105 mmol/L (ref 98–111)
Creatinine, Ser: 0.58 mg/dL — ABNORMAL LOW (ref 0.61–1.24)
GFR calc Af Amer: >60 mL/min (ref >60)
GFR calc non Af Amer: >60 mL/min (ref >60)
Glucose, Bld: 125 mg/dL — ABNORMAL HIGH (ref 70–99)
Potassium: 4 mmol/L (ref 3.5–5.1)
Sodium: 136 mmol/L (ref 135–145)

## 2019-07-28 LAB — MRSA PCR SCREENING: MRSA by PCR: NEGATIVE

## 2019-07-28 LAB — LACTIC ACID, PLASMA: Lactic Acid, Venous: 1.4 mmol/L (ref 0.5–1.9)

## 2019-07-28 LAB — ECHOCARDIOGRAM COMPLETE

## 2019-07-28 MED ORDER — ALBUMIN HUMAN 25 % IV SOLN
12.5000 g | Freq: Three times a day (TID) | INTRAVENOUS | Status: AC
  Administered 2019-07-28 – 2019-07-29 (×3): 12.5 g via INTRAVENOUS
  Filled 2019-07-28 (×3): qty 50

## 2019-07-28 MED ORDER — SODIUM CHLORIDE 3 % IN NEBU
4.0000 mL | INHALATION_SOLUTION | Freq: Two times a day (BID) | RESPIRATORY_TRACT | Status: AC
  Administered 2019-07-28 – 2019-07-30 (×6): 4 mL via RESPIRATORY_TRACT
  Filled 2019-07-28 (×6): qty 4

## 2019-07-28 MED ORDER — GUAIFENESIN 100 MG/5ML PO SOLN
15.0000 mL | Freq: Two times a day (BID) | ORAL | Status: AC
  Administered 2019-07-28 – 2019-07-30 (×6): 300 mg
  Filled 2019-07-28: qty 15
  Filled 2019-07-28: qty 10
  Filled 2019-07-28 (×2): qty 15
  Filled 2019-07-28 (×3): qty 5

## 2019-07-28 MED ORDER — PIPERACILLIN-TAZOBACTAM 3.375 G IVPB 30 MIN
3.3750 g | Freq: Once | INTRAVENOUS | Status: AC
  Administered 2019-07-28: 3.375 g via INTRAVENOUS
  Filled 2019-07-28 (×2): qty 50

## 2019-07-28 MED ORDER — PYRIMETHAMINE-LEUCOVORIN 50-25 MG PO CAPS
50.0000 mg | ORAL_CAPSULE | Freq: Every day | ORAL | Status: DC
  Administered 2019-07-29: 50 mg via ORAL
  Filled 2019-07-28 (×2): qty 1

## 2019-07-28 MED ORDER — VANCOMYCIN HCL IN DEXTROSE 1-5 GM/200ML-% IV SOLN
1000.0000 mg | INTRAVENOUS | Status: DC
  Administered 2019-07-28: 18:00:00 1000 mg via INTRAVENOUS
  Filled 2019-07-28 (×2): qty 200

## 2019-07-28 MED ORDER — PYRIMETHAMINE-LEUCOVORIN 50-25 MG PO CAPS
200.0000 mg | ORAL_CAPSULE | Freq: Once | ORAL | Status: AC
  Administered 2019-07-28: 200 mg via ORAL
  Filled 2019-07-28: qty 4

## 2019-07-28 MED ORDER — PIPERACILLIN-TAZOBACTAM 3.375 G IVPB
3.3750 g | Freq: Three times a day (TID) | INTRAVENOUS | Status: DC
  Administered 2019-07-28 – 2019-07-29 (×2): 3.375 g via INTRAVENOUS
  Filled 2019-07-28 (×3): qty 50

## 2019-07-28 NOTE — Progress Notes (Signed)
Pharmacy Antibiotic Note  Henry Oconnell is a 68 y.o. male admitted on 07/22/2019 with encephalopathy in setting of multiple brain abscesses. Peripheral toxo IgG positive. Pharmacy has been consulted for vancomycin/zosyn dosing for sepsis. Patient already on treatment per ID for toxoplasma encephalitis. SCr stable 0.58.  Height 5'9", estimated weight 115 lbs per discussion with RN  Plan: Vancomycin 1000 mg IV Q 24 hrs. Goal AUC 400-550. Expected AUC: 453 SCr used: 0.8 Zosyn 3.375g IV (69min infusion) x1; then 3.375g IV q8h (4h infusion) Monitor clinical progress, c/s, renal function F/u de-escalation plan/LOT, vancomycin levels as indicated      Temp (24hrs), Avg:98.5 F (36.9 C), Min:97.6 F (36.4 C), Max:99.7 F (37.6 C)  Recent Labs  Lab 07/23/19 0101 07/24/19 0314 07/25/19 0651 07/26/19 0353 07/26/19 0802 07/28/19 1112  WBC 4.5 4.8 3.3*  --  7.8 6.0  CREATININE 0.73 0.71 0.73 0.64  --  0.58*  LATICACIDVEN  --   --   --   --   --  1.4    CrCl cannot be calculated (Unknown ideal weight.).    Allergies  Allergen Reactions  . Sulfamethoxazole Micro [Sulfamethoxazole] Rash  . Trimethoprim Rash    Elicia Lamp, PharmD, BCPS Please check AMION for all [facility] contact numbers Clinical Pharmacist 07/28/2019 2:46 PM

## 2019-07-28 NOTE — Progress Notes (Signed)
[facility] for Infectious Disease  Date of Admission:  07/22/2019      Total days of antibiotics   Day 4 atovaquone  Day 2 sulfadiazine            ASSESSMENT: Henry Oconnell is a 68 y.o. male with HIV, +AIDS (VL 90, CD4 189) here with encephalopathy in the setting of multiple brain abscesses. Peripheral Toxo IgG positive.  Toxoplasma Encephalitis -  His mental status continues to show improvement on atovaquone and sulfadiazine. Pyrimethamine will be here this afternoon so he can get his loading dose of 200 mg then daily 50 mg.  Will need repeat head MRI on Friday 11/13 to follow for treatment response.   Hep C + RNA 1 million - will address outpatient for treatment after current acute needs are met.   +RPR - serofast compared to previous titers. No treatment indicated   ?sulfa drug rash - no symptoms suggesting rash with sulfadiazine so far. Continue and monitor.   Rhonchi - he has a decreased cough effort but was able to clear with coaching. Would probably do well with flutter valve to help mobilize secretions and prevent pneumonia. TF's infusing and appears to be tolerating without emesis.     PLAN: 1. Continue Atovaquone until Pyrimethamine arrives 2. Continue Sulfadiazine 1000 mg Q6h VT  3. Once available will start Pyrimethamine 200 mg x1 then 50 mg QD + leucovorin 25 mg QVT 4. Hold Biktarvy  5. Follow for signs of rash or intolerance to Sulfadiazine 6. Consider flutter valve   Principal Problem:   Toxoplasma meningoencephalitis ([place]) Active Problems:   Human immunodeficiency virus (HIV) disease ([place])   Hepatitis C   Acute encephalopathy   Brain mass   Coronary artery disease involving native coronary artery of native heart without angina pectoris   Palliative care by specialist   Goals of care, counseling/discussion   . aspirin EC  81 mg Oral Daily  . atorvastatin  80 mg Per Tube Daily  . atovaquone  1,500 mg Per Tube BID  . clopidogrel  75  mg Per Tube Daily  . enoxaparin (LOVENOX) injection  40 mg Subcutaneous Q24H  . feeding supplement (ENSURE ENLIVE)  237 mL Per Tube TID BM  . folic acid  1 mg Per Tube Daily  . free water  200 mL Per Tube Q6H  . influenza vaccine adjuvanted  0.5 mL Intramuscular Tomorrow-1000  . insulin aspart  0-9 Units Subcutaneous Q4H  . megestrol  400 mg Per Tube Daily  . midodrine  10 mg Oral TID WC  . mirtazapine  15 mg Per Tube QHS  . multivitamin with minerals  1 tablet Per Tube Daily  . QUEtiapine  25 mg Per Tube BID  . sulfaDIAZINE  1,000 mg Per Tube Q6H  . thiamine  100 mg Per Tube Daily    SUBJECTIVE: He is feeling better. No headaches or vision changes. He does have a cough and rattling in chest/upper airways.    Interval additions -  Afebrile o/n   Review of Systems: Review of Systems  Constitutional: Positive for weight loss. Negative for diaphoresis and fever.  HENT: Negative for sore throat.   Eyes: Negative for blurred vision.  Respiratory: Negative for cough, sputum production and shortness of breath.   Cardiovascular: Negative for chest pain and leg swelling.  Gastrointestinal: Negative for abdominal pain and vomiting.  Genitourinary: Negative for dysuria.  Musculoskeletal: Negative for back pain and neck  pain.  Skin: Negative for rash.  Neurological: Negative for dizziness and headaches.    Allergies  Allergen Reactions  . Sulfamethoxazole Micro [Sulfamethoxazole] Rash  . Trimethoprim Rash    OBJECTIVE: Vitals:   07/27/19 2316 07/28/19 0200 07/28/19 0300 07/28/19 0720  BP: 101/72 (!) 103/57 (!) 103/59 (!) 113/59  Pulse: 88  91 93  Resp: '18  20 20  '$ Temp: 98.5 F (36.9 C)   97.6 F (36.4 C)  TempSrc: Oral   Axillary  SpO2: 94%  96% 98%   There is no height or weight on file to calculate BMI.  Physical Exam Vitals signs and nursing note reviewed.  Constitutional:      Appearance: He is ill-appearing (chronically so).     Comments: Awakens and opens eyes.  Able to converse today. Resting comfortably.   HENT:     Mouth/Throat:     Mouth: Mucous membranes are dry.     Pharynx: No oropharyngeal exudate.  Eyes:     General: No scleral icterus.    Pupils: Pupils are equal, round, and reactive to light.  Cardiovascular:     Rate and Rhythm: Normal rate and regular rhythm.     Heart sounds: No murmur.  Pulmonary:     Effort: Pulmonary effort is normal.     Breath sounds: Rhonchi present.     Comments: Lots of upper airway rhonchi. Clears with effort but needs coaching for cough.  Abdominal:     General: There is no distension.     Tenderness: There is no abdominal tenderness.  Musculoskeletal: Normal range of motion.  Skin:    General: Skin is warm and dry.     Capillary Refill: Capillary refill takes less than 2 seconds.  Neurological:     Comments: Oriented to year, place; can give his brother's name. Speech slurred intermittently.   Psychiatric:        Mood and Affect: Mood normal.     Lab Results Lab Results  Component Value Date   WBC 7.8 07/26/2019   HGB 9.6 (L) 07/26/2019   HCT 28.4 (L) 07/26/2019   MCV 95.9 07/26/2019   PLT 166 07/26/2019    Lab Results  Component Value Date   CREATININE 0.64 07/26/2019   BUN 6 (L) 07/26/2019   NA 131 (L) 07/26/2019   K 4.0 07/26/2019   CL 102 07/26/2019   CO2 24 07/26/2019    Lab Results  Component Value Date   ALT 25 07/23/2019   AST 33 07/23/2019   ALKPHOS 55 07/23/2019   BILITOT 0.6 07/23/2019     Microbiology: Recent Results (from the past 240 hour(s))  SARS CORONAVIRUS 2 (TAT 6-24 HRS) Nasopharyngeal Nasopharyngeal Swab     Status: None   Collection Time: 07/23/19  5:35 AM   Specimen: Nasopharyngeal Swab  Result Value Ref Range Status   SARS Coronavirus 2 NEGATIVE NEGATIVE Final    Comment: (NOTE) SARS-CoV-2 target nucleic acids are NOT DETECTED. The SARS-CoV-2 RNA is generally detectable in upper and lower respiratory specimens during the acute phase of  infection. Negative results do not preclude SARS-CoV-2 infection, do not rule out co-infections with other pathogens, and should not be used as the sole basis for treatment or other patient management decisions. Negative results must be combined with clinical observations, patient history, and epidemiological information. The expected result is Negative. Fact Sheet for Patients: SugarRoll.be Fact Sheet for Healthcare Providers: https://www.woods-mathews.com/ This test is not yet approved or cleared by the [place] FDA  and  has been authorized for detection and/or diagnosis of SARS-CoV-2 by FDA under an Emergency Use Authorization (EUA). This EUA will remain  in effect (meaning this test can be used) for the duration of the COVID-19 declaration under Section 56 4(b)(1) of the Act, 21 U.S.C. section 360bbb-3(b)(1), unless the authorization is terminated or revoked sooner. Performed at [facility], [place] [address] [place], [place] [address]     Janene Madeira, MSN, NP-C [facility] for Infectious Disease [email or web address] Pager: [phone] Office: [phone] [place]: [phone]

## 2019-07-28 NOTE — TOC Initial Note (Signed)
Transition of Care [facility]) - Initial/Assessment Note    Patient Details  Name: Henry Oconnell MRN: [id number] Date of Birth: 12-14-1950  Transition of Care [facility]) CM/SW Contact:    Pollie Friar, RN Phone Number: 07/28/2019, 2:07 PM  Clinical Narrative:                 CM was able to find the correct number for Henry Oconnell and go over the recommendations for SNF rehab. Hulen Skains was agreeable to having the patient go to SNF rehab at d/c. He asked that he be faxed out to any area that may offer him a bed.  Hulen Skains states he has his own health issues and can not provide the needed support at home the patient needs.  TOC following.  Expected Discharge Plan: Skilled Nursing Facility Barriers to Discharge: Continued Medical Work up   Patient Goals and CMS Choice   CMS Medicare.gov Compare Post Acute Care list provided to:: Patient Represenative (must comment) Choice offered to / list presented to : Sibling(Brother--Henry Oconnell)  Expected Discharge Plan and Services Expected Discharge Plan: [place] In-house Referral: Clinical Social Work Discharge Planning Services: CM Consult Post Acute Care Choice: [place]                                        Prior Living Arrangements/Services   Lives with:: Siblings          Need for Family Participation in Patient Care: Yes (Comment)(24 hour care) Care giver support system in place?: No (comment)(brother states he can not provide needed assistance at home)   Criminal Activity/Legal Involvement Pertinent to Current Situation/Hospitalization: No - Comment as needed  Activities of Daily Living      Permission Sought/Granted                  Emotional Assessment Appearance:: Appears stated age         Psych Involvement: No (comment)  Admission diagnosis:  infection Patient Active Problem List   Diagnosis Date Noted  . 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 Palliative care by specialist    . Goals of care, counseling/discussion   . Acute encephalopathy 07/23/2019  . Brain mass 07/23/2019  . Coronary artery disease involving native coronary artery of native heart without angina pectoris   . Cocaine use disorder, moderate, dependence ([place]) 09/01/2016  . Major depressive disorder, recurrent severe without psychotic features ([place]) 08/31/2016  . Alcohol use disorder, moderate, dependence ([place]) 08/31/2016  . Suicidal ideation 08/31/2016  . Noncompliance 08/31/2016  . Tobacco use disorder 08/31/2016  . Human immunodeficiency virus (HIV) disease ([place]) 08/31/2016  . Hepatitis C 08/31/2016  . Hepatitis B 08/31/2016   PCP:  Patient, No Pcp Per Pharmacy:   [facility], [address] [place] [address] Phone: [phone] Fax: [phone]  [facility] [phone] - [place], [address] AT [place] [address]  [place] [address] Phone: [phone] Fax: [phone]     Social Determinants of Health (SDOH) Interventions    Readmission Risk Interventions No flowsheet data found.

## 2019-07-28 NOTE — Progress Notes (Addendum)
PROGRESS NOTE  Henry Oconnell ZOX:[id number] DOB: 05/31/1951 DOA: 07/22/2019 PCP: Patient, No Pcp Per  HPI/Recap of past 24 hours: Henry Oconnell a 68 y.o.malewithhistory of HIV, recently admitted at [facility] for non-ST elevation MI managed conservatively, history of polysubstance abuse, history of VF arrest was recently admitted at [facility] 3 days ago for acute encephalopathy. At the time work-up in the ER with CTA did not show anything acute. 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 Given this picture patient was planned to be transferred to [facility] for further infectious disease input and also may need more advanced care. As per the patient's brother who talked with physicians at [facility] patient has not been taking his antiretrovirals for more than 7 months current CD4 count is unknown. Patient was on Plavix aspirin and statin for his non-ST relation MI. Patient also has severe protein calorie malnutrition. Last labs done at [facility] showed hemoglobin of 11.2 platelets 134 creatinine 1 potassium 4.3. MRI brain showed multiple masslike areas with a predilection for gray-white matter junction and the ventricles. 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 started on treatment by infectious disease.    Hospital course complicated by poor oral intake and delirium in the setting of CNS toxoplasmosis and HIV.  Resolving.  Hypotension for which he was started on midodrine 10 mg 3 times daily and given IV fluid boluses.  NG tube placed at bedside on 07/25/2019 for medications and nutrition.    07/28/19: Patient was seen and examined at his bedside this morning.  Rhonchorous sounds noted on exam.  Chest x-ray personally reviewed showed cardiomegaly and increase pulmonary vascularity with bilateral pleural effusion.  BNP and TTE ordered.   Unable to dialyzed due to soft blood pressures.  DC IV fluid and replaced with IV albumin 50 g 3 times daily x 1 day to maintain MAP greater than 65.   Assessment/Plan: Principal Problem:   Toxoplasma meningoencephalitis (HCC) Active Problems:   Human immunodeficiency virus (HIV) disease (HCC)   Hepatitis C   Acute encephalopathy   Brain mass   Coronary artery disease involving native coronary artery of native heart without angina pectoris   Palliative care by specialist   Goals of care, counseling/discussion  Improving acute metabolic encephalopathy likely secondary to CNS toxoplasmosis with HIV.   CT head done on 07/24/2019 showed stable small focus of acute subarachnoid hemorrhage over the left frontoparietal region.Resolving tiny focus of hemorrhage. No new areas of hemorrhage. Seen by infectious disease, following Seen by neurosurgery no plan for procedures at this time EEG completed on 07/23/2019 no seizure discharges Positive toxoplasmosis antibodies IgG Positive RPR screen and history of hepatitis C. Continue treatment per infectious disease  CNS toxoplasmosis and HIV Started on treatment on 07/24/2019 Continue atovaquone and sulfadiazine(not yet started) as recommended by infectious disease via NG tube Continue to hold off HIV medication while on treatment for toxoplasmosis  Elevated procalcitonin/sepsis suspected Procalcitonin 8.27 and transient tachycardia on 07/28/2019 Fever 100.8 on 07/27/19>> T max 99.7 on 07/28/19 We will obtain blood cultures peripherally x2 and start Zosyn and IV vancomycin empirically Obtain MRSA screening Repeat procalcitonin in the morning  Transient sinus tachycardia likely secondary to fever versus intravascular volume depletion T-max 99.7 today from 100.8 on 07/27/19 Stop IV fluid hydration due to pulmonary edema Did not tolerate beta-blocker and became severely hypotensive with map in the 50s   Pulmonary  edema suspect underlying CHF Chest  x-ray personally reviewed showed cardiomegaly and increase pulmonary vascularity with bilateral pleural effusion.  Obtain BNP and TTE Hold off IV fluid O2 sats stable at this time 95% RA  Hypotension possibly in the setting of sepsis Received 1 dose of p.o. Lopressor 12.5 mg once on 07/26/19 due to sinus tachycardia.  Map dropped in the 50s, improved after reversal with IV glucagon, IV fluids and IV albumin. Started on midodrine, continue 10 mg 3 times daily DC IV fluid due to pulmonary edema Start IV albumin 50 g 3 times daily x1 day Maintain map greater than 65 Start empiric IV abx IV vanc and zosyn as stated above  Acute blood loss suspect dilutional in the setting of IV fluid boluses and IV fluid hydration Hemoglobin 7.8(07/28/19) from 9.6 on 07/26/2019. No signs of overt bleeding  Alcohol abuse with concern for withdrawal Continue CIWA protocol Continue multivitamin, thiamine and folic acid Continue to monitor for signs of alcohol withdrawals  RPR screening/hep C screening reactive Defer management to infectious disease Will follow up with infectious disease outpatient  Dysphagia with poor oral intake status post NG tube placement on 07/25/2019 Evaluated by speech therapy on 07/24/2019 with recommendation for dysphagia 2 fine chop thin liquids Per bedside RN patient refused to take his medications NG tube placed at bedside on 07/25/2019 Continue aspiration precautions with head of bed elevated greater than 30 degree angle during feedings Dietitian consulted for feeding tube initiation and management Continue Free water flushes 200 cc every 6 hours  Severe protein calorie malnutrition  BMI low, not calculated.  Weight 54 kg.   Continue NG tube feeding Possible cortrack placement on 07/27/19 Dietitian following.  Nutrition recommendations.  [facility]atively managed on aspirin Plavix and statins. Continue p.o. aspirin,  Plavix, and statin   History of VF arrest. Continue to monitor on telemetry  Goals of care Palliative care team following Continue full scope of treatment Full code  Physical debility/ PT assessed and recommended SNF, 3-n-1  CSW consulted to assist with placement. Endocrine bedside commode     DVT prophylaxis:  Subcu Lovenox daily Code Status: Full code. Family Communication: Unable to reach family.  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  Will discharge when infectious disease signs off.  Consults called:  Infectious disease, neurosurgery, palliative care team.   Objective: Vitals:   07/28/19 0200 07/28/19 0300 07/28/19 0720 07/28/19 1204  BP: (!) 103/57 (!) 103/59 (!) 113/59 (!) 114/58  Pulse:  91 93 (!) 103  Resp:  20 20 20   Temp:   97.6 F (36.4 C) 99.7 F (37.6 C)  TempSrc:   Axillary Axillary  SpO2:  96% 98% 95%    Intake/Output Summary (Last 24 hours) at 07/28/2019 1434 Last data filed at 07/28/2019 0400 Gross per 24 hour  Intake 1826.25 ml  Output 1000 ml  Net 826.25 ml   There were no vitals filed for this visit.  Exam:   General: 68 y.o. year-old male frail-appearing no acute distress.  NG tube in place.  More alert and interactive today.  Cardiovascular: Regular rate and rhythm no rubs or gallops.  No JVD or thyromegaly noted.    Respiratory: Diffuse rales noted anteriorly.  No wheezing noted.  Poor inspiratory effort.    Abdomen: Nontender nondistended bowel sounds present.  Musculoskeletal: No lower extremity edema.    Psychiatry: Mood is appropriate for condition and setting.  Data Reviewed: CBC: Recent Labs  Lab 07/23/19 0101 07/23/19 1620 07/24/19 0314 07/25/19 0651 07/26/19 0802 07/28/19 1112  WBC 4.5  --  4.8 3.3* 7.8 6.0  NEUTROABS 2.0  --   --   --   --  4.0  HGB 9.5* 10.3* 9.0* 10.0* 9.6* 7.8*  HCT 28.0*  --  26.6* 29.9* 28.4* 23.6*  MCV 95.9  --  95.3 96.5 95.9 97.1  PLT 129*  --   143* 157 166 171   Basic Metabolic Panel: Recent Labs  Lab 07/23/19 0101 07/23/19 0337 07/24/19 0314 07/25/19 0651 07/26/19 0353 07/27/19 0603 07/28/19 1112  NA 133*  --  130* 134* 131*  --  136  K 3.4*  --  3.5 3.2* 4.0  --  4.0  CL 103  --  102 102 102  --  105  CO2 23  --  23 25 24   --  21*  GLUCOSE 117*  --  116* 90 115*  --  125*  BUN 6*  --  5* 5* 6*  --  16  CREATININE 0.73  --  0.71 0.73 0.64  --  0.58*  CALCIUM 8.3*  --  7.9* 8.7* 8.3*  --  8.4*  MG  --  1.4*  --   --  1.5* 2.0  --   PHOS  --   --   --   --  2.8  --   --    GFR: CrCl cannot be calculated (Unknown ideal weight.). Liver Function Tests: Recent Labs  Lab 07/23/19 0101  AST 33  ALT 25  ALKPHOS 55  BILITOT 0.6  PROT 8.4*  ALBUMIN 2.5*   No results for input(s): LIPASE, AMYLASE in the last 168 hours. No results for input(s): AMMONIA in the last 168 hours. Coagulation Profile: No results for input(s): INR, PROTIME in the last 168 hours. Cardiac Enzymes: No results for input(s): CKTOTAL, CKMB, CKMBINDEX, TROPONINI in the last 168 hours. BNP (last 3 results) No results for input(s): PROBNP in the last 8760 hours. HbA1C: Recent Labs    07/27/19 0603  HGBA1C 5.2   CBG: Recent Labs  Lab 07/27/19 1949 07/27/19 2327 07/28/19 0322 07/28/19 0722 07/28/19 1207  GLUCAP 118* 118* 111* 150* 140*   Lipid Profile: No results for input(s): CHOL, HDL, LDLCALC, TRIG, CHOLHDL, LDLDIRECT in the last 72 hours. Thyroid Function Tests: No results for input(s): TSH, T4TOTAL, FREET4, T3FREE, THYROIDAB in the last 72 hours. Anemia Panel: No results for input(s): VITAMINB12, FOLATE, FERRITIN, TIBC, IRON, RETICCTPCT in the last 72 hours. Urine analysis:    Component Value Date/Time   COLORURINE STRAW (A) 09/03/2016 2220   APPEARANCEUR CLEAR (A) 09/03/2016 2220   LABSPEC 1.002 (L) 09/03/2016 2220   PHURINE 8.0 09/03/2016 2220   GLUCOSEU NEGATIVE 09/03/2016 2220   HGBUR NEGATIVE 09/03/2016 2220    BILIRUBINUR NEGATIVE 09/03/2016 2220   KETONESUR NEGATIVE 09/03/2016 2220   PROTEINUR NEGATIVE 09/03/2016 2220   NITRITE NEGATIVE 09/03/2016 2220   LEUKOCYTESUR NEGATIVE 09/03/2016 2220   Sepsis Labs: @LABRCNTIP (procalcitonin:4,lacticidven:4)  ) Recent Results (from the past 240 hour(s))  SARS CORONAVIRUS 2 (TAT 6-24 HRS) Nasopharyngeal Nasopharyngeal Swab     Status: None   Collection Time: 07/23/19  5:35 AM   Specimen: Nasopharyngeal Swab  Result Value Ref Range Status   SARS Coronavirus 2 NEGATIVE NEGATIVE Final    Comment: (NOTE) SARS-CoV-2 target nucleic acids are NOT DETECTED. The SARS-CoV-2 RNA is generally detectable in upper and lower respiratory specimens during the  acute phase of infection. Negative results do not preclude SARS-CoV-2 infection, do not rule out co-infections with other pathogens, and should not be used as the sole basis for treatment or other patient management decisions. Negative results must be combined with clinical observations, patient history, and epidemiological information. The expected result is Negative. Fact Sheet for Patients: SugarRoll.be Fact Sheet for Healthcare Providers: https://www.woods-mathews.com/ This test is not yet approved or cleared by the [place] FDA and  has been authorized for detection and/or diagnosis of SARS-CoV-2 by FDA under an Emergency Use Authorization (EUA). This EUA will remain  in effect (meaning this test can be used) for the duration of the COVID-19 declaration under Section 56 4(b)(1) of the Act, 21 U.S.C. section 360bbb-3(b)(1), unless the authorization is terminated or revoked sooner. Performed at [facility], [place] [address] [place], [place] [address]       Studies: Dg Chest Port 1 View  Result Date: 07/28/2019 CLINICAL DATA:  Abnormal breath sounds. History of hypertension, COPD, asthma, HIV. EXAM: PORTABLE CHEST 1 VIEW COMPARISON:  CT  07/24/2019.  Chest x-ray 07/23/2019. FINDINGS: NG tube noted with tip below left hemidiaphragm. Cardiomegaly with bilateral pulmonary infiltrates/edema and bilateral pleural effusions. Findings most consistent with congestive heart failure. No pneumothorax. IMPRESSION: 1.  NG tube noted with tip below left hemidiaphragm. 2. Cardiomegaly with bilateral pulmonary infiltrates/edema bilateral pleural effusions. Findings most consistent with congestive heart failure. Electronically Signed   By: Marcello Moores  Register   On: 07/28/2019 12:52    Scheduled Meds:  aspirin EC  81 mg Oral Daily   atorvastatin  80 mg Per Tube Daily   atovaquone  1,500 mg Per Tube BID   clopidogrel  75 mg Per Tube Daily   enoxaparin (LOVENOX) injection  40 mg Subcutaneous Q24H   feeding supplement (ENSURE ENLIVE)  237 mL Per Tube TID BM   folic acid  1 mg Per Tube Daily   free water  200 mL Per Tube Q6H   guaiFENesin  15 mL Per Tube BID   influenza vaccine adjuvanted  0.5 mL Intramuscular Tomorrow-1000   insulin aspart  0-9 Units Subcutaneous Q4H   megestrol  400 mg Per Tube Daily   midodrine  10 mg Oral TID WC   mirtazapine  15 mg Per Tube QHS   multivitamin with minerals  1 tablet Per Tube Daily   QUEtiapine  25 mg Per Tube BID   sodium chloride HYPERTONIC  4 mL Nebulization BID   sulfaDIAZINE  1,000 mg Per Tube Q6H   thiamine  100 mg Per Tube Daily    Continuous Infusions:  albumin human     feeding supplement (OSMOLITE 1.2 CAL) 1,000 mL (07/27/19 2316)     LOS: 6 days     Kayleen Memos, MD [facility] Pager [phone]  If 7PM-7AM, please contact night-coverage [email or web address] Password [facility] 07/28/2019, 2:34 PM

## 2019-07-28 NOTE — Progress Notes (Signed)
Echocardiogram 2D Echocardiogram has been performed.  Oneal Deputy Drexel Ivey 07/28/2019, 3:49 PM

## 2019-07-28 NOTE — Progress Notes (Signed)
Vital signs continue to improve.  More alert and answering questions.  Did attempt a couple times to pull NG tube out when his hands were released.  Was reminded that he needed to keep it in so that he can get nutrition and get stronger.  He laughed and said "yeah right."  Had an uneventful night.

## 2019-07-29 LAB — CBC WITH DIFFERENTIAL/PLATELET
Abs Immature Granulocytes: 0.02 10*3/uL (ref 0.00–0.07)
Basophils Absolute: 0 10*3/uL (ref 0.0–0.1)
Basophils Relative: 0 %
Eosinophils Absolute: 0.1 10*3/uL (ref 0.0–0.5)
Eosinophils Relative: 2 %
HCT: 21 % — ABNORMAL LOW (ref 39.0–52.0)
Hemoglobin: 7 g/dL — ABNORMAL LOW (ref 13.0–17.0)
Immature Granulocytes: 0 %
Lymphocytes Relative: 16 %
Lymphs Abs: 0.8 10*3/uL (ref 0.7–4.0)
MCH: 31.4 pg (ref 26.0–34.0)
MCHC: 33.3 g/dL (ref 30.0–36.0)
MCV: 94.2 fL (ref 80.0–100.0)
Monocytes Absolute: 0.4 10*3/uL (ref 0.1–1.0)
Monocytes Relative: 9 %
Neutro Abs: 3.3 10*3/uL (ref 1.7–7.7)
Neutrophils Relative %: 73 %
Platelets: 174 10*3/uL (ref 150–400)
RBC: 2.23 MIL/uL — ABNORMAL LOW (ref 4.22–5.81)
RDW: 13.9 % (ref 11.5–15.5)
WBC: 4.6 10*3/uL (ref 4.0–10.5)
nRBC: 0 % (ref 0.0–0.2)

## 2019-07-29 LAB — BASIC METABOLIC PANEL
Anion gap: 7 (ref 5–15)
BUN: 14 mg/dL (ref 8–23)
CO2: 22 mmol/L (ref 22–32)
Calcium: 8.3 mg/dL — ABNORMAL LOW (ref 8.9–10.3)
Chloride: 107 mmol/L (ref 98–111)
Creatinine, Ser: 0.74 mg/dL (ref 0.61–1.24)
GFR calc Af Amer: >60 mL/min (ref >60)
GFR calc non Af Amer: >60 mL/min (ref >60)
Glucose, Bld: 119 mg/dL — ABNORMAL HIGH (ref 70–99)
Potassium: 4.2 mmol/L (ref 3.5–5.1)
Sodium: 136 mmol/L (ref 135–145)

## 2019-07-29 LAB — GLUCOSE, CAPILLARY
Glucose-Capillary: 129 mg/dL — ABNORMAL HIGH (ref 70–99)
Glucose-Capillary: 117 mg/dL — ABNORMAL HIGH (ref 70–99)
Glucose-Capillary: 104 mg/dL — ABNORMAL HIGH (ref 70–99)
Glucose-Capillary: 86 mg/dL (ref 70–99)
Glucose-Capillary: 91 mg/dL (ref 70–99)

## 2019-07-29 LAB — PROCALCITONIN: Procalcitonin: 6.3 ng/mL

## 2019-07-29 MED ORDER — METRONIDAZOLE IN NACL 5-0.79 MG/ML-% IV SOLN: 500.0000 mg | Freq: Three times a day (TID) | INTRAVENOUS | Status: DC

## 2019-07-29 MED ORDER — PIPERACILLIN-TAZOBACTAM 3.375 G IVPB
3.3750 g | Freq: Three times a day (TID) | INTRAVENOUS | Status: DC
  Filled 2019-07-29: qty 50

## 2019-07-29 MED ORDER — PIPERACILLIN-TAZOBACTAM 3.375 G IVPB 30 MIN: 3.3750 g | Freq: Three times a day (TID) | INTRAVENOUS | Status: DC

## 2019-07-29 MED ORDER — METRONIDAZOLE IN NACL 5-0.79 MG/ML-% IV SOLN
500.0000 mg | Freq: Three times a day (TID) | INTRAVENOUS | Status: AC
  Administered 2019-07-29 – 2019-08-02 (×13): 500 mg via INTRAVENOUS
  Filled 2019-07-29 (×13): qty 100

## 2019-07-29 MED ORDER — DAPSONE 100 MG PO TABS
100.0000 mg | ORAL_TABLET | Freq: Every day | ORAL | Status: DC
  Administered 2019-07-29: 100 mg via ORAL
  Filled 2019-07-29 (×2): qty 1

## 2019-07-29 MED ORDER — METRONIDAZOLE 500 MG PO TABS
500.0000 mg | ORAL_TABLET | Freq: Three times a day (TID) | ORAL | Status: DC
  Administered 2019-07-29: 500 mg via ORAL
  Filled 2019-07-29: qty 1

## 2019-07-29 NOTE — Progress Notes (Signed)
Hospitalist progress note  If 7PM-7AM,  night-coverage-look on AMION -prefer pages-not epic chat,please  Henry Oconnell  GBT:[id number] DOB: 1950/10/17 DOA: 07/22/2019 PCP: Patient, No Pcp Per  Narrative:  68 year old male COPD/asthma VF arrest 2018 hep C, history of IV drug use, HIV noncompliant with antiretrovirals for over 7 months, NSTEMI Rx [facility] you 07/05/19/2020-10/14 2020-also prior heart attack 5 to 6 months prior to the symptoms in hospital for 2 weeks had Zio patch at that time was treated with heparin loaded with aspirin and told to continue meds was not placed on other meds on discharge because of poor compliance borderline bradycardia Presented to [facility] ~ 10/25 for acute encephalopathy-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 Infectious disease as well neurosurgery were consulted given areas in the brain showing these lesions--his serologies show toxoplasmosis and he was started on definitive treatment for the same  Assessment & Plan: Toxic metabolic encephalopathy Felt to be secondary to toxoplasma encephalitis-continue atovaquone and sulfadiazine-pyrimethamine started 11/3 Needs MRI brain as per ID on 11/13 He is somewhat improved I am discontinuing his Ativan Seroquel and Remeron at this time as he is somewhat sedated Probable aspiration-NG tube placed 10/31 Procalcitonin 8-->6 lactic acid normalizing-had some tachycardia and has low-grade temps Nursing reporting thick secretions and coughing with inability to cough up sputum--I think you may be oversedated Keep n.p.o. stop feeds have speech see the patient repeat CXR in a.m. Poor candidate for NG tube Continue Zosyn that was started on 11/3 Palliative care to follow up with discussions with family HIV Holding Biktarvy at this time VL 90-deferring care to ID Prior concerns for alcoholism Discontinuing CIWA Untreated hepatitis C? Will need outpatient management if he survives this  admission RPR positive Defer to ID Dilutional anemia versus acute blood loss anemia Monitor trends-has dropped to a low of 7.0 Hemoccult stool and to notify MD if bleeding noted or further drop on a.m. labs Recent NSTEMI in the setting of another subacute 1 5 to 6 months previously Remote history of VF arrest 2018 Continue Plavix aspirin statin at this time Keep on monitors if allows  DVT prophylaxis: Lovenox  Code Status:   Full   Family Communication:   [place] and updated briefly about condition and Disposition Plan: inpatient  Consultants:   Neurosurgery  ID   Palliative Procedures:   Mri \  CT scan Antimicrobials:   Atavaquone  Pyrimethamine  Sulfadiazine  Vancomycin    Subjective: Awakens but confused Thinks that he is at a hotel Can orient and tell me his brother's name  Objective: Vitals:   07/28/19 2344 07/29/19 0335 07/29/19 0737 07/29/19 0809  BP: 108/62 115/61 122/79   Pulse: 92 84 92   Resp: 19 20 20    Temp: 97.7 F (36.5 C) 97.8 F (36.6 C) 99.3 F (37.4 C)   TempSrc: Oral  Oral   SpO2: 96% 97% 100% 100%    Intake/Output Summary (Last 24 hours) at 07/29/2019 1003 Last data filed at 07/29/2019 0343 Gross per 24 hour  Intake 5505.08 ml  Output 1700 ml  Net 3805.08 ml   There were no vitals filed for this visit.  Examination: Awake alert eomi cloudy R eye s1 s2 no m Chest somewhat clear Abd soft nt nd no rebound no gaurding Ashy skin to  LE's  raises LE's to command  Data Reviewed: I have personally reviewed following labs and imaging studies CBC: Recent Labs  Lab 07/23/19 0101  07/24/19 0314 07/25/19 7106  07/26/19 0802 07/28/19 1112 07/29/19 0300  WBC 4.5  --  4.8 3.3* 7.8 6.0 4.6  NEUTROABS 2.0  --   --   --   --  4.0 3.3  HGB 9.5*   < > 9.0* 10.0* 9.6* 7.8* 7.0*  HCT 28.0*  --  26.6* 29.9* 28.4* 23.6* 21.0*  MCV 95.9  --  95.3 96.5 95.9 97.1 94.2  PLT 129*  --  143* 157 166 171 174   < > = values in this  interval not displayed.   Basic Metabolic Panel: Recent Labs  Lab 07/23/19 0337 07/24/19 0314 07/25/19 0651 07/26/19 0353 07/27/19 0603 07/28/19 1112 07/29/19 0300  NA  --  130* 134* 131*  --  136 136  K  --  3.5 3.2* 4.0  --  4.0 4.2  CL  --  102 102 102  --  105 107  CO2  --  23 25 24   --  21* 22  GLUCOSE  --  116* 90 115*  --  125* 119*  BUN  --  5* 5* 6*  --  16 14  CREATININE  --  0.71 0.73 0.64  --  0.58* 0.74  CALCIUM  --  7.9* 8.7* 8.3*  --  8.4* 8.3*  MG 1.4*  --   --  1.5* 2.0  --   --   PHOS  --   --   --  2.8  --   --   --    GFR: CrCl cannot be calculated (Unknown ideal weight.). Liver Function Tests: Recent Labs  Lab 07/23/19 0101  AST 33  ALT 25  ALKPHOS 55  BILITOT 0.6  PROT 8.4*  ALBUMIN 2.5*   No results for input(s): LIPASE, AMYLASE in the last 168 hours. No results for input(s): AMMONIA in the last 168 hours. Coagulation Profile: No results for input(s): INR, PROTIME in the last 168 hours. Cardiac Enzymes: Radiology Studies: Reviewed images personally in health database  Scheduled Meds: . aspirin EC  81 mg Oral Daily  . atorvastatin  80 mg Per Tube Daily  . clopidogrel  75 mg Per Tube Daily  . enoxaparin (LOVENOX) injection  40 mg Subcutaneous Q24H  . feeding supplement (ENSURE ENLIVE)  237 mL Per Tube TID BM  . folic acid  1 mg Per Tube Daily  . free water  200 mL Per Tube Q6H  . guaiFENesin  15 mL Per Tube BID  . influenza vaccine adjuvanted  0.5 mL Intramuscular Tomorrow-1000  . insulin aspart  0-9 Units Subcutaneous Q4H  . megestrol  400 mg Per Tube Daily  . midodrine  10 mg Oral TID WC  . mirtazapine  15 mg Per Tube QHS  . multivitamin with minerals  1 tablet Per Tube Daily  . Pyrimethamine-Leucovorin  50 mg Oral Daily  . QUEtiapine  25 mg Per Tube BID  . sodium chloride HYPERTONIC  4 mL Nebulization BID  . sulfaDIAZINE  1,000 mg Per Tube Q6H  . thiamine  100 mg Per Tube Daily   Continuous Infusions: . feeding supplement  (OSMOLITE 1.2 CAL) 1,000 mL (07/27/19 2316)    LOS: 7 days   Time spent: 60 41, MD [facility]  07/29/2019, 10:03 AM

## 2019-07-29 NOTE — Progress Notes (Signed)
  Speech Language Pathology Treatment: Dysphagia  Patient Details Name: Henry Oconnell MRN: [id number] DOB: Oct 22, 1950 Today's Date: 07/29/2019 Time: 0258-5277 SLP Time Calculation (min) (ACUTE ONLY): 17 min  Assessment / Plan / Recommendation Clinical Impression  Bedside swallow evaluation order received and acknowledged.  Pt is already on ST caseload.  RN reported wet vocal quality beginning yesterday and she stated that she suctioned tan secretions from the pt's oral cavity this AM.  She stated that tube feedings had been stopped and that pt is currently NPO.  Pt was encountered asleep in bed with wet vocal quality and congested sounding respirations.  Pt roused to minimal verbal stimulation; however, he remained lethargic throughout this tx session.  Prior to po trials, SLP completed oral care with suction swab without difficulty.  Pt was observed to have moderate amounts of secretions in L buccal.  Suspect that pt may be aspirating his secretions.  Pt was cued to cough/clear throat; however, he was unable to cough up secretions or decrease wet vocal quality prior to po trials.  Pt was seen with minimal trials of thin liquid, honey-thick liquid, and puree.  Pt exhibited poor bolus acceptance, oral holding, and prolonged AP transport with all trials.  Pt initiated a swallow in 1/2 thin liquid via tsp trials given verbal encouragement.  No cough, throat clear, or changes in respiration were observed; however, wet vocal quality was present.  Unable to determine change from baseline vocal quality.  Pt exhibited oral holding with no attempt at lingual manipulation or swallow initiation with honey-thick liquid or puree trials.  Trials were suctioned from the pt's oral cavity secondary to aspiration concerns.  Pt may benefit from an instrumental swallow study to further evaluate swallow function; however, he is not appropriate today secondary to lethargy and inability to consistently follow commands for swallow  initiation.  Recommend NPO with frequent oral care.  SLP will continue to follow up to determine readiness for instrumental swallow study vs clinical diet initiation.     HPI HPI: [name], 68 y/m, transfered from [facility] for infectious disease input and possible advanced care. PMH: h/o HIV,  non-ST elevation MI, poly substance abuse, VF arrest and recent admission to [facility]. for acute encephalopathy. 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 No previous ST services.       SLP Plan  Goals updated       Recommendations  Diet recommendations: NPO Medication Administration: Via alternative means                Oral Care Recommendations: Oral care QID;Staff/trained caregiver to provide oral care Follow up Recommendations: Skilled Nursing facility;24 hour supervision/assistance SLP Visit Diagnosis: Dysphagia, unspecified (R13.10) Plan: Goals updated       Henry Oconnell M.S., [place] Office: [phone]                [place] 07/29/2019, 11:42 AM

## 2019-07-29 NOTE — Progress Notes (Signed)
After treatment patient was able to cough up a small amount of yellow,Henry Oconnell sputum. RT was able to suction it out using [place]. RT asked patient if he had any more he could cough up and patient state " No I'm alright" Vitals stable. RT will continue to monitor.

## 2019-07-29 NOTE — Progress Notes (Signed)
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  Not really interactive other than opens eyes when name is called.  CM was able to locate number for his brother, Hulen Skains, and I called and reached him today.  Hulen Skains and I discussed his clinical course and he shared with me family concern that "this may be his time."  We reviewed care plan and goal of seeing if he will improve while not promoting suffering if we are, in fact, approaching end of life.  He reports that his brother had not had specific conversations with family regarding his wishes at end of life, but Hulen Skains reports that he will talk with family and would like to be able to see his brother and discuss care plan in person.  BP 111/67 (BP Location: Left Arm)   Pulse 87   Temp 98.4 F (36.9 C) (Oral)   Resp 20   SpO2 95%   Labs and imaging noted Chart reviewed  Awake not alert No distress Has foley Regular S1 S2 Appears thin and weak No edema  68 yo gentleman with history of HIV, history of recent NSTEMI, history of V fib arrest, poly substance use, admitted with altered mental status, has been transferred from [facility].   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  Continue current mode of care.  I was able to talk to his brother, Hulen Skains, today via phone.  We discussed his clinical course and Hulen Skains reports that family is concerned that he is not going to improve and be able to maintain his own nutrition and hydration.  We discussed aggressive interventions, such as CPR/mechanical ventilation, at the end of life and he states that he does not think that this would be his brother's wish.  We also discussed continuation of current care with short term reassessment.  Hulen Skains would like to come to the hospital tomorrow with his sister for a meeting to establish [place].   Total time: 45 minutes  Greater than 50%  of this time was spent counseling and coordinating care  related to the above assessment and plan.  Micheline Rough, MD [place] Team [phone]

## 2019-07-29 NOTE — Progress Notes (Signed)
[facility] for Infectious Disease  Date of Admission:  07/22/2019      Total days of antibiotics   Day 4 atovaquone  Day 2 sulfadiazine   Day 1 metronidazole            ASSESSMENT: Henry Oconnell is a 68 y.o. male with HIV, +AIDS (VL 90, CD4 189) here with encephalopathy in the setting of multiple brain abscesses. Peripheral Toxo IgG positive.  Toxoplasma Encephalitis -  He is quite sedated today compared to yesterday. Noted adjustments in seroquel + mirtazapine from home regimen and hospital CIWA protocol. He will still need to continue treatment with Pyramethamine-leucovorin + sulfadiazine enterally. Plan to repeat scan at the end of next week to follow treatment response.   Hep C + RNA 1 million - will address outpatient for treatment after current acute needs are met.   +RPR - serofast compared to previous titers. No treatment indicated   ?sulfa drug rash - no symptoms suggesting rash with sulfadiazine so far. Continue and monitor.   Rhonchi - suspect aspiration given enteral feeds (had some residue on mouth that appears c/w TF infusion)-->on hold now. He still needs to get PO meds via NGT to treat his toxoplasmosis however. Uncertain if a temporary G-tube would be better in this setting. MRSA PCR negative so will stop vanc. Change zosyn to metronidazole to cover anaerobes with aspiration pneumonia and follow clinically.   PLAN: 1. Continue Pyrimethamine-leucovorin V.T. 2. Continue Sulfadiazine VT  3. Restart dapsone for PJP prophylaxis 4. Hold Biktarvy  5. Follow for signs of rash or intolerance to Sulfadiazine 6. Change zosyn to metronidazole IV    Principal Problem:   Toxoplasma meningoencephalitis (HCC) Active Problems:   Human immunodeficiency virus (HIV) disease ([place])   Hepatitis C   Acute encephalopathy   Brain mass   Coronary artery disease involving native coronary artery of native heart without angina pectoris   Palliative care by specialist    Goals of care, counseling/discussion   . aspirin EC  81 mg Oral Daily  . clopidogrel  75 mg Per Tube Daily  . enoxaparin (LOVENOX) injection  40 mg Subcutaneous Q24H  . folic acid  1 mg Per Tube Daily  . guaiFENesin  15 mL Per Tube BID  . influenza vaccine adjuvanted  0.5 mL Intramuscular Tomorrow-1000  . insulin aspart  0-9 Units Subcutaneous Q4H  . metroNIDAZOLE  500 mg Oral Q8H  . multivitamin with minerals  1 tablet Per Tube Daily  . Pyrimethamine-Leucovorin  50 mg Oral Daily  . sodium chloride HYPERTONIC  4 mL Nebulization BID  . sulfaDIAZINE  1,000 mg Per Tube Q6H  . thiamine  100 mg Per Tube Daily    SUBJECTIVE: Non-verbal today. Hard to arouse.    Interval additions -  Low grade temps ~100.    Review of Systems: Review of Systems  Unable to perform ROS: Patient nonverbal    Allergies  Allergen Reactions  . Sulfamethoxazole Micro [Sulfamethoxazole] Rash  . Trimethoprim Rash    OBJECTIVE: Vitals:   07/28/19 2344 07/29/19 0335 07/29/19 0737 07/29/19 0809  BP: 108/62 115/61 122/79   Pulse: 92 84 92   Resp: _0 Temp: 97.7 F (36.5 C) 97.8 F (36.6 C) 99.3 F (37.4 C)   TempSrc: Oral  Oral   SpO2: 96% 97% 100% 100%   There is no height or weight on file to calculate BMI.  Physical Exam Vitals signs  and nursing note reviewed.  Constitutional:      Appearance: He is ill-appearing (chronically so).     Comments: Difficult to arouse. Non-verbal today.   HENT:     Mouth/Throat:     Mouth: Mucous membranes are dry.     Pharynx: No oropharyngeal exudate.  Eyes:     General: No scleral icterus.    Pupils: Pupils are equal, round, and reactive to light.  Cardiovascular:     Rate and Rhythm: Normal rate and regular rhythm.     Heart sounds: No murmur.  Pulmonary:     Effort: Pulmonary effort is normal.     Breath sounds: Rhonchi present.  Abdominal:     General: There is no distension.     Tenderness: There is no abdominal tenderness.   Musculoskeletal: Normal range of motion.  Skin:    General: Skin is warm and dry.     Capillary Refill: Capillary refill takes less than 2 seconds.  Neurological:     Comments: Unable to meaningfully assess.      Lab Results Lab Results  Component Value Date   WBC 4.6 07/29/2019   HGB 7.0 (L) 07/29/2019   HCT 21.0 (L) 07/29/2019   MCV 94.2 07/29/2019   PLT 174 07/29/2019    Lab Results  Component Value Date   CREATININE 0.74 07/29/2019   BUN 14 07/29/2019   NA 136 07/29/2019   K 4.2 07/29/2019   CL 107 07/29/2019   CO2 22 07/29/2019    Lab Results  Component Value Date   ALT 25 07/23/2019   AST 33 07/23/2019   ALKPHOS 55 07/23/2019   BILITOT 0.6 07/23/2019     Microbiology: Recent Results (from the past 240 hour(s))  SARS CORONAVIRUS 2 (TAT 6-24 HRS) Nasopharyngeal Nasopharyngeal Swab     Status: None   Collection Time: 07/23/19  5:35 AM   Specimen: Nasopharyngeal Swab  Result Value Ref Range Status   SARS Coronavirus 2 NEGATIVE NEGATIVE Final    Comment: (NOTE) SARS-CoV-2 target nucleic acids are NOT DETECTED. The SARS-CoV-2 RNA is generally detectable in upper and lower respiratory specimens during the acute phase of infection. Negative results do not preclude SARS-CoV-2 infection, do not rule out co-infections with other pathogens, and should not be used as the sole basis for treatment or other patient management decisions. Negative results must be combined with clinical observations, patient history, and epidemiological information. The expected result is Negative. Fact Sheet for Patients: SugarRoll.be Fact Sheet for Healthcare Providers: https://www.woods-mathews.com/ This test is not yet approved or cleared by the [place] FDA and  has been authorized for detection and/or diagnosis of SARS-CoV-2 by FDA under an Emergency Use Authorization (EUA). This EUA will remain  in effect (meaning this test can be  used) for the duration of the COVID-19 declaration under Section 56 4(b)(1) of the Act, 21 U.S.C. section 360bbb-3(b)(1), unless the authorization is terminated or revoked sooner. Performed at [facility], [place] [address], [place], [place] [address]   MRSA PCR Screening     Status: None   Collection Time: 07/28/19  4:22 PM   Specimen: Nasal Mucosa; Nasopharyngeal  Result Value Ref Range Status   MRSA by PCR NEGATIVE NEGATIVE Final    Comment:        The GeneXpert MRSA Assay (FDA approved for NASAL specimens only), is one component of a comprehensive MRSA colonization surveillance program. It is not intended to diagnose MRSA infection nor to guide or monitor treatment  for MRSA infections. Performed at [facility], [place] [address], [place], [place] [address]     Janene Madeira, MSN, NP-C [facility] for Infectious Disease [email or web address] Pager: [phone] Office: [phone] [place]: [phone]

## 2019-07-30 ENCOUNTER — Inpatient Hospital Stay (HOSPITAL_COMMUNITY): Payer: Medicare Other

## 2019-07-30 LAB — CBC
HCT: 22.7 % — ABNORMAL LOW (ref 39.0–52.0)
Hemoglobin: 7.6 g/dL — ABNORMAL LOW (ref 13.0–17.0)
MCH: 31.9 pg (ref 26.0–34.0)
MCHC: 33.5 g/dL (ref 30.0–36.0)
MCV: 95.4 fL (ref 80.0–100.0)
Platelets: 194 10*3/uL (ref 150–400)
RBC: 2.38 MIL/uL — ABNORMAL LOW (ref 4.22–5.81)
RDW: 15 % (ref 11.5–15.5)
WBC: 4.8 10*3/uL (ref 4.0–10.5)
nRBC: 0 % (ref 0.0–0.2)

## 2019-07-30 LAB — GLUCOSE, CAPILLARY
Glucose-Capillary: 78 mg/dL (ref 70–99)
Glucose-Capillary: 80 mg/dL (ref 70–99)
Glucose-Capillary: 82 mg/dL (ref 70–99)
Glucose-Capillary: 85 mg/dL (ref 70–99)
Glucose-Capillary: 87 mg/dL (ref 70–99)
Glucose-Capillary: 93 mg/dL (ref 70–99)

## 2019-07-30 LAB — PROTIME-INR
INR: 1.1 (ref 0.8–1.2)
Prothrombin Time: 13.7 seconds (ref 11.4–15.2)

## 2019-07-30 LAB — COMPREHENSIVE METABOLIC PANEL
ALT: 20 U/L (ref 0–44)
AST: 36 U/L (ref 15–41)
Albumin: 3.2 g/dL — ABNORMAL LOW (ref 3.5–5.0)
Alkaline Phosphatase: 73 U/L (ref 38–126)
Anion gap: 9 (ref 5–15)
BUN: 13 mg/dL (ref 8–23)
CO2: 20 mmol/L — ABNORMAL LOW (ref 22–32)
Calcium: 8.9 mg/dL (ref 8.9–10.3)
Chloride: 106 mmol/L (ref 98–111)
Creatinine, Ser: 0.87 mg/dL (ref 0.61–1.24)
GFR calc Af Amer: >60 mL/min (ref >60)
GFR calc non Af Amer: >60 mL/min (ref >60)
Glucose, Bld: 90 mg/dL (ref 70–99)
Potassium: 4.3 mmol/L (ref 3.5–5.1)
Sodium: 135 mmol/L (ref 135–145)
Total Bilirubin: 0.3 mg/dL (ref 0.3–1.2)
Total Protein: 7 g/dL (ref 6.5–8.1)

## 2019-07-30 MED ORDER — DAPSONE 100 MG PO TABS
100.0000 mg | ORAL_TABLET | Freq: Every day | ORAL | Status: DC
  Administered 2019-07-30 – 2019-08-01 (×3): 100 mg
  Filled 2019-07-30 (×4): qty 1

## 2019-07-30 MED ORDER — PYRIMETHAMINE-LEUCOVORIN 50-25 MG PO CAPS
50.0000 mg | ORAL_CAPSULE | Freq: Every day | ORAL | Status: DC
  Administered 2019-07-30: 50 mg
  Filled 2019-07-30 (×3): qty 1

## 2019-07-30 MED ORDER — ASPIRIN 81 MG PO CHEW
81.0000 mg | CHEWABLE_TABLET | Freq: Every day | ORAL | Status: DC
  Administered 2019-07-30 – 2019-08-01 (×3): 81 mg
  Filled 2019-07-30 (×4): qty 1

## 2019-07-30 NOTE — Progress Notes (Signed)
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 today.  He was awake and interactive but still without insight into his condition and too confused to participate in goals of care discussion.  I met today with his brother, Henry Oconnell, and his sister, Henry Oconnell, at the bedside.  I introduced palliative care as specialized medical care for people living with serious illness. It focuses on providing relief from the symptoms and stress of a serious illness. The goal is to improve quality of life for both the patient and the family.  Henry Oconnell has been living with his brother who has helped him with day-to-day activities as well as attempting to help him with his medical care.  Henry Oconnell reports that Henry Oconnell quit going to his doctor's appointments and taking his medication around 9 months ago despite him encouraging him to do so.  He also had increase in substance use at that time and his sister believes this may have been attributable to increase in depression.  We discussed his support system.  He does have 2 daughters, but they also have issues with substance abuse and " one of them is his Henry Oconnell) dealer."  They have not seen his daughters nor has he spoken to them in months (possibly years).  They do not have any contact information and report that his daughters have not been involved in his care.  His brother states that he is Henry Oconnell's surrogate decision maker and he believes that they completed paperwork regarding this in the past.  He will look to see if this paperwork is available.  We discussed his clinical course this admission and continued concern regarding his encephalopathy, aspiration, and poor nutrition.  Both Henry Oconnell and Henry Oconnell report that they they are hopeful that he will have some stabilization and recovery, but they also understand that there is high likelihood he will continue to decompensate.  All of his siblings (5 total) are in  agreement that his desire if he understood his situation would be to forego heroic interventions in the event of a natural death.  We discussed plan to continue with current interventions (he did have recent antibiotic change) for the next several days to see if he has any further recovery in his mental status and subsequent ability to maintain his own nutrition and hydration.  Family is clear that they would not want to pursue a PEG tube.  If he reaches a point where he is going to be able to potentially safely maintain his nutrition and hydration, they would like to continue with current interventions.  If, however, it becomes apparent that he is going to continue to aspirate regardless of interventions moving forward, Henry Oconnell reports that his family will likely want to "not prolong things and ensure he does not suffer."  BP (!) 141/70 (BP Location: Left Arm)   Pulse 78   Temp 98 F (36.7 C) (Oral)   Resp 20   SpO2 99%   Labs and imaging noted Chart reviewed  General: Alert, answers some questions but confused and without insight into his condition. Wet vocal quality Heart: Regular rate and rhythm. No murmur appreciated. Lungs: Fair air movement, scattered coarse Abdomen: Soft, nontender, nondistended, positive bowel sounds.  Ext: No significant edema Skin: Warm and dry  A/P: 68 yo gentleman with history of HIV, history of recent NSTEMI, history of V fib arrest, poly substance use, admitted with altered mental status, has been transferred from [facility].  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   - I met today with patient's brother Henry Oconnell) and sister Henry Oconnell).  Henry Oconnell reports that he is his brother Ambulance person and believes that his brother had completed paperwork at one point to this effect.  This paperwork is not available to me today.  While [name] also has 2 daughters, they have not been involved in his life and no contact information is available.   His brother and sister are certainly acting in his best interest and are well versed in what [name] finds to be most important regarding his care.  As his daughters are not readily available (has been in the hospital for 1 week with no contact and family report that they have not been involved for months to years ) to participate in goals conversation and it appears that he had named his brother as surrogate in the past, will continue to rely on him to help guide decision-making at this point.  - I discussed with family regarding heroic interventions at the end-of-life and they agree this would not be in line with prior expressed wishes for a natural death or be likely to lead to getting well enough to go back home. They were in agreement with changing CODE STATUS to DO NOT RESUSCITATE. - Otherwise, continue current mode of care with plan to reassess his situation early next week.  They understand that he is continuing to aspirate and would not be in favor of placement of the PEG tube.  Total time: 55 minutes  Greater than 50%  of this time was spent counseling and coordinating care related to the above assessment and plan.  Micheline Rough, MD [place] Team [phone]

## 2019-07-30 NOTE — Progress Notes (Signed)
Physical Therapy Treatment Patient Details Name: Henry Oconnell MRN: [id number] DOB: 1951-02-07 Today's Date: 07/30/2019    History of Present Illness Henry Oconnell is a 68 y.o. male with history of HIV, recently admitted at [facility] for non-ST elevation MI managed conservatively, history of polysubstance abuse, history of VF arrest was recently admitted at [facility] 3 days ago for acute encephalopathy. MRI brain showed multiple masslike areas with a predilection for gray-white matter junction and the ventricles. 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 started on treatment by infectious disease.      PT Comments    Pt was mod assist overall to come to sitting EOB and stand EOB with left posterior preference in sitting and standing.  I would like to try standing EOB with RW next session and OOB to chair if pt agreeable (too fatigued today).  I believe we could correct for the lean if he has a RW to hold to in standing.  PT will continue to follow acutely for safe mobility progression.   Follow Up Recommendations  SNF;Supervision/Assistance - 24 hour     Equipment Recommendations  Hospital bed;Wheelchair (measurements PT);Wheelchair cushion (measurements PT);3in1 (PT)    Recommendations for Other Services   NA     Precautions / Restrictions Precautions Precautions: Fall;Other (comment) Precaution Comments: aspiration (difficulty with salavia and phlem), NGT    Mobility  Bed Mobility Overal bed mobility: Needs Assistance Bed Mobility: Supine to Sit;Sit to Supine     Supine to sit: Mod assist;HOB elevated Sit to supine: Mod assist;HOB elevated   General bed mobility comments: Mod assist to come to EOB, he is assisting in progressing legs to the side with therapist's initiation, and when given a hand and the bed rail, helps pull his trunk up to sitting.   Transfers Overall transfer level: Needs assistance Equipment used: None Transfers: Sit to/from Stand Sit  to Stand: Mod assist         General transfer comment: Light mod assist to come to standing with left posterior thrust.       Balance Overall balance assessment: Needs assistance Sitting-balance support: Feet supported;Bilateral upper extremity supported Sitting balance-Leahy Scale: Poor Sitting balance - Comments: min assist as he fatigued EOB due to left posterior lean Postural control: Posterior lean;Left lateral lean Standing balance support: Bilateral upper extremity supported Standing balance-Leahy Scale: Poor Standing balance comment: light mod assist in standing due to left posterior initial thrust.  Once standing could shift weight and get a bit more balanced.                              Cognition Arousal/Alertness: Awake/alert Behavior During Therapy: Flat affect Overall Cognitive Status: Impaired/Different from baseline Area of Impairment: Orientation;Attention;Memory;Following commands;Awareness;Safety/judgement;Problem solving                 Orientation Level: Disoriented to;Time;Situation Current Attention Level: Sustained Memory: Decreased recall of precautions;Decreased short-term memory Following Commands: Follows one step commands consistently;Follows one step commands with increased time Safety/Judgement: Decreased awareness of safety;Decreased awareness of deficits Awareness: Intellectual Problem Solving: Slow processing;Decreased initiation;Difficulty sequencing;Requires verbal cues;Requires tactile cues General Comments: Pt slow to process, can tell me his name and hospital.  He is slow to move and slow to follow commands, has R gaze and seems to be seeing double.        Exercises General Exercises - Upper Extremity Shoulder Flexion: AAROM;Both;10 reps Elbow Flexion: AAROM;Both;10 reps General  Exercises - Lower Extremity Ankle Circles/Pumps: PROM;Both;10 reps Heel Slides: AAROM;Both;10 reps Hip ABduction/ADduction: PROM;Both;10  reps    General Comments General comments (skin integrity, edema, etc.): RN endotrachial suction, oral care and oral suction throughout session as pt had difficulty with phlem and his salavia management.        Pertinent Vitals/Pain Pain Assessment: No/denies pain           PT Goals (current goals can now be found in the care plan section) Acute Rehab PT Goals Patient Stated Goal: pt did not state Progress towards PT goals: Progressing toward goals    Frequency    Min 2X/week      PT Plan Current plan remains appropriate       AM-PAC PT "6 Clicks" Mobility   Outcome Measure  Help needed turning from your back to your side while in a flat bed without using bedrails?: A Lot Help needed moving from lying on your back to sitting on the side of a flat bed without using bedrails?: A Lot Help needed moving to and from a bed to a chair (including a wheelchair)?: A Lot Help needed standing up from a chair using your arms (e.g., wheelchair or bedside chair)?: A Lot Help needed to walk in hospital room?: Total Help needed climbing 3-5 steps with a railing? : Total 6 Click Score: 10    End of Session   Activity Tolerance: Patient limited by fatigue Patient left: in bed;with call bell/phone within reach;with bed alarm set Nurse Communication: Mobility status PT Visit Diagnosis: Unsteadiness on feet (R26.81);Muscle weakness (generalized) (M62.81);Difficulty in walking, not elsewhere classified (R26.2)     Time: 1610-9604 PT Time Calculation (min) (ACUTE ONLY): 26 min  Charges:  $Therapeutic Exercise: 8-22 mins $Therapeutic Activity: 8-22 mins                    Corinna Capra, PT, DPT  Acute Rehabilitation [phone] pager #[phone] office  @ [place]: [phone]   07/30/2019, 2:19 PM

## 2019-07-30 NOTE — Progress Notes (Signed)
  Speech Language Pathology Treatment: Dysphagia  Patient Details Name: Henry Oconnell MRN: [id number] DOB: Feb 10, 1951 Today's Date: 07/30/2019 Time: 7793-9030 SLP Time Calculation (min) (ACUTE ONLY): 14 min  Assessment / Plan / Recommendation Clinical Impression  Pt with similar presentation as yesterday and he remained lethargic throughout this session.  Observed congested respirations and mildly wet vocal quality upon arrival.  Pt was cued to cough; however, his cough was not strong enough to completely clear wet vocal quality or bring secretions to his oral cavity to be suctioned.  SLP completed oral care with suction swab prior to po trials.  Pt was observed with trials of ice chips and he exhibited poor bolus acceptance, decreased lingual manipulation, and oral holding with significantly delayed swallow initiation that required max verbal and tactile cues to initiate.  Pt exhibited an immediate throat clear following ice chip trials and increased wet vocal quality.  Oral suction was provided following trials and it revealed moderate amounts of either secretions or the remaining liquid bolus from ice chips trials in the R and L buccal cavities.  Continue to suspect that pt is aspirating his secretions.  Pt refused additional trials.  He is not appropriate for a MBS today secondary to limited participation and lethargy.  Recommend continuation of NPO with frequent oral care and alternative means of nutrition.  SLP will continue to follow to determine readiness for an instrumental swallow study vs clinical diet initiation.     HPI HPI: Mr JOELL USMAN, 68 y/m, transfered from [facility] [place] for infectious disease input and possible advanced care. PMH: h/o HIV,  non-ST elevation MI, poly substance abuse, VF arrest and recent admission to [facility]. for acute encephalopathy. 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 No previous ST services.       SLP Plan  Continue with current plan of care       Recommendations  Diet recommendations: NPO Medication Administration: Via alternative means                Oral Care Recommendations: Oral care QID;Staff/trained caregiver to provide oral care Follow up Recommendations: Skilled Nursing facility;24 hour supervision/assistance SLP Visit Diagnosis: Dysphagia, unspecified (R13.10) Plan: Continue with current plan of care       Colin Mulders M.S., [place] Office: [phone]                [place] 07/30/2019, 9:06 AM

## 2019-07-30 NOTE — Progress Notes (Signed)
Patient unable to cough and clear secretions at this time. RT NTS'd patient at this time due to audible rhonchi. Moderate amount of tan thick secretions with small amount of blood from irritation of catheter passing through left nare. Vitals stable throughout.

## 2019-07-30 NOTE — Progress Notes (Signed)
Nutrition Follow-up  DOCUMENTATION CODES:   Not applicable  INTERVENTION:  RD to follow for [place] Resume tube feedings as appropriate ([facility] team available Tuesdays and Fridays to place tubes if needed)   -Osmolite 1.2 @ 75 ml/hr (1800 ml/day) provides 2160 kcal, 100 grams of protein, and 1476 ml H20 Free water flushes per MD  Recommend obtaining current weight to fully assess patient's needs  NUTRITION DIAGNOSIS:   Increased nutrient needs related to chronic illness(HIV) as evidenced by estimated needs; ongoing  GOAL:   Patient will meet greater than or equal to 90% of their needs; progressing   MONITOR:   PO intake, Supplement acceptance, Skin, Weight trends, Labs, I & O's  REASON FOR ASSESSMENT:   Consult Enteral/tube feeding initiation and management  ASSESSMENT:  RD working remotely.  68 yo gentleman with history of HIV, history of recent NSTEMI, history of V fib arrest, poly substance use, admitted with altered mental status, has been transferred from [facility]. 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 Per SLP, patient not appropriate for MBS today secondary to limited participation and lethargy.  Per chart review, patient's brother and sister to meet with PCT today to establish goals of care. MD noted patient may require alternative means of feeding as NG is suspected to be causing aspiration.Tube feedings currently remain on hold. RD will follow to monitor for plan of care. Medications and labs reviewed.   Diet Order:   Diet Order            Diet NPO time specified  Diet effective now              EDUCATION NEEDS:    Not appropriate for education at this time  Skin:  Skin Assessment: Reviewed RN Assessment  Last BM:  11/2  Height:   Ht Readings from Last 1 Encounters:  No data found for Ht    Weight:   Wt Readings from Last 1 Encounters:  No data found for Wt    Ideal Body Weight:     BMI:  There is no height or weight on file to calculate BMI.  Estimated Nutritional Needs:   Kcal:  2000-2200  Protein:  100-110 grams  Fluid:  >/= 2 L/day  Lajuan Lines, RD, LDN Clinical Nutrition [place] Telephone [phone] After Hours/Weekend Pager: [phone]

## 2019-07-30 NOTE — Progress Notes (Signed)
Hospitalist progress note  If 7PM-7AM,  night-coverage-look on AMION -prefer pages-not epic chat,please  Henry Oconnell  AOZ:[id number] DOB: Mar 29, 1951 DOA: 07/22/2019 PCP: Patient, No Pcp Per  Narrative:  68 year old male COPD/asthma VF arrest 2018 hep C, history of IV drug use, HIV noncompliant with antiretrovirals for over 7 months, NSTEMI Rx WF you 07/05/19/2020-10/14 2020-also prior heart attack 5 to 6 months prior to the symptoms in hospital for 2 weeks had Zio patch at that time was treated with heparin loaded with aspirin and told to continue meds was not placed on other meds on discharge because of poor compliance borderline bradycardia Presented to [facility] ~ 10/25 for acute encephalopathy-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 but also aspiration continue atovaquone and sulfadiazine-pyrimethamine started 11/3 Treat pneumonia with Flagyl Needs MRI brain as per ID on 11/13 Probable aspiration-NG tube placed 10/31 White count normal, chest x-ray my over read 11/5, 11/3 films show probable aspiration Appreciate palliative care follow-up and expect further goals of care going forward to determine planning--- he has an NG tube in but I feel this is what is causing aspiration and he may require alternative means of feeding if family thinks he has a chance at survival-palliative care input is essential given his chronic comorbidities he is at very high risk for  decline HIV Holding Biktarvy at this time VL 90-deferring care to ID Prior concerns for alcoholism Discontinuing CIWA Untreated hepatitis C? Will need outpatient management if he survives this admission RPR positive Defer to ID Dilutional anemia versus acute blood loss anemia Monitor trends-has dropped to a low of 7.0 and is currently up to 7.6 Recent NSTEMI in the setting of another subacute 1 5 to 6 months previously Remote history of VF arrest 2018 Continue Plavix aspirin statin at this time Keep on monitors if allows  DVT prophylaxis: Lovenox  Code Status:   Full   Family Communication:   Lucien Mons on 11/4 and updated Disposition Plan: inpatient  Consultants:   Neurosurgery  ID   Palliative Procedures:   Mri \  CT scan Antimicrobials:   Atavaquone  Pyrimethamine  Sulfadiazine  Vancomycin    Subjective: Awakens and is somewhat confused about year and month but can tell me he is in the hospital recites that he has 2 children and a brother seems to fall asleep but then awakens right back up When I try to address goals of care he seems to defer to his daughters  Objective: Vitals:   07/29/19 2314 07/30/19 0403 07/30/19 0731 07/30/19 0831  BP: 134/79 (!) 141/78  140/89  Pulse: 92 84  84  Resp: 18 18  20   Temp: 98 F (36.7 C) 98.6 F (37 C)  98.3 F (36.8 C)  TempSrc: Axillary Axillary  Oral  SpO2: 97% 94% 96% 95%    Intake/Output Summary (Last 24 hours) at 07/30/2019 0958 Last data filed at 07/30/2019 0831 Gross per 24 hour  Intake 300 ml  Output 3850 ml  Net -3550 ml   There were no vitals filed for this visit.  Examination: Awake coherent ill-appearing male no distress EOMI NCAT no focal deficit Chest with significant rales and rhonchi and increased breath sounds Abdomen soft nontender NG tube in left nare Ashy lower extremities   Data Reviewed: I have personally reviewed following labs and imaging studies CBC: Recent Labs   Lab 07/25/19 0651 07/26/19 0802 07/28/19 1112 07/29/19 0300 07/30/19 0813  WBC 3.3* 7.8 6.0 4.6 4.8  NEUTROABS  --   --  4.0 3.3  --   HGB 10.0* 9.6* 7.8* 7.0* 7.6*  HCT 29.9* 28.4* 23.6* 21.0* 22.7*  MCV 96.5 95.9 97.1 94.2 95.4  PLT 157 166 171 174 341   Basic Metabolic Panel: Recent Labs  Lab 07/25/19 0651 07/26/19 0353 07/27/19 0603 07/28/19 1112 07/29/19 0300 07/30/19 0315  NA 134* 131*  --  136 136 135  K 3.2* 4.0  --  4.0 4.2 4.3  CL 102 102  --  105 107 106  CO2 25 24  --  21* 22 20*  GLUCOSE 90 115*  --  125* 119* 90  BUN 5* 6*  --  16 14 13   CREATININE 0.73 0.64  --  0.58* 0.74 0.87  CALCIUM 8.7* 8.3*  --  8.4* 8.3* 8.9  MG  --  1.5* 2.0  --   --   --   PHOS  --  2.8  --   --   --   --    GFR: CrCl cannot be calculated (Unknown ideal weight.). Liver Function Tests: Recent Labs  Lab 07/30/19 0315  AST 36  ALT 20  ALKPHOS 73  BILITOT 0.3  PROT 7.0  ALBUMIN 3.2*   No results for input(s): LIPASE, AMYLASE in the last 168 hours. No results for input(s): AMMONIA in the last 168 hours. Coagulation Profile: Recent Labs  Lab 07/30/19 0315  INR 1.1   Cardiac Enzymes: Radiology Studies: Reviewed images personally in health database  Scheduled Meds: . aspirin  81 mg Per Tube Daily  . clopidogrel  75 mg Per Tube Daily  . dapsone  100 mg Per Tube Daily  . enoxaparin (LOVENOX) injection  40 mg Subcutaneous Q24H  . folic acid  1 mg Per Tube Daily  . guaiFENesin  15 mL Per Tube BID  . influenza vaccine adjuvanted  0.5 mL Intramuscular Tomorrow-1000  . insulin aspart  0-9 Units Subcutaneous Q4H  . multivitamin with minerals  1 tablet Per Tube Daily  . Pyrimethamine-Leucovorin  50 mg Per Tube Daily  . sodium chloride HYPERTONIC  4 mL Nebulization BID  . sulfaDIAZINE  1,000 mg Per Tube Q6H  . thiamine  100 mg Per Tube Daily   Continuous Infusions: . metronidazole 500 mg (07/30/19 0541)    LOS: 8 days   Time spent: [place], MD [facility]  Hospitalist  07/30/2019, 9:58 AM

## 2019-07-31 LAB — BASIC METABOLIC PANEL
Anion gap: 10 (ref 5–15)
BUN: 14 mg/dL (ref 8–23)
CO2: 20 mmol/L — ABNORMAL LOW (ref 22–32)
Calcium: 9 mg/dL (ref 8.9–10.3)
Chloride: 105 mmol/L (ref 98–111)
Creatinine, Ser: 0.86 mg/dL (ref 0.61–1.24)
GFR calc Af Amer: >60 mL/min (ref >60)
GFR calc non Af Amer: >60 mL/min (ref >60)
Glucose, Bld: 81 mg/dL (ref 70–99)
Potassium: 3.8 mmol/L (ref 3.5–5.1)
Sodium: 135 mmol/L (ref 135–145)

## 2019-07-31 LAB — CBC WITH DIFFERENTIAL/PLATELET
Abs Immature Granulocytes: 0.03 10*3/uL (ref 0.00–0.07)
Basophils Absolute: 0 10*3/uL (ref 0.0–0.1)
Basophils Relative: 1 %
Eosinophils Absolute: 0.1 10*3/uL (ref 0.0–0.5)
Eosinophils Relative: 2 %
HCT: 24.1 % — ABNORMAL LOW (ref 39.0–52.0)
Hemoglobin: 8 g/dL — ABNORMAL LOW (ref 13.0–17.0)
Immature Granulocytes: 1 %
Lymphocytes Relative: 36 %
Lymphs Abs: 2 10*3/uL (ref 0.7–4.0)
MCH: 32.3 pg (ref 26.0–34.0)
MCHC: 33.2 g/dL (ref 30.0–36.0)
MCV: 97.2 fL (ref 80.0–100.0)
Monocytes Absolute: 0.7 10*3/uL (ref 0.1–1.0)
Monocytes Relative: 12 %
Neutro Abs: 2.7 10*3/uL (ref 1.7–7.7)
Neutrophils Relative %: 48 %
Platelets: 228 10*3/uL (ref 150–400)
RBC: 2.48 MIL/uL — ABNORMAL LOW (ref 4.22–5.81)
RDW: 14.9 % (ref 11.5–15.5)
WBC: 5.4 10*3/uL (ref 4.0–10.5)
nRBC: 0 % (ref 0.0–0.2)

## 2019-07-31 LAB — GLUCOSE, CAPILLARY
Glucose-Capillary: 70 mg/dL (ref 70–99)
Glucose-Capillary: 71 mg/dL (ref 70–99)
Glucose-Capillary: 74 mg/dL (ref 70–99)
Glucose-Capillary: 76 mg/dL (ref 70–99)
Glucose-Capillary: 76 mg/dL (ref 70–99)
Glucose-Capillary: 81 mg/dL (ref 70–99)
Glucose-Capillary: 85 mg/dL (ref 70–99)

## 2019-07-31 MED ORDER — OSMOLITE 1.2 CAL PO LIQD
1000.0000 mL | ORAL | Status: DC
  Administered 2019-07-31: 1000 mL
  Filled 2019-07-31 (×5): qty 1000

## 2019-07-31 NOTE — Progress Notes (Signed)
Verbal orders from MD to hold tube feeding and all per tube meds until tomorrow morning.

## 2019-07-31 NOTE — Progress Notes (Signed)
[facility] for Infectious Disease  Date of Admission:  07/22/2019      Total days of antibiotics 7  Day 3 Pyramethamine-Leucovorin   Day 24sulfadiazine   Day 3 metronidazole            ASSESSMENT: TUNIS GENTLE is a 68 y.o. male with HIV, +AIDS (VL 90, CD4 189) here with encephalopathy in the setting of multiple brain abscesses. Peripheral Toxo IgG positive.  Toxoplasma Encephalitis -  Continue regimen as described above. Will need repeat head imaging after 2 weeks of therapy to follow response. No changes from ID perspective.   Hep C + RNA 1 million - will address outpatient for treatment after current acute needs are met.   +RPR - serofast compared to previous titers. No treatment indicated   ?sulfa drug rash - no symptoms suggesting rash with sulfadiazine so far. Continue and monitor.   Rhonchi - suspect aspiration given enteral feeds. Continue metronidazole through the weekend.  RT working with him on clearing airway.   Goals of Care - noted PMT discussion.    PLAN: 1. Continue Pyrimethamine-leucovorin V.T. 2. Continue Sulfadiazine VT  3. Continue Dapsone 4. Hold biktarvy 5. MRI repeat after 2 weeks 6. Metronidazole x 2 more days   Dr. Tommy Medal is available over the weekend for ID related questions if needed. Otherwise will see the patient back next week.    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 aspirin  81 mg Per Tube Daily  . clopidogrel  75 mg Per Tube Daily  . dapsone  100 mg Per Tube Daily  . enoxaparin (LOVENOX) injection  40 mg Subcutaneous Q24H  . influenza vaccine adjuvanted  0.5 mL Intramuscular Tomorrow-1000  . insulin  aspart  0-9 Units Subcutaneous Q4H  . Pyrimethamine-Leucovorin  50 mg Per Tube Daily  . sulfaDIAZINE  1,000 mg Per Tube Q6H  . thiamine  100 mg Per Tube Daily    Allergies  Allergen Reactions  . Sulfamethoxazole Micro [Sulfamethoxazole] Rash  . Trimethoprim Rash    OBJECTIVE: Vitals:   07/30/19 2303 07/31/19 0032 07/31/19 0451 07/31/19 0744  BP:  (!) 150/106 (!) 145/106 (!) 146/85  Pulse: 80 79 81 74  Resp: '20 19 17 17  '$ Temp:  98.7 F (37.1 C) 97.8 F (36.6 C) 99.1 F (37.3 C)  TempSrc:  Oral Oral Oral  SpO2: 98% 100% 98% 98%   There is no height or weight on file to calculate BMI.   Lab Results Lab Results  Component Value Date   WBC 5.4 07/31/2019   HGB 8.0 (L) 07/31/2019   HCT 24.1 (L) 07/31/2019   MCV 97.2 07/31/2019   PLT 228 07/31/2019    Lab Results  Component Value Date   CREATININE 0.86 07/31/2019   BUN 14 07/31/2019   NA 135 07/31/2019   K 3.8 07/31/2019   CL 105 07/31/2019   CO2 20 (L) 07/31/2019    Lab Results  Component Value Date   ALT 20 07/30/2019   AST 36 07/30/2019   ALKPHOS 73 07/30/2019   BILITOT 0.3 07/30/2019     Microbiology: Recent Results (from the past 240 hour(s))  SARS CORONAVIRUS 2 (TAT 6-24 HRS) Nasopharyngeal Nasopharyngeal Swab     Status: None   Collection Time: 07/23/19  5:35 AM   Specimen: Nasopharyngeal Swab  Result Value Ref Range Status   SARS Coronavirus 2 NEGATIVE NEGATIVE Final    Comment: (NOTE) SARS-CoV-2 target nucleic acids are NOT DETECTED. The SARS-CoV-2 RNA is generally detectable in upper and lower respiratory specimens during the acute phase of infection. Negative results do not preclude SARS-CoV-2 infection, do not rule out co-infections with other pathogens, and should not be used as the sole basis for treatment or other patient management decisions. Negative results must be combined with clinical observations, patient history, and epidemiological information. The expected result is Negative.  Fact Sheet for Patients: SugarRoll.be Fact Sheet for Healthcare Providers: https://www.woods-mathews.com/ This test is not yet approved or cleared by the [place] FDA and  has been authorized for detection and/or diagnosis of SARS-CoV-2 by FDA under an Emergency Use Authorization (EUA). This EUA will remain  in effect (meaning this test can be used) for the duration of the COVID-19 declaration under Section 56 4(b)(1) of the Act, 21 U.S.C. section 360bbb-3(b)(1), unless the authorization is terminated or revoked sooner. Performed at [facility], [place] [address], [place], [place] [address]   Culture, blood (routine x 2)     Status: None (Preliminary result)   Collection Time: 07/28/19  4:10 PM   Specimen: BLOOD  Result Value Ref Range Status   Specimen Description BLOOD LEFT ANTECUBITAL  Final   Special Requests   Final    BOTTLES DRAWN AEROBIC ONLY Blood Culture adequate volume   Culture   Final    NO GROWTH 2 DAYS Performed at [facility], [place] [address] [place], [place] [address]    Report Status PENDING  Incomplete  Culture, blood (routine x 2)     Status: None (Preliminary result)   Collection Time: 07/28/19  4:15 PM   Specimen: BLOOD  Result Value Ref Range Status   Specimen Description BLOOD LEFT ANTECUBITAL  Final   Special Requests   Final    BOTTLES DRAWN AEROBIC ONLY Blood Culture adequate volume   Culture   Final    NO GROWTH 2 DAYS Performed at [facility], [place] [address], [place], [place] [address]    Report Status PENDING  Incomplete  MRSA PCR Screening     Status: None   Collection Time: 07/28/19  4:22 PM   Specimen: Nasal Mucosa; Nasopharyngeal  Result Value Ref Range Status   MRSA by PCR NEGATIVE NEGATIVE Final    Comment:        The GeneXpert MRSA Assay (FDA approved for NASAL specimens only), is one component of a comprehensive MRSA colonization surveillance program. It is not  intended to diagnose MRSA infection nor to guide or monitor treatment for MRSA infections. Performed at [facility], [place] [address], [place], [place] [address]     Janene Madeira, MSN, NP-C [facility] [email or web address] Pager: [phone] Office: [place]: [phone]

## 2019-07-31 NOTE — Progress Notes (Signed)
Pt NG tube in place during report. Upon returning to the room the NG tube had been completely removed, even in the presence of bilateral soft wrist restraints.   Tube feeding stopped and MD notified. Awaiting orders.

## 2019-07-31 NOTE — Progress Notes (Signed)
  Speech Language Pathology Treatment: Dysphagia  Patient Details Name: Henry Oconnell MRN: [id number] DOB: [birth year] Today's Date: 07/31/2019 Time: 0932-3557 SLP Time Calculation (min) (ACUTE ONLY): 11 min  Assessment / Plan / Recommendation Clinical Impression  Patient lying with eyes closed. Open eyes when name called. When asked if he thought he could swallow he nodded yes. Could audibly hear congestion and asked pt to cough. His cough was very congested and not strong enough to clear the congestion. Oral care was provided and ice chips presented. Pt tightly closed his lips and turned his X 2. No further attempts were made. ST will continue seeing pt for PO readiness.   HPI HPI: [name], 68 y/m, transfered from [facility] for infectious disease input and possible advanced care. PMH: h/o HIV,  non-ST elevation MI, poly substance abuse, VF arrest and recent admission to [facility]. for acute encephalopathy. 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 No previous ST services.       SLP Plan  Continue with current plan of care       Recommendations  Diet recommendations: NPO                Plan: Continue with current plan of care       [place], [place], CCC-SLP 07/31/2019, 12:16 PM

## 2019-07-31 NOTE — Progress Notes (Signed)
Hospitalist progress note  If 7PM-7AM,  night-coverage-look on AMION -prefer pages-not epic chat,please  Henry Oconnell  QAS:[id number] DOB: 07/20/1951 DOA: 07/22/2019 PCP: Patient, No Pcp Per  Narrative:  68 year old male COPD/asthma VF arrest 2018 hep C, history of IV drug use, HIV noncompliant with antiretrovirals for over 7 months, NSTEMI Rx [facility]F you 07/05/19/2020-10/14 2020-also prior heart attack 5 to 6 months prior to the symptoms in hospital for 2 weeks had Zio patch at that time was treated with heparin loaded with aspirin and told to continue meds was not placed on other meds on discharge because of poor compliance borderline bradycardia Presented to [facility] ~ 10/25 for acute encephalopathy-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 but also aspiration continue atovaquone and sulfadiazine-pyrimethamine started 11/3 Treat pneumonia with Flagyl ending on 11/8 Needs MRI brain as per ID on 11/13 Probable aspiration-NG tube placed 10/31 White count normal, chest x-ray my over read 11/5, 11/3 films show probable aspiration Appreciate palliative care follow-up and expect further goals of care going forward to determine planning--- he has an NG tube we will resume feeds cautiously at 30 cc an hour-if he continues to have thick secretions we will hold NG feeds HIV Holding Biktarvy at this time VL 90-deferring care to ID Prior concerns for alcoholism Discontinuing CIWA Untreated  hepatitis C? Will need outpatient management if he survives this admission RPR positive Defer to ID Dilutional anemia versus acute blood loss anemia Monitor trends-has dropped to a low of 7.0 and is currently up to 8.0-almost certainly secondary to chronic disease Recent NSTEMI in the setting of another subacute 1 5 to 6 months previously Remote history of VF arrest 2018 Continue Plavix aspirin statin at this time Keep on monitors if allows  DVT prophylaxis: Lovenox  Code Status:   Full   Family Communication:   Lucien Mons on 11/4 and updated Disposition Plan: inpatient  Consultants:   Neurosurgery  ID   Palliative Procedures:   Mri \  CT scan Antimicrobials:   Atavaquone  Pyrimethamine  Sulfadiazine  Vancomycin    Subjective: Awakens knows some orientation factors but then rambles tangentially about care of his brother moving furniture and is not making much sense Nursing reports the same Overall no high-grade fever is from chart review Note RT has been trying to suction Objective: Vitals:   07/31/19 0032 07/31/19 0451 07/31/19 0744 07/31/19 1209  BP: (!) 150/106 (!) 145/106 (!) 146/85 (!) 136/93  Pulse: 79 81 74 77  Resp: 19 17 17 17   Temp: 98.7 F (37.1 C) 97.8 F (36.6 C) 99.1 F (37.3 C) 97.8 F (36.6 C)  TempSrc: Oral Oral Oral   SpO2: 100% 98% 98% 97%    Intake/Output Summary (Last 24 hours) at 07/31/2019 1304 Last data filed at 07/31/2019 0451 Gross per 24 hour  Intake -  Output 1160 ml  Net -1160 ml   There were no vitals filed for this visit.  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 no icterus no pallor Course breath sounds bilaterally S1-S2 no murmur rub or  gallop Abdomen soft NG tube left nares Rash to lower extremities ROM intact but limited on left side as does have a physical restraint   Data Reviewed: I have personally reviewed following labs and imaging studies CBC: Recent Labs  Lab  07/26/19 0802 07/28/19 1112 07/29/19 0300 07/30/19 0813 07/31/19 0314  WBC 7.8 6.0 4.6 4.8 5.4  NEUTROABS  --  4.0 3.3  --  2.7  HGB 9.6* 7.8* 7.0* 7.6* 8.0*  HCT 28.4* 23.6* 21.0* 22.7* 24.1*  MCV 95.9 97.1 94.2 95.4 97.2  PLT 166 171 174 194 998   Basic Metabolic Panel: Recent Labs  Lab 07/26/19 0353 07/27/19 0603 07/28/19 1112 07/29/19 0300 07/30/19 0315 07/31/19 0314  NA 131*  --  136 136 135 135  K 4.0  --  4.0 4.2 4.3 3.8  CL 102  --  105 107 106 105  CO2 24  --  21* 22 20* 20*  GLUCOSE 115*  --  125* 119* 90 81  BUN 6*  --  16 14 13 14   CREATININE 0.64  --  0.58* 0.74 0.87 0.86  CALCIUM 8.3*  --  8.4* 8.3* 8.9 9.0  MG 1.5* 2.0  --   --   --   --   PHOS 2.8  --   --   --   --   --    GFR: CrCl cannot be calculated (Unknown ideal weight.). Liver Function Tests: Recent Labs  Lab 07/30/19 0315  AST 36  ALT 20  ALKPHOS 73  BILITOT 0.3  PROT 7.0  ALBUMIN 3.2*   No results for input(s): LIPASE, AMYLASE in the last 168 hours. No results for input(s): AMMONIA in the last 168 hours. Coagulation Profile: Recent Labs  Lab 07/30/19 0315  INR 1.1   Cardiac Enzymes: Radiology Studies: Reviewed images personally in health database  Scheduled Meds: . aspirin  81 mg Per Tube Daily  . clopidogrel  75 mg Per Tube Daily  . dapsone  100 mg Per Tube Daily  . enoxaparin (LOVENOX) injection  40 mg Subcutaneous Q24H  . influenza vaccine adjuvanted  0.5 mL Intramuscular Tomorrow-1000  . insulin aspart  0-9 Units Subcutaneous Q4H  . Pyrimethamine-Leucovorin  50 mg Per Tube Daily  . sulfaDIAZINE  1,000 mg Per Tube Q6H  . thiamine  100 mg Per Tube Daily   Continuous Infusions: . metronidazole 500 mg (07/31/19 0533)    LOS: 9 days   Time spent: [place], MD [facility]  07/31/2019, 1:04 PM

## 2019-08-01 ENCOUNTER — Inpatient Hospital Stay (HOSPITAL_COMMUNITY): Payer: Medicare Other

## 2019-08-01 LAB — GLUCOSE, CAPILLARY
Glucose-Capillary: 68 mg/dL — ABNORMAL LOW (ref 70–99)
Glucose-Capillary: 78 mg/dL (ref 70–99)
Glucose-Capillary: 89 mg/dL (ref 70–99)
Glucose-Capillary: 94 mg/dL (ref 70–99)
Glucose-Capillary: 89 mg/dL (ref 70–99)
Glucose-Capillary: 109 mg/dL — ABNORMAL HIGH (ref 70–99)
Glucose-Capillary: 88 mg/dL (ref 70–99)

## 2019-08-01 LAB — RENAL FUNCTION PANEL
Albumin: 3.3 g/dL — ABNORMAL LOW (ref 3.5–5.0)
Anion gap: 9 (ref 5–15)
BUN: 13 mg/dL (ref 8–23)
CO2: 20 mmol/L — ABNORMAL LOW (ref 22–32)
Calcium: 9.1 mg/dL (ref 8.9–10.3)
Chloride: 106 mmol/L (ref 98–111)
Creatinine, Ser: 0.9 mg/dL (ref 0.61–1.24)
GFR calc Af Amer: >60 mL/min (ref >60)
GFR calc non Af Amer: >60 mL/min (ref >60)
Glucose, Bld: 83 mg/dL (ref 70–99)
Phosphorus: 4.4 mg/dL (ref 2.5–4.6)
Potassium: 3.6 mmol/L (ref 3.5–5.1)
Sodium: 135 mmol/L (ref 135–145)

## 2019-08-01 LAB — CBC WITH DIFFERENTIAL/PLATELET
Abs Immature Granulocytes: 0.03 10*3/uL (ref 0.00–0.07)
Basophils Absolute: 0 10*3/uL (ref 0.0–0.1)
Basophils Relative: 0 %
Eosinophils Absolute: 0.1 10*3/uL (ref 0.0–0.5)
Eosinophils Relative: 1 %
HCT: 23.9 % — ABNORMAL LOW (ref 39.0–52.0)
Hemoglobin: 8.1 g/dL — ABNORMAL LOW (ref 13.0–17.0)
Immature Granulocytes: 1 %
Lymphocytes Relative: 40 %
Lymphs Abs: 2.1 10*3/uL (ref 0.7–4.0)
MCH: 31.6 pg (ref 26.0–34.0)
MCHC: 33.9 g/dL (ref 30.0–36.0)
MCV: 93.4 fL (ref 80.0–100.0)
Monocytes Absolute: 0.7 10*3/uL (ref 0.1–1.0)
Monocytes Relative: 13 %
Neutro Abs: 2.3 10*3/uL (ref 1.7–7.7)
Neutrophils Relative %: 45 %
Platelets: 212 10*3/uL (ref 150–400)
RBC: 2.56 MIL/uL — ABNORMAL LOW (ref 4.22–5.81)
RDW: 14.6 % (ref 11.5–15.5)
WBC: 5.2 10*3/uL (ref 4.0–10.5)
nRBC: 0 % (ref 0.0–0.2)

## 2019-08-01 MED ORDER — DEXTROSE 5 % IV SOLN
INTRAVENOUS | Status: DC
  Administered 2019-08-01 – 2019-08-04 (×4): via INTRAVENOUS

## 2019-08-01 MED ORDER — PYRIMETHAMINE-LEUCOVORIN 50-25 MG PO CAPS
50.0000 mg | ORAL_CAPSULE | Freq: Every day | ORAL | Status: DC
  Administered 2019-08-01 – 2019-08-06 (×5): 50 mg via ORAL
  Filled 2019-08-01 (×5): qty 1

## 2019-08-01 MED ORDER — DEXTROSE 50 % IV SOLN
INTRAVENOUS | Status: AC
  Administered 2019-08-01: 25 mL
  Filled 2019-08-01: qty 50

## 2019-08-01 MED ORDER — SULFADIAZINE 500 MG PO TABS
1000.0000 mg | ORAL_TABLET | Freq: Four times a day (QID) | ORAL | Status: DC
  Administered 2019-08-01 – 2019-08-07 (×20): 1000 mg via ORAL
  Filled 2019-08-01 (×25): qty 2

## 2019-08-01 NOTE — Progress Notes (Signed)
Pt CBG 68 and NPO. Gave 1/2 amp 50% dextrose IV. Notified MD, awaiting orders and will continue to monitor

## 2019-08-01 NOTE — Progress Notes (Signed)
Gave PO meds crushed with small bites of applesauce per SLP note. Pt tolerated well, no signs of aspiration.

## 2019-08-01 NOTE — Evaluation (Signed)
Clinical/Bedside Swallow Evaluation Patient Details  Name: Henry Oconnell MRN: [id number] Date of Birth: 06-21-1951  Today's Date: 08/01/2019 Time: SLP Start Time (ACUTE ONLY): 1520 SLP Stop Time (ACUTE ONLY): 1528 SLP Time Calculation (min) (ACUTE ONLY): 8 min  Past Medical History:  Past Medical History:  Diagnosis Date  . Asthma   . COPD (chronic obstructive pulmonary disease) ([place])   . Hep C w/ coma, chronic ([place])   . HIV (human immunodeficiency virus infection) ([place])   . Hypertension    Past Surgical History: History reviewed. No pertinent surgical history. HPI:  [name], 68 y/m, transfered from [facility] for infectious disease input and possible advanced care. PMH: h/o HIV,  non-ST elevation MI, poly substance abuse, VF arrest and recent admission to [facility]. for acute encephalopathy. 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   Assessment / Plan / Recommendation Clinical Impression   SLP was re-consulted after pt pulled both his NGT and Cortrak.  Per MD report, pt was started on a clear liquids diet after tube was pulled with known risk of aspiration and possible placement with hospice at discharge.  During today's evaluation, pt consumed minimal amounts of thin liquids via cup sips without overt s/s of aspiration but he declined any further POs when offered.  In addition to his elevated risk of dysphagia stemming from his multiple co-morbidities, pt is also at risk of inadequate hydration and nutrition regardless of diet due to significant deconditioning impacting his PO intake.  Pt overall has little interest in eating and drinking per chart review and observed during today's evaluation.  For now, a clear liquids diet seems reasonably appropriate for pt's current functional level.  SLP will continue to follow along to make recommendations and diet modifications as indicated by further discussion with palliative  medicine team.  SLP Visit Diagnosis: Dysphagia, unspecified (R13.10)    Aspiration Risk  Mild aspiration risk    Diet Recommendation Other (Comment)(clear liquids, per MD)   Liquid Administration via: Cup Medication Administration: Crushed with puree Supervision: Full supervision/cueing for compensatory strategies Compensations: Minimize environmental distractions;Slow rate;Small sips/bites Postural Changes: Seated upright at 90 degrees;Remain upright for at least 30 minutes after po intake    Other  Recommendations Oral Care Recommendations: Oral care QID;Staff/trained caregiver to provide oral care   Follow up Recommendations Skilled Nursing facility;24 hour supervision/assistance      Frequency and Duration min 1 x/week          Prognosis Prognosis for Safe Diet Advancement: Fair      Swallow Study   General HPI: [name], 68 y/m, transfered from [facility] for infectious disease input and possible advanced care. PMH: h/o HIV,  non-ST elevation MI, poly substance abuse, VF arrest and recent admission to [facility] for acute encephalopathy. 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 No previous ST services.  Type of Study: Bedside Swallow Evaluation Previous Swallow Assessment: 07/23/2019 Diet Prior to this Study: Other (Comment)(full liquids) Temperature Spikes Noted: No Respiratory Status: Room air History of Recent Intubation: No Behavior/Cognition: Cooperative;Lethargic/Drowsy Oral Cavity - Dentition: Missing dentition Vision: Functional for self-feeding Self-Feeding Abilities: Able to feed self;Needs assist Patient Positioning: Upright in bed Baseline Vocal Quality: Low vocal intensity Volitional Cough: Weak;Congested    Oral/Motor/Sensory Function Overall Oral Motor/Sensory Function: Generalized oral weakness   Ice Chips     Thin Liquid Thin Liquid: Within functional limits Presentation: Cup  Nectar Thick     Honey Thick     Puree     Solid            Henry Oconnell, Henry Oconnell L 08/01/2019,4:34 PM

## 2019-08-01 NOTE — Progress Notes (Signed)
Hospitalist progress note  If 7PM-7AM,  night-coverage-look on AMION -prefer pages-not epic chat,please  Henry Oconnell  DXI:[id number] DOB: 1950-12-11 DOA: 07/22/2019 PCP: Patient, No Pcp Per  Narrative:  68 year old male COPD/asthma VF arrest 2018 hep C, history of IV drug use, HIV noncompliant with antiretrovirals for over 7 months, NSTEMI Rx WF you 07/05/19/2020-10/14 2020-also prior heart attack 5 to 6 months prior to the symptoms in hospital for 2 weeks had Zio patch at that time was treated with heparin loaded with aspirin and told to continue meds was not placed on other meds on discharge because of poor compliance borderline bradycardia Presented to [facility] ~ 10/25 for acute encephalopathy-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 but also aspiration continue atovaquone and sulfadiazine-pyrimethamine started 11/3--he has no tube in place currrently-see below Treat pneumonia with Flagyl ending on 11/8 Needs MRI brain as per ID on 11/13 Probable aspiration-NG tube placed 10/31 White count normal, chest x-ray my over read 11/5, 11/3 films show probable aspiration Pulled Cortrack and NG Allow clear liquids-will d/w family re: implications of eventual likely aspiration and then decline-- will need Hopsice on d/c home for continued aspiration HIV Holding Biktarvy at this time VL 90-deferring care to ID Prior concerns for alcoholism Discontinuing CIWA Untreated hepatitis  C? Will need outpatient management if he survives this admission RPR positive Defer to ID Dilutional anemia versus acute blood loss anemia Monitor trends-between 7 and 8 Recent NSTEMI in the setting of another subacute 1 5 to 6 months previously Remote history of VF arrest 2018 Continue Plavix aspirin statin at this time Keep on monitors if allows  DVT prophylaxis: Lovenox  Code Status:   Full   Family Communication:   Rosana Hoes on 11/4 and updated Disposition Plan: inpatient  Consultants:   Neurosurgery  ID   Palliative Procedures:   Mri \  CT scan Antimicrobials:   Atavaquone  Pyrimethamine  Sulfadiazine  Vancomycin    Subjective:  Removed Cortrack last night and NG tube this am NPO at present time Sugars noted to be low normal Said he pulled out tube "because he wanted air" "i'd like a sandwich"  Objective: Vitals:   08/01/19 0002 08/01/19 0419 08/01/19 0820 08/01/19 1142  BP: (!) 155/92 (!) 146/79 138/84 122/75  Pulse: 76 75 70 78  Resp: 18 18 16  (!) 22  Temp: 98.3 F (36.8 C) 97.6 F (36.4 C) 98.4 F (36.9 C) 98.8 F (37.1 C)  TempSrc: Axillary Oral Axillary Oral  SpO2: 97% 95% 96% 97%    Intake/Output Summary (Last 24 hours) at 08/01/2019 1336 Last data filed at 08/01/2019 0419 Gross per 24 hour  Intake 565.75 ml  Output 1150 ml  Net -584.25 ml   There were no vitals filed for this visit.  Examination: Ill-appearing black male  Course breath sounds bilaterally S1-S2 no murmur rub or gallop Abdomen soft Rash to lower extremities ROM intact but limited on left side as does have a physical restraint   Data Reviewed: I have personally reviewed following labs and imaging studies CBC:  Recent Labs  Lab 07/28/19 1112 07/29/19 0300 07/30/19 0813 07/31/19 0314 08/01/19 0256  WBC 6.0 4.6 4.8 5.4 5.2  NEUTROABS 4.0 3.3  --  2.7 2.3  HGB 7.8* 7.0* 7.6* 8.0* 8.1*  HCT 23.6* 21.0* 22.7* 24.1* 23.9*  MCV 97.1 94.2 95.4 97.2 93.4   PLT 171 174 194 228 212   Basic Metabolic Panel: Recent Labs  Lab 07/26/19 0353 07/27/19 0603 07/28/19 1112 07/29/19 0300 07/30/19 0315 07/31/19 0314 08/01/19 0256  NA 131*  --  136 136 135 135 135  K 4.0  --  4.0 4.2 4.3 3.8 3.6  CL 102  --  105 107 106 105 106  CO2 24  --  21* 22 20* 20* 20*  GLUCOSE 115*  --  125* 119* 90 81 83  BUN 6*  --  16 14 13 14 13   CREATININE 0.64  --  0.58* 0.74 0.87 0.86 0.90  CALCIUM 8.3*  --  8.4* 8.3* 8.9 9.0 9.1  MG 1.5* 2.0  --   --   --   --   --   PHOS 2.8  --   --   --   --   --  4.4   GFR: CrCl cannot be calculated (Unknown ideal weight.). Liver Function Tests: Recent Labs  Lab 07/30/19 0315 08/01/19 0256  AST 36  --   ALT 20  --   ALKPHOS 73  --   BILITOT 0.3  --   PROT 7.0  --   ALBUMIN 3.2* 3.3*   No results for input(s): LIPASE, AMYLASE in the last 168 hours. No results for input(s): AMMONIA in the last 168 hours. Coagulation Profile: Recent Labs  Lab 07/30/19 0315  INR 1.1   Cardiac Enzymes: Radiology Studies: Reviewed images personally in health database  Scheduled Meds: . aspirin  81 mg Per Tube Daily  . clopidogrel  75 mg Per Tube Daily  . dapsone  100 mg Per Tube Daily  . enoxaparin (LOVENOX) injection  40 mg Subcutaneous Q24H  . feeding supplement (OSMOLITE 1.2 CAL)  1,000 mL Per Tube Q24H  . influenza vaccine adjuvanted  0.5 mL Intramuscular Tomorrow-1000  . insulin aspart  0-9 Units Subcutaneous Q4H  . Pyrimethamine-Leucovorin  50 mg Per Tube Daily  . sulfaDIAZINE  1,000 mg Per Tube Q6H  . thiamine  100 mg Per Tube Daily   Continuous Infusions: . dextrose 75 mL/hr at 08/01/19 0819  . metronidazole 500 mg (08/01/19 0501)    LOS: 10 days   Time spent: 40 13/07/20, MD [facility]  08/01/2019, 1:36 PM

## 2019-08-01 NOTE — Progress Notes (Signed)
Nasogastric tube placed at 0948. Patient removed NG tube around 1120 despite being in soft wrist restraints and hand mitts. MD notified, and advised nurse to leave NG tube out. Nurse will continue to monitor patient. Gwendolyn Grant, RN

## 2019-08-01 NOTE — Progress Notes (Signed)
Patient diet changed from NPO to full liquid diet. MD advised nurse to attempt to give patient meds PO with sips of liquid. Patient tolerated meds fairly well  PO with minimal coughing. Nurse will continue to monitor for signs of aspiration. Gwendolyn Grant, RN

## 2019-08-01 NOTE — Progress Notes (Signed)
Rechecked CBG now 78. No new orders, will monitor

## 2019-08-02 LAB — GLUCOSE, CAPILLARY
Glucose-Capillary: 89 mg/dL (ref 70–99)
Glucose-Capillary: 91 mg/dL (ref 70–99)
Glucose-Capillary: 190 mg/dL — ABNORMAL HIGH (ref 70–99)
Glucose-Capillary: 86 mg/dL (ref 70–99)
Glucose-Capillary: 50 mg/dL — ABNORMAL LOW (ref 70–99)
Glucose-Capillary: 136 mg/dL — ABNORMAL HIGH (ref 70–99)
Glucose-Capillary: 64 mg/dL — ABNORMAL LOW (ref 70–99)

## 2019-08-02 LAB — CULTURE, BLOOD (ROUTINE X 2)
Culture: NO GROWTH
Culture: NO GROWTH
Special Requests: ADEQUATE
Special Requests: ADEQUATE

## 2019-08-02 MED ORDER — ACETAMINOPHEN 650 MG RE SUPP: 650.0000 mg | Freq: Four times a day (QID) | RECTAL | Status: DC | PRN

## 2019-08-02 MED ORDER — DAPSONE 100 MG PO TABS
100.0000 mg | ORAL_TABLET | Freq: Every day | ORAL | Status: DC
  Administered 2019-08-02 – 2019-08-07 (×6): 100 mg via ORAL
  Filled 2019-08-02 (×6): qty 1

## 2019-08-02 MED ORDER — ACETAMINOPHEN 325 MG PO TABS: 650.0000 mg | ORAL_TABLET | Freq: Four times a day (QID) | ORAL | Status: DC | PRN

## 2019-08-02 MED ORDER — ASPIRIN 81 MG PO CHEW
81.0000 mg | CHEWABLE_TABLET | Freq: Every day | ORAL | Status: DC
  Administered 2019-08-02 – 2019-08-07 (×6): 81 mg via ORAL
  Filled 2019-08-02 (×5): qty 1

## 2019-08-02 MED ORDER — VITAMIN B-1 100 MG PO TABS
100.0000 mg | ORAL_TABLET | Freq: Every day | ORAL | Status: DC
  Administered 2019-08-02 – 2019-08-07 (×6): 100 mg via ORAL
  Filled 2019-08-02 (×5): qty 1

## 2019-08-02 MED ORDER — CLOPIDOGREL BISULFATE 75 MG PO TABS
75.0000 mg | ORAL_TABLET | Freq: Every day | ORAL | Status: DC
  Administered 2019-08-02 – 2019-08-07 (×6): 75 mg via ORAL
  Filled 2019-08-02 (×5): qty 1

## 2019-08-02 MED ORDER — ONDANSETRON HCL 4 MG PO TABS: 4.0000 mg | ORAL_TABLET | Freq: Four times a day (QID) | ORAL | Status: DC | PRN

## 2019-08-02 MED ORDER — ONDANSETRON HCL 4 MG/2ML IJ SOLN: 4.0000 mg | Freq: Four times a day (QID) | INTRAMUSCULAR | Status: DC | PRN

## 2019-08-02 NOTE — Progress Notes (Addendum)
Hospitalist progress note  If 7PM-7AM,  night-coverage-look on AMION -prefer pages-not epic chat,please  Henry Oconnell  FUX:[id number] DOB: 08-19-1951 DOA: 07/22/2019 PCP: Patient, No Pcp Per  Narrative:  68 year old male COPD/asthma VF arrest 2018 hep C, history of IV drug use, HIV noncompliant with antiretrovirals for over 7 months, NSTEMI Rx WF you 07/05/19/2020-10/14 2020-also prior heart attack 5 to 6 months prior to the symptoms in hospital for 2 weeks had Zio patch at that time was treated with heparin loaded with aspirin and told to continue meds was not placed on other meds on discharge because of poor compliance borderline bradycardia Presented to [facility] ~ 10/25 for acute encephalopathy-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 Will need outpatient management if he survives this admission RPR positive Defer to ID Dilutional anemia versus acute  blood loss anemia Monitor trends-between 7 and 8 typically Recent NSTEMI in the setting of another subacute 1 about 6 months previously Remote history of VF arrest 2018 Continue Plavix aspirin statin at this time  DVT prophylaxis: Lovenox  Code Status:   Full   Family Communication:   Lucien Mons on 11/8 Disposition Plan: inpatient--need SNF hosp bed and 3 in 1  Consultants:   Neurosurgery  ID   Palliative Procedures:   Mri \  CT scan Antimicrobials:   Atavaquone  Pyrimethamine  Sulfadiazine  Vancomycin    Subjective:  Much more coherent-occasional confusions but stronger speech and more orietned to itime aand place Tells me he "ran out" of HIV meds and is willing to take them now No overt pain   Objective: Vitals:   08/01/19 1917 08/01/19 2318 08/02/19 0311 08/02/19 0744  BP: 129/67 138/75 129/66 135/87  Pulse: 61 73 64 77  Resp: 18 18 18 14   Temp: 98 F (36.7 C) 98.3 F (36.8 C) 98 F (36.7 C) 98.2 F (36.8 C)  TempSrc: Oral Oral Oral Oral  SpO2: 99% 98% 98%     Intake/Output Summary (Last 24 hours) at 08/02/2019 1156 Last data filed at 08/02/2019 0327 Gross per 24 hour  Intake 1588.28 ml  Output 1750 ml  Net -161.72 ml   There were no vitals filed for this visit.  Examination:  Coherent without distress seemss happy Asking about diet No fever Still slight wet cough at times   Data Reviewed: I have personally reviewed following labs and imaging studies CBC: Recent Labs  Lab 07/28/19 1112 07/29/19 0300 07/30/19 0813 07/31/19 0314 08/01/19 0256  WBC 6.0 4.6 4.8 5.4 5.2  NEUTROABS 4.0 3.3  --  2.7 2.3  HGB 7.8* 7.0* 7.6* 8.0* 8.1*  HCT 23.6* 21.0* 22.7* 24.1* 23.9*  MCV 97.1 94.2 95.4 97.2 93.4  PLT 171 174 194 228 166   Basic Metabolic Panel: Recent Labs  Lab 07/27/19 0603 07/28/19 1112 07/29/19 0300 07/30/19 0315 07/31/19 0314 08/01/19 0256  NA  --  136 136 135 135 135  K  --  4.0 4.2 4.3 3.8 3.6  CL  --  105  107 106 105 106  CO2  --  21* 22 20* 20* 20*  GLUCOSE  --  125* 119* 90 81 83  BUN  --  16 14 13 14 13   CREATININE  --  0.58* 0.74 0.87 0.86 0.90  CALCIUM  --  8.4* 8.3* 8.9 9.0 9.1  MG 2.0  --   --   --   --   --   PHOS  --   --   --   --   --  4.4   GFR: CrCl cannot be calculated (Unknown ideal weight.). Liver Function Tests: Recent Labs  Lab 07/30/19 0315 08/01/19 0256  AST 36  --   ALT 20  --   ALKPHOS 73  --   BILITOT 0.3  --   PROT 7.0  --   ALBUMIN 3.2* 3.3*   No results for input(s): LIPASE, AMYLASE in the last 168 hours. No results for input(s): AMMONIA in the last 168 hours. Coagulation Profile: Recent Labs  Lab 07/30/19 0315  INR 1.1   Cardiac Enzymes: Radiology Studies: Reviewed images personally in health database  Scheduled Meds: . aspirin  81 mg Oral Daily  . clopidogrel  75 mg Oral Daily  . dapsone  100 mg Oral Daily  . enoxaparin (LOVENOX) injection  40 mg Subcutaneous Q24H  . feeding supplement (OSMOLITE 1.2 CAL)  1,000 mL Per Tube Q24H  . influenza vaccine adjuvanted  0.5 mL Intramuscular Tomorrow-1000  . insulin aspart  0-9 Units Subcutaneous Q4H  . Pyrimethamine-Leucovorin  50 mg Oral Daily  . sulfaDIAZINE  1,000 mg Oral Q6H  . thiamine  100 mg Oral Daily   Continuous Infusions: . dextrose 75 mL/hr at 08/02/19 0327  . metronidazole 500 mg (08/02/19 0504)    LOS: 11 days   Time spent: [place], MD [facility]  08/02/2019, 11:56 AM

## 2019-08-02 NOTE — Progress Notes (Signed)
IV in place during bed side shift report. Returned 15 mins later to complete assessment. Upon assessment IV was laying in bed.   Placed IV consult order and re-instated bilateral soft wrist restraints per order.   Pt CBG 50, MD notified. Gave PO orange juice and pt requested pudding. Will recheck in 15 mins and continue to monitor.

## 2019-08-02 NOTE — Progress Notes (Addendum)
CBG recheck 64. Will give more PO carbs and recheck in another 15 mins.   CBG recheck 136. Will continue to monitor.

## 2019-08-03 DIAGNOSIS — Z515 Encounter for palliative care: Secondary | ICD-10-CM

## 2019-08-03 DIAGNOSIS — R1310 Dysphagia, unspecified: Secondary | ICD-10-CM

## 2019-08-03 DIAGNOSIS — B182 Chronic viral hepatitis C: Secondary | ICD-10-CM

## 2019-08-03 LAB — CBC WITH DIFFERENTIAL/PLATELET
Abs Immature Granulocytes: 0 10*3/uL (ref 0.00–0.07)
Basophils Absolute: 0 10*3/uL (ref 0.0–0.1)
Basophils Relative: 0 %
Eosinophils Absolute: 0.1 10*3/uL (ref 0.0–0.5)
Eosinophils Relative: 3 %
HCT: 24.3 % — ABNORMAL LOW (ref 39.0–52.0)
Hemoglobin: 8.4 g/dL — ABNORMAL LOW (ref 13.0–17.0)
Lymphocytes Relative: 42 %
Lymphs Abs: 1.8 10*3/uL (ref 0.7–4.0)
MCH: 32.4 pg (ref 26.0–34.0)
MCHC: 34.6 g/dL (ref 30.0–36.0)
MCV: 93.8 fL (ref 80.0–100.0)
Monocytes Absolute: 0.4 10*3/uL (ref 0.1–1.0)
Monocytes Relative: 9 %
Neutro Abs: 2 10*3/uL (ref 1.7–7.7)
Neutrophils Relative %: 46 %
Platelets: 292 10*3/uL (ref 150–400)
RBC: 2.59 MIL/uL — ABNORMAL LOW (ref 4.22–5.81)
RDW: 14.8 % (ref 11.5–15.5)
WBC: 4.4 10*3/uL (ref 4.0–10.5)
nRBC: 0 % (ref 0.0–0.2)
nRBC: 0 /100 WBC

## 2019-08-03 LAB — GLUCOSE, CAPILLARY
Glucose-Capillary: 107 mg/dL — ABNORMAL HIGH (ref 70–99)
Glucose-Capillary: 45 mg/dL — ABNORMAL LOW (ref 70–99)
Glucose-Capillary: 68 mg/dL — ABNORMAL LOW (ref 70–99)
Glucose-Capillary: 68 mg/dL — ABNORMAL LOW (ref 70–99)
Glucose-Capillary: 73 mg/dL (ref 70–99)
Glucose-Capillary: 79 mg/dL (ref 70–99)
Glucose-Capillary: 83 mg/dL (ref 70–99)
Glucose-Capillary: 85 mg/dL (ref 70–99)
Glucose-Capillary: 93 mg/dL (ref 70–99)
Glucose-Capillary: 94 mg/dL (ref 70–99)

## 2019-08-03 LAB — RENAL FUNCTION PANEL
Albumin: 3.4 g/dL — ABNORMAL LOW (ref 3.5–5.0)
Anion gap: 8 (ref 5–15)
BUN: 9 mg/dL (ref 8–23)
CO2: 19 mmol/L — ABNORMAL LOW (ref 22–32)
Calcium: 9.1 mg/dL (ref 8.9–10.3)
Chloride: 108 mmol/L (ref 98–111)
Creatinine, Ser: 0.8 mg/dL (ref 0.61–1.24)
GFR calc Af Amer: >60 mL/min (ref >60)
GFR calc non Af Amer: >60 mL/min (ref >60)
Glucose, Bld: 102 mg/dL — ABNORMAL HIGH (ref 70–99)
Phosphorus: 3 mg/dL (ref 2.5–4.6)
Potassium: 3.6 mmol/L (ref 3.5–5.1)
Sodium: 135 mmol/L (ref 135–145)

## 2019-08-03 NOTE — Progress Notes (Signed)
  Speech Language Pathology Treatment: Dysphagia  Patient Details Name: Henry Oconnell MRN: [id number] DOB: 08-29-1951 Today's Date: 08/03/2019 Time: 9357-0177 SLP Time Calculation (min) (ACUTE ONLY): 10 min  Assessment / Plan / Recommendation Clinical Impression  Pt was encountered awake/alert, lying semi-reclined in bed with bilateral mitts and restraints.  Pt was observed to have a wet, gurgling vocal quality upon SLP arrival.  Pt was cued to cough and then swallow, however this was unsuccessful in eliminating wet vocal quality.  Pt was seen with trials of thin liquid via tsp/straw sip and puree.  He exhibited reduced bolus acceptance with trials administered via spoon and he presented with prolonged AP transport with all trials.  Pt reported difficulty swallowing puree trial and he exhibited an immediate cough following cued swallow initiation.  No cough/throat clear was observed following thin liquid trials; however, wet vocal quality was present and pt exhibited an increased RR.  Pt's oral cavity was suctioned following po trials and it was remarkable for trace amounts of puree and mild amounts of suspected secretions.  Following suction and cued cough, no wet vocal quality was observed.  Pt is currently on a full liquid diet per MD order with known risk of aspiration after pt pulled NG tube and Cortrak.  Recommend the following compensatory strategies during po intake: 1) Small bites/sips 2) Sit upright 90 degrees 3) Intermittent throat clear/cough 4) Have oral suction available 5) Remain upright for 20-30 minutes following po intake.     HPI HPI: Mr REISE GLADNEY, 68 y/m, transfered from [facility] for infectious disease input and possible advanced care. PMH: h/o HIV,  non-ST elevation MI, poly substance abuse, VF arrest and recent admission to [facility]. for acute encephalopathy. 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 No previous ST  services.       SLP Plan  Continue with current plan of care       Recommendations  Diet recommendations: (MD ordered full liquid diet with known risk of aspiration ) Medication Administration: Crushed with puree Supervision: Staff to assist with self feeding;Full supervision/cueing for compensatory strategies Compensations: Minimize environmental distractions;Slow rate;Small sips/bites Postural Changes and/or Swallow Maneuvers: Seated upright 90 degrees;Upright 30-60 min after meal                Oral Care Recommendations: Oral care QID;Staff/trained caregiver to provide oral care Follow up Recommendations: Skilled Nursing facility;24 hour supervision/assistance SLP Visit Diagnosis: Dysphagia, unspecified (R13.10) Plan: Continue with current plan of care                     Colin Mulders M.S., [place] Office: [phone]  [place] 08/03/2019, 10:59 AM
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
## 2019-08-03 NOTE — Progress Notes (Signed)
Physical Therapy Treatment Patient Details Name: Henry Oconnell MRN: [id number] DOB: 01-Feb-1951 Today's Date: 08/03/2019    History of Present Illness Henry Oconnell is a 68 y.o. male with history of HIV, recently admitted at [facility] for non-ST elevation MI managed conservatively, history of polysubstance abuse, history of VF arrest was recently admitted at [facility] 3 days ago for acute encephalopathy. MRI brain showed multiple masslike areas with a predilection for gray-white matter junction and the ventricles. 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 started on treatment by infectious disease.      PT Comments    Pt was able to stand multiple times EOB with RW and two person mod assist.  We attempted a few steps forward without success as he was rotated R and trunk was anterior of body, having difficulty progressing feet forward to take a step.  PT will continue to follow acutely for safe mobility progression. He remains appropriate for SNF level rehab.   Follow Up Recommendations  SNF;Supervision/Assistance - 24 hour     Equipment Recommendations  Hospital bed;Wheelchair (measurements PT);Wheelchair cushion (measurements PT);3in1 (PT);Rolling walker with 5" wheels    Recommendations for Other Services   NA     Precautions / Restrictions Precautions Precautions: Fall;Other (comment) Precaution Comments: aspiration (difficulty with salavia and phlem), NGT (pulled out over the weekend), low blood sugar (because he pulled out his feeding tube).    Mobility  Bed Mobility Overal bed mobility: Needs Assistance Bed Mobility: Sit to Supine;Supine to Sit     Supine to sit: Mod assist;HOB elevated Sit to supine: Mod assist;HOB elevated   General bed mobility comments: Mod assist to help bring trunk up to sitting and assist in helping feet back into bed to return to supine.   Transfers Overall transfer level: Needs assistance Equipment used: Rolling walker (2  wheeled) Transfers: Sit to/from Stand Sit to Stand: +2 physical assistance;Mod assist         General transfer comment: Two person mod assist to stand from EOB x 2 to RW.  Assist for hand placement on RW as his visual deficits have him rotated to the right in sitting and standing searching that side for support.  Assist to control descent to sit back down and ensure he sits down onto the bed and not the floor.   Ambulation/Gait             General Gait Details: Unable even with two person assist, trunk shoots forward of body and pt rotates to the right as he attempts to progress feet forward.        Balance Overall balance assessment: Needs assistance Sitting-balance support: Feet supported;Bilateral upper extremity supported Sitting balance-Leahy Scale: Poor Sitting balance - Comments: Min assist EOB due to mild posterior preference.  Postural control: Posterior lean Standing balance support: Bilateral upper extremity supported Standing balance-Leahy Scale: Poor Standing balance comment: mod two person assist with RW in standing, pt rotated to his right with trunk anterior of his feet.                             Cognition Arousal/Alertness: Awake/alert Behavior During Therapy: Impulsive Overall Cognitive Status: Impaired/Different from baseline Area of Impairment: Orientation;Attention;Memory;Following commands;Awareness;Safety/judgement;Problem solving                 Orientation Level: Disoriented to;Place;Time;Situation Current Attention Level: Sustained Memory: Decreased recall of precautions;Decreased short-term memory Following Commands: Follows one step commands inconsistently  Safety/Judgement: Decreased awareness of safety;Decreased awareness of deficits Awareness: Intellectual Problem Solving: Decreased initiation;Requires verbal cues;Difficulty sequencing;Requires tactile cues;Slow processing General Comments: Pt continues to have significant  R gaze, unable to come across midline when asked.  He thinks he is at home in his apartment.  He has significant visual deficits (unable to name how many fingers even when held in his R gaze).  Pt more alert and restless today compared to last session. Continues to have productive cough.              Pertinent Vitals/Pain Pain Assessment: Faces Faces Pain Scale: No hurt           PT Goals (current goals can now be found in the care plan section) Acute Rehab PT Goals Patient Stated Goal: pt did not state Progress towards PT goals: Progressing toward goals    Frequency    Min 2X/week      PT Plan Current plan remains appropriate       AM-PAC PT "6 Clicks" Mobility   Outcome Measure  Help needed turning from your back to your side while in a flat bed without using bedrails?: A Lot Help needed moving from lying on your back to sitting on the side of a flat bed without using bedrails?: A Lot Help needed moving to and from a bed to a chair (including a wheelchair)?: A Lot Help needed standing up from a chair using your arms (e.g., wheelchair or bedside chair)?: A Lot Help needed to walk in hospital room?: Total Help needed climbing 3-5 steps with a railing? : Total 6 Click Score: 10    End of Session Equipment Utilized During Treatment: Gait belt Activity Tolerance: Patient limited by fatigue Patient left: in bed;with call bell/phone within reach;with bed alarm set Nurse Communication: Mobility status PT Visit Diagnosis: Unsteadiness on feet (R26.81);Muscle weakness (generalized) (M62.81);Difficulty in walking, not elsewhere classified (R26.2)     Time: 6761-9509 PT Time Calculation (min) (ACUTE ONLY): 20 min  Charges:  $Therapeutic Activity: 8-22 mins                    Corinna Capra, PT, DPT  Acute Rehabilitation [phone] pager #[phone] office  @ [place]: [phone]   08/03/2019, 12:44 PM

## 2019-08-03 NOTE — Progress Notes (Addendum)
Hospitalist progress note  If 7PM-7AM,  night-coverage-look on AMION -prefer pages-not epic chat,please  Henry Oconnell  ATF:[id number] DOB: May 22, 1951 DOA: 07/22/2019 PCP: Patient, No Pcp Per  Narrative:  68 year old male COPD/asthma VF arrest 2018 hep C, history of IV drug use, HIV noncompliant with antiretrovirals for over 7 months, NSTEMI Rx [facility]F you 07/05/19/2020-10/14 2020-also prior heart attack 5 to 6 months prior to the symptoms in hospital for 2 weeks had Zio patch at that time was treated with heparin loaded with aspirin and told to continue meds was not placed on other meds on discharge because of poor compliance borderline bradycardia Presented to [facility] ~ 10/25 for acute encephalopathy-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 but also aspiration continue atovaquone and sulfadiazine-pyrimethamine started 11/3--he has no tube in place currrently-see below Needs MRI brain as per ID on 11/13 Probable aspiration-cortrak pulled out NG pulled out and not replaced 11/7 Treat pneumonia with Flagyl ending on 11/8 Allow clear liquids-follow speech therapy recs once possible I do not think he is receiving enough calories-d/w SLP who rec's puree + thin liquids but with caveat of likely aspiration again Will ask for Cortrak--he is amenable to the same based on my discussion HIV Holding Biktarvy at this time VL 90-deferring care to ID Prior concerns for alcoholism Discontinued  CIWA no further metabolic encephalopathy Untreated hepatitis C? Will need outpatient management if he survives this admission RPR positive Defer to ID Dilutional anemia versus acute blood loss anemia Monitor trends-between 7 and 8 typically Recent NSTEMI in the setting of another subacute 1 about 6 months previously Remote history of VF arrest 2018 Continue Plavix aspirin statin at this time  DVT prophylaxis: Lovenox  Code Status:   Full   Family Communication:   Rosana Hoes on 11/8 and updated Disposition Plan: inpatient--need SNF hosp bed and 3 in 1  Consultants:   Neurosurgery  ID   Palliative Procedures:   Mri \  CT scan Antimicrobials:   Atavaquone  Pyrimethamine  Sulfadiazine  Vancomycin    Subjective:  awakened but s little more sleepy than yesterday Coherent to some degree    Objective: Vitals:   08/02/19 2255 08/03/19 0314 08/03/19 0757 08/03/19 1209  BP: (!) 152/88 126/87 (!) 146/84 (!) 143/80  Pulse: 75 70 67 69  Resp: 16 16 20 20   Temp: 98.3 F (36.8 C)  (!) 97.5 F (36.4 C) 97.6 F (36.4 C)  TempSrc: Oral Oral Oral   SpO2: 97% 97% 95% 93%    Intake/Output Summary (Last 24 hours) at 08/03/2019 1259 Last data filed at 08/03/2019 1006 Gross per 24 hour  Intake 400 ml  Output 1850 ml  Net -1450 ml   There were no vitals filed for this visit.  Examination:  EOMI frail cachectic poor dentition neck soft supple cta b without added sounds abd soft nt nd no rebound no gaurd Power overall is intact moving 4 limbs but sometimes is restless Cannot appreciate sacral decubitus no lower extremity edema Rash lower extremities   Data Reviewed: I have personally reviewed  following labs and imaging studies CBC: Recent Labs  Lab 07/28/19 1112 07/29/19 0300 07/30/19 0813 07/31/19 0314 08/01/19 0256 08/03/19 1024  WBC 6.0 4.6 4.8 5.4 5.2 4.4  NEUTROABS 4.0 3.3  --  2.7 2.3 2.0  HGB 7.8* 7.0* 7.6* 8.0* 8.1* 8.4*  HCT 23.6* 21.0*  22.7* 24.1* 23.9* 24.3*  MCV 97.1 94.2 95.4 97.2 93.4 93.8  PLT 171 174 194 228 212 292   Basic Metabolic Panel: Recent Labs  Lab 07/29/19 0300 07/30/19 0315 07/31/19 0314 08/01/19 0256 08/03/19 1024  NA 136 135 135 135 135  K 4.2 4.3 3.8 3.6 3.6  CL 107 106 105 106 108  CO2 22 20* 20* 20* 19*  GLUCOSE 119* 90 81 83 102*  BUN 14 13 14 13 9   CREATININE 0.74 0.87 0.86 0.90 0.80  CALCIUM 8.3* 8.9 9.0 9.1 9.1  PHOS  --   --   --  4.4 3.0   GFR: CrCl cannot be calculated (Unknown ideal weight.). Liver Function Tests: Recent Labs  Lab 07/30/19 0315 08/01/19 0256 08/03/19 1024  AST 36  --   --   ALT 20  --   --   ALKPHOS 73  --   --   BILITOT 0.3  --   --   PROT 7.0  --   --   ALBUMIN 3.2* 3.3* 3.4*   No results for input(s): LIPASE, AMYLASE in the last 168 hours. No results for input(s): AMMONIA in the last 168 hours. Coagulation Profile: Recent Labs  Lab 07/30/19 0315  INR 1.1   Cardiac Enzymes: Radiology Studies: Reviewed images personally in health database  Scheduled Meds: . aspirin  81 mg Oral Daily  . clopidogrel  75 mg Oral Daily  . dapsone  100 mg Oral Daily  . enoxaparin (LOVENOX) injection  40 mg Subcutaneous Q24H  . feeding supplement (OSMOLITE 1.2 CAL)  1,000 mL Per Tube Q24H  . influenza vaccine adjuvanted  0.5 mL Intramuscular Tomorrow-1000  . insulin aspart  0-9 Units Subcutaneous Q4H  . Pyrimethamine-Leucovorin  50 mg Oral Daily  . sulfaDIAZINE  1,000 mg Oral Q6H  . thiamine  100 mg Oral Daily   Continuous Infusions: . dextrose 75 mL/hr at 08/03/19 0916    LOS: 12 days   Time spent: 40 13/09/20, MD [facility]  08/03/2019, 12:59 PM

## 2019-08-04 DIAGNOSIS — Z79899 Other long term (current) drug therapy: Secondary | ICD-10-CM

## 2019-08-04 LAB — GLUCOSE, CAPILLARY
Glucose-Capillary: 68 mg/dL — ABNORMAL LOW (ref 70–99)
Glucose-Capillary: 78 mg/dL (ref 70–99)
Glucose-Capillary: 77 mg/dL (ref 70–99)
Glucose-Capillary: 72 mg/dL (ref 70–99)
Glucose-Capillary: 93 mg/dL (ref 70–99)

## 2019-08-04 LAB — VITAMIN B1: Vitamin B1 (Thiamine): 38.7 nmol/L — ABNORMAL LOW (ref 66.5–200.0)

## 2019-08-04 LAB — OCCULT BLOOD X 1 CARD TO LAB, STOOL: Fecal Occult Bld: POSITIVE — AB

## 2019-08-04 MED ORDER — GLUCAGON HCL RDNA (DIAGNOSTIC) 1 MG IJ SOLR
INTRAMUSCULAR | Status: AC
  Administered 2019-08-04: 1 mg
  Filled 2019-08-04: qty 1

## 2019-08-04 MED ORDER — QUETIAPINE FUMARATE 25 MG PO TABS
25.0000 mg | ORAL_TABLET | Freq: Two times a day (BID) | ORAL | Status: DC
  Administered 2019-08-04 – 2019-08-08 (×7): 25 mg via ORAL
  Filled 2019-08-04 (×7): qty 1

## 2019-08-04 MED ORDER — ENSURE ENLIVE PO LIQD
237.0000 mL | Freq: Two times a day (BID) | ORAL | Status: DC
  Administered 2019-08-04 – 2019-08-08 (×7): 237 mL via ORAL

## 2019-08-04 NOTE — Progress Notes (Signed)
Nutrition Follow-up  DOCUMENTATION CODES:   Not applicable  INTERVENTION:   -D/C Osmolite 1.2   Ensure Enlive po BID, each supplement provides 350 kcal and 20 grams of protein  Magic cup TID with meals, each supplement provides 290 kcal and 9 grams of protein  NUTRITION DIAGNOSIS:   Increased nutrient needs related to chronic illness(HIV) as evidenced by estimated needs   Ongoing  GOAL:   Patient will meet greater than or equal to 90% of their needs;   Addressed via TF  MONITOR:   PO intake, Supplement acceptance, Skin, Weight trends, Labs, I & O's  REASON FOR ASSESSMENT:   Consult Enteral/tube feeding initiation and management  ASSESSMENT:  RD working remotely.  68 yo gentleman with history of HIV, history of recent NSTEMI, history of V fib arrest, poly substance use, admitted with altered mental status, has been transferred from [facility] 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  10/30-Positive for toxoplasmosis meningoencephalitis  10/31 NGT; initiated TF 11/3 CXR- cardiomegaly, bilateral pleural effusion 11/4 TF stopped d/t aspiration concerns  Spoke with RN who reports pt pulled Cortrak despite having restraints. Per Palliative's note brother does not wish to have tube placed or artifical feeding.  RD to provide supplements for comfort. CBGs continue to be low likely due to poor PO intake. Pt currently on D5. Will continue to monitor.    Admission weight: not obtained   I/O: +356 ml since admit UOP: 550 ml x 24 hrs   Drips: D5 @ 75 ml/hr  Medications: SS novolog, thiamine  Labs: CBG 45-78  Diet Order:   Diet Order            Diet full liquid Room service appropriate? Yes; Fluid consistency: Thin  Diet effective now              EDUCATION NEEDS:   Not appropriate for education at this time  Skin:  Skin Assessment: Reviewed RN Assessment  Last BM:  11/9  Height:   Ht Readings from Last 1 Encounters:  No data  found for Ht    Weight:   Wt Readings from Last 1 Encounters:  No data found for Wt    Ideal Body Weight:     BMI:  There is no height or weight on file to calculate BMI.  Estimated Nutritional Needs:   Kcal:  2000-2200  Protein:  100-110 grams  Fluid:  >/= 2 L/day  Mariana Single RD, LDN Clinical Nutrition Pager # - [phone]

## 2019-08-04 NOTE — Progress Notes (Signed)
Pt CBG 45. This nurse administered 1mg  Glucagen. Will reassess CBG. Will continue to monitor.

## 2019-08-04 NOTE — Progress Notes (Signed)
Pt CBG upon re-check was 68. This nurse administered another 1mg  Glucagen. Will reassess CBG. Will continue to monitor.

## 2019-08-04 NOTE — Progress Notes (Signed)
Most recent CBG 78, following additional 1mg  dose of Glucagen. Will continue to monitor.

## 2019-08-04 NOTE — Progress Notes (Signed)
Inpatient Diabetes Program Recommendations  AACE/ADA: New Consensus Statement on Inpatient Glycemic Control (2015)  Target Ranges:  Prepandial:   less than 140 mg/dL      Peak postprandial:   less than 180 mg/dL (1-2 hours)      Critically ill patients:  140 - 180 mg/dL   Lab Results  Component Value Date   GLUCAP 77 08/04/2019   HGBA1C 5.2 07/27/2019    Review of Glycemic Control  Results for HERSEY, MACLELLAN (MRN [id number]) as of 08/04/2019 10:20  Ref. Range 08/03/2019 19:41 08/03/2019 23:43 08/04/2019 01:17 08/04/2019 03:40 08/04/2019 07:36  Glucose-Capillary Latest Ref Range: 70 - 99 mg/dL 79 45 (L) Recieved Glucagon 48m  68 (L) Received Glucagon 1mg  78 77    Diabetes history: none Outpatient Diabetes medications: none Current orders for Inpatient glycemic control: Novolog 0-9 Q4hrs  Inpatient Diabetes Program Recommendations:     -CBG's running low with poor PO intake.  Had 2 lows 45/68-received Glucagon 1mg  with each low. -Please consider discontinuing correction scale Novolog 0-9 Q4 hrs  Thanks, Geoffry Paradise, RN, BSN Diabetes Coordinator [phone] (8a-5p)

## 2019-08-04 NOTE — Progress Notes (Signed)
Hospitalist progress note  If 7PM-7AM,  night-coverage-look on AMION -prefer pages-not epic chat,please  Henry Oconnell  HYI:[id number] DOB: 05-10-1951 DOA: 07/22/2019 PCP: Patient, No Pcp Per  Narrative:  68 year old male COPD/asthma VF arrest 2018 hep C, history of IV drug use, HIV noncompliant with antiretrovirals for over 7 months, NSTEMI Rx WF you 07/05/19/2020-10/14 2020-also prior heart attack 5 to 6 months prior to the symptoms in hospital for 2 weeks had Zio patch at that time was treated with heparin loaded with aspirin and told to continue meds was not placed on other meds on discharge because of poor compliance borderline bradycardia Presented to [facility] ~ 10/25 for acute encephalopathy-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 Will need outpatient management if he survives this admission RPR positive Defer to ID Dilutional anemia  versus acute blood loss anemia Monitor trends-between 7 and 8 typically FOBT is + Very poor overall candidate for procedures--and not symptomatic bleeding--see below-need DAPT Recent NSTEMI in the setting of another subacute 1 about 6 months previously Remote history of VF arrest 2018 Continue Plavix aspirin statin at this time  DVT prophylaxis: Lovenox  Code Status:   Full   Family Communication:   Rosana Hoes on 11/8 and updated Disposition Plan: inpatient--need SNF hosp bed and 3 in 1 Await [placement -difficult dispo per CSW  Consultants:   Neurosurgery  ID   Palliative Procedures:   Mri \  CT scan Antimicrobials:   Atavaquone  Pyrimethamine  Sulfadiazine  Vancomycin    Subjective:  A little jumpy today and was somewhat surprised when visited otherwise is well In restraints as does pull out IV and other devices  Objective: Vitals:   08/03/19 2330 08/04/19 0318 08/04/19 0731 08/04/19 1125  BP: (!) 149/85 (!) 156/80 132/77 129/73  Pulse: 77 79 81 88  Resp: 20 20 16 16   Temp:   (!) 97.4 F (36.3 C) 97.9 F (36.6 C)  TempSrc: Oral Oral Oral Oral  SpO2: 100% 98%      Intake/Output Summary (Last 24 hours) at 08/04/2019 1313 Last data filed at 08/03/2019 1631 Gross per 24 hour  Intake 2406.63 ml  Output -  Net 2406.63 ml   There were no vitals filed for this visit.  Examination:  EOMIcachectic poor dentition neck soft supple cta b without added sounds abd soft nt nd no rebound no gaurd Power overall is intact moving 4 limbs but restless at times and in restraints   Data Reviewed: I have personally reviewed following labs and imaging studies CBC: Recent Labs  Lab 07/29/19 0300 07/30/19 0813 07/31/19 0314 08/01/19 0256 08/03/19  1024  WBC 4.6 4.8 5.4 5.2 4.4  NEUTROABS 3.3  --  2.7 2.3 2.0  HGB 7.0* 7.6* 8.0* 8.1* 8.4*  HCT 21.0* 22.7* 24.1* 23.9* 24.3*  MCV 94.2 95.4 97.2 93.4 93.8  PLT 174 194 228 212 292   Basic Metabolic  Panel: Recent Labs  Lab 07/29/19 0300 07/30/19 0315 07/31/19 0314 08/01/19 0256 08/03/19 1024  NA 136 135 135 135 135  K 4.2 4.3 3.8 3.6 3.6  CL 107 106 105 106 108  CO2 22 20* 20* 20* 19*  GLUCOSE 119* 90 81 83 102*  BUN 14 13 14 13 9   CREATININE 0.74 0.87 0.86 0.90 0.80  CALCIUM 8.3* 8.9 9.0 9.1 9.1  PHOS  --   --   --  4.4 3.0   GFR: CrCl cannot be calculated (Unknown ideal weight.). Liver Function Tests: Recent Labs  Lab 07/30/19 0315 08/01/19 0256 08/03/19 1024  AST 36  --   --   ALT 20  --   --   ALKPHOS 73  --   --   BILITOT 0.3  --   --   PROT 7.0  --   --   ALBUMIN 3.2* 3.3* 3.4*   No results for input(s): LIPASE, AMYLASE in the last 168 hours. No results for input(s): AMMONIA in the last 168 hours. Coagulation Profile: Recent Labs  Lab 07/30/19 0315  INR 1.1   Cardiac Enzymes: Radiology Studies: Reviewed images personally in health database  Scheduled Meds: . aspirin  81 mg Oral Daily  . clopidogrel  75 mg Oral Daily  . dapsone  100 mg Oral Daily  . enoxaparin (LOVENOX) injection  40 mg Subcutaneous Q24H  . feeding supplement (ENSURE ENLIVE)  237 mL Oral BID BM  . influenza vaccine adjuvanted  0.5 mL Intramuscular Tomorrow-1000  . insulin aspart  0-9 Units Subcutaneous Q4H  . Pyrimethamine-Leucovorin  50 mg Oral Daily  . sulfaDIAZINE  1,000 mg Oral Q6H  . thiamine  100 mg Oral Daily   Continuous Infusions: . dextrose 75 mL/hr at 08/04/19 0110    LOS: 13 days   Time spent: 40 13/10/20, MD [facility]  08/04/2019, 1:13 PM

## 2019-08-04 NOTE — Progress Notes (Signed)
[facility]   Reason for visit: Follow up on CNS toxoplasmosis  Interval History: followed by palliative medicine and plan in place.  Poor prognosis.  No acute events.  Patient otherwise non-verbal.     Physical Exam: Constitutional:  Vitals:   08/04/19 0318 08/04/19 0731  BP: (!) 156/80 132/77  Pulse: 79 81  Resp: 20 16  Temp:  (!) 97.4 F (36.3 C)  SpO2: 98%    patient appears in NAD Respiratory: Normal respiratory effort; CTA B Cardiovascular: RRR GI: soft, nt, nd  Review of Systems: Unable to be assessed due to mental status  Lab Results  Component Value Date   WBC 4.4 08/03/2019   HGB 8.4 (L) 08/03/2019   HCT 24.3 (L) 08/03/2019   MCV 93.8 08/03/2019   PLT 292 08/03/2019    Lab Results  Component Value Date   CREATININE 0.80 08/03/2019   BUN 9 08/03/2019   NA 135 08/03/2019   K 3.6 08/03/2019   CL 108 08/03/2019   CO2 19 (L) 08/03/2019    Lab Results  Component Value Date   ALT 20 07/30/2019   AST 36 07/30/2019   ALKPHOS 73 07/30/2019     Microbiology: Recent Results (from the past 240 hour(s))  Culture, blood (routine x 2)     Status: None   Collection Time: 07/28/19  4:10 PM   Specimen: BLOOD  Result Value Ref Range Status   Specimen Description BLOOD LEFT ANTECUBITAL  Final   Special Requests   Final    BOTTLES DRAWN AEROBIC ONLY Blood Culture adequate volume   Culture   Final    NO GROWTH 5 DAYS Performed at [facility], [address] [place], [place] [address]    Report Status 08/02/2019 FINAL  Final  Culture, blood (routine x 2)     Status: None   Collection Time: 07/28/19  4:15 PM   Specimen: BLOOD  Result Value Ref Range Status   Specimen Description BLOOD LEFT ANTECUBITAL  Final   Special Requests   Final    BOTTLES DRAWN AEROBIC ONLY Blood Culture adequate volume   Culture   Final    NO GROWTH 5 DAYS Performed at [facility], [address] [place], [place] [address]    Report  Status 08/02/2019 FINAL  Final  MRSA PCR Screening     Status: None   Collection Time: 07/28/19  4:22 PM   Specimen: Nasal Mucosa; Nasopharyngeal  Result Value Ref Range Status   MRSA by PCR NEGATIVE NEGATIVE Final    Comment:        The GeneXpert MRSA Assay (FDA approved for NASAL specimens only), is one component of a comprehensive MRSA colonization surveillance program. It is not intended to diagnose MRSA infection nor to guide or monitor treatment for MRSA infections. Performed at [facility], [address] [place], [place] [address]     Impression/Plan:  1. 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  Poor overall prognosis.  No changes.  2.  Aspiration - completed flagyl.  Will be an ongoing issue.  No antibiotic treatment indicated at this time.  3.  HIV/AIDS - If desired, his Biktarvy can be started if he is able to take it daily. Will defer to palliative medicine if indicated.  I do not feel it will add much benefit to comfort for him.    I will sign off, call  with any questions.

## 2019-08-05 LAB — GLUCOSE, CAPILLARY
Glucose-Capillary: 64 mg/dL — ABNORMAL LOW (ref 70–99)
Glucose-Capillary: 66 mg/dL — ABNORMAL LOW (ref 70–99)
Glucose-Capillary: 69 mg/dL — ABNORMAL LOW (ref 70–99)
Glucose-Capillary: 77 mg/dL (ref 70–99)
Glucose-Capillary: 82 mg/dL (ref 70–99)
Glucose-Capillary: 86 mg/dL (ref 70–99)
Glucose-Capillary: 90 mg/dL (ref 70–99)
Glucose-Capillary: 91 mg/dL (ref 70–99)

## 2019-08-05 MED ORDER — GLUCAGON HCL RDNA (DIAGNOSTIC) 1 MG IJ SOLR
INTRAMUSCULAR | Status: AC
  Administered 2019-08-06: 04:00:00
  Filled 2019-08-05: qty 1

## 2019-08-05 MED ORDER — GLUCAGON HCL RDNA (DIAGNOSTIC) 1 MG IJ SOLR
INTRAMUSCULAR | Status: AC
  Filled 2019-08-05: qty 1

## 2019-08-05 NOTE — Progress Notes (Signed)
Pt CBG 64. Gave pt 2 fl oz (80mL) chocolate ice cream PO, 8g carbs. Will recheck CBG. Will continue to monitor.

## 2019-08-05 NOTE — Progress Notes (Signed)
CBG 66, pt is not eating,apple sauce given, will recheck CBG and administer medication as ordered if required

## 2019-08-05 NOTE — NC FL2 (Signed)
[place] MEDICAID FL2 LEVEL OF CARE SCREENING TOOL     IDENTIFICATION  Patient Name: Henry Oconnell Birthdate: [birth year] Sex: male Admission Date (Current Location): 07/22/2019  [facility] and [place] Number:  Producer, television/film/video and Address:  [facility], [address] [place], [place] [address]      Provider Number: [id number]  Attending Physician Name and Address:  Teddy Spike, DO  Relative Name and Phone Number:  Amahd Morino, Relative, [phone]    Current Level of Care: Hospital Recommended Level of Care: Skilled Nursing Facility Prior Approval Number:    Date Approved/Denied:   PASRR Number: [id number]  Discharge Plan: SNF    Current Diagnoses: Patient Active Problem List   Diagnosis Date Noted  . Dysphagia   . 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 Palliative care by specialist   . Goals of care, counseling/discussion   . Acute encephalopathy 07/23/2019  . Brain mass 07/23/2019  . Coronary artery disease involving native coronary artery of native heart without angina pectoris   . Cocaine use disorder, moderate, dependence (HCC) 09/01/2016  . Major depressive disorder, recurrent severe without psychotic features (HCC) 08/31/2016  . Alcohol use disorder, moderate, dependence (HCC) 08/31/2016  . Suicidal ideation 08/31/2016  . Noncompliance 08/31/2016  . Tobacco use disorder 08/31/2016  . Human immunodeficiency virus (HIV) disease (HCC) 08/31/2016  . Hepatitis C 08/31/2016  . Hepatitis B 08/31/2016    Orientation RESPIRATION BLADDER Height & Weight     Self  Normal Incontinent, External catheter Weight:   Height:     BEHAVIORAL SYMPTOMS/MOOD NEUROLOGICAL BOWEL NUTRITION STATUS      Incontinent Diet(on full liquid diet)  AMBULATORY STATUS COMMUNICATION OF NEEDS Skin   Total Care Verbally Normal(dry skin)                       Personal Care Assistance Level of Assistance  Bathing, Feeding, Dressing,  Total care Bathing Assistance: Maximum assistance Feeding assistance: Maximum assistance Dressing Assistance: Maximum assistance Total Care Assistance: Maximum assistance   Functional Limitations Info  Sight, Speech, Hearing Sight Info: Adequate Hearing Info: Adequate Speech Info: Adequate    SPECIAL CARE FACTORS FREQUENCY  PT (By licensed PT), OT (By licensed OT), Speech therapy     PT Frequency: 5x/wk OT Frequency: 5x/wk     Speech Therapy Frequency: 5x/wk      Contractures Contractures Info: Not present    Additional Factors Info  Code Status, Allergies, Psychotropic, Insulin Sliding Scale Code Status Info: DNR Allergies Info: Sulfamethoxazole Micro (Sulfamethoxazole), Trimethoprim Psychotropic Info: seroquel 25 mg BID/ Dapsone 100 mg daily Insulin Sliding Scale Info: insulin aspart novolog 0-9 units every 4 hours       Current Medications (08/05/2019):  This is the current hospital active medication list Current Facility-Administered Medications  Medication Dose Route Frequency Provider Last Rate Last Dose  . acetaminophen (TYLENOL) tablet 650 mg  650 mg Oral Q6H PRN Rhetta Mura, MD       Or  . acetaminophen (TYLENOL) suppository 650 mg  650 mg Rectal Q6H PRN Rhetta Mura, MD      . albuterol (PROVENTIL) (2.5 MG/3ML) 0.083% nebulizer solution 2.5 mg  2.5 mg Nebulization Q6H PRN Dow Adolph N, DO   2.5 mg at 07/29/19 2042  . aspirin chewable tablet 81 mg  81 mg Oral Daily Rhetta Mura, MD   81 mg at 08/05/19 1011  . clopidogrel (PLAVIX) tablet 75 mg  75 mg Oral Daily  Nita Sells, MD   75 mg at 08/05/19 1011  . dapsone tablet 100 mg  100 mg Oral Daily Nita Sells, MD   100 mg at 08/05/19 1011  . dextrose 5 % solution   Intravenous Continuous Nita Sells, MD 75 mL/hr at 08/04/19 0110    . enoxaparin (LOVENOX) injection 40 mg  40 mg Subcutaneous Q24H Hall, Carole N, DO   40 mg at 08/05/19 1013  . feeding supplement  (ENSURE ENLIVE) (ENSURE ENLIVE) liquid 237 mL  237 mL Oral BID BM Nita Sells, MD   237 mL at 08/05/19 1015  . glucagon (human recombinant) (GLUCAGEN) 1 MG injection           . hydrALAZINE (APRESOLINE) injection 5 mg  5 mg Intravenous Q4H PRN Rise Patience, MD      . influenza vaccine adjuvanted (FLUAD) injection 0.5 mL  0.5 mL Intramuscular Tomorrow-1000 Hall, Carole N, DO      . insulin aspart (novoLOG) injection 0-9 Units  0-9 Units Subcutaneous Q4H Kayleen Memos, DO   2 Units at 08/02/19 1228  . ondansetron (ZOFRAN) tablet 4 mg  4 mg Oral Q6H PRN Nita Sells, MD       Or  . ondansetron (ZOFRAN) injection 4 mg  4 mg Intravenous Q6H PRN Nita Sells, MD      . Pyrimethamine-Leucovorin 50-25 MG CAPS 50 mg  50 mg Oral Daily Bodenheimer, Charles A, NP   50 mg at 08/04/19 2235  . QUEtiapine (SEROQUEL) tablet 25 mg  25 mg Oral BID Nita Sells, MD   25 mg at 08/05/19 1011  . sulfaDIAZINE tablet 1,000 mg  1,000 mg Oral Q6H Bodenheimer, Charles A, NP   1,000 mg at 08/05/19 1011  . thiamine (VITAMIN B-1) tablet 100 mg  100 mg Oral Daily Nita Sells, MD   100 mg at 08/05/19 1011     Discharge Medications: Please see discharge summary for a list of discharge medications.  Relevant Imaging Results:  Relevant Lab Results:   Additional Information SSN: [id number]  Pollie Friar, RN

## 2019-08-05 NOTE — Progress Notes (Signed)
Palliative Medicine RN Note: Following up on HCPOA completion order. No note in chart from [facility] that it has been addressed, and order has fallen off. Re-entered order and called [facility] to make sure they are aware.  Marjie Skiff Adarius Tigges, RN, BSN, [facility] Palliative Medicine Team 08/05/2019 1:15 PM Office [phone]

## 2019-08-05 NOTE — Progress Notes (Signed)
Current pt CBG 82. New IV in place. Will continue to monitor.

## 2019-08-05 NOTE — Progress Notes (Signed)
Chaplain responded to consult for HCPOA to be made pt's brother. Chaplain is unable to complete the AD paperwork at this time because Layson is not alert and oriented. The pt must be alert and oriented before he is legally able to sign the HCPOA document. Chaplain remains available for support as needed.   Chaplain Resident, Evelene Croon, M. Div

## 2019-08-05 NOTE — Progress Notes (Signed)
Occupational Therapy Treatment Patient Details Name: Henry Oconnell MRN: [id number] DOB: [birth year] Today's Date: 08/05/2019    History of present illness Henry Oconnell is a 68 y.o. male with history of HIV, recently admitted at [facility] for non-ST elevation MI managed conservatively, history of polysubstance abuse, history of VF arrest was recently admitted at [facility] 3 days ago for acute encephalopathy. MRI brain showed multiple masslike areas with a predilection for gray-white matter junction and the ventricles. 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 started on treatment by infectious disease.     OT comments  Pt received in bed, agreeable to OT/PT session. Pt required modA for bed mobility and minguard for stability sitting EOB. Pt required modA+2 for stand-pivot to recliner. Following transfer pt reported dizziness, BP 97/66, Hr 83bpm, SpO2 78% RA. RN made aware. Therapist provided 1lnc supplemental O2, SpO2 increased to 90-93%. RN requested pt on 2Lnc. Upon return to bed BP 111/69, HR 85, SpO2 100%. Due to decreased activity tolerance pt currently requires maxA-totalA for ADL. Pt continues to demonstrate strong R gaze preference impacting safety and independence with ADL and functional mobility. Pt will continue to benefit from skilled OT services to maximize safety and independence with ADL/IADL and functional mobility. Will continue to follow acutely and progress as tolerated.    Follow Up Recommendations  SNF;Supervision/Assistance - 24 hour    Equipment Recommendations  3 in 1 bedside commode    Recommendations for Other Services      Precautions / Restrictions Precautions Precautions: Fall;Other (comment) Precaution Comments: aspiration (difficulty with salavia and phlem), NGT (pulled out over the weekend), low blood sugar (because he pulled out his feeding tube). Restrictions Weight Bearing Restrictions: No       Mobility Bed Mobility Overal bed  mobility: Needs Assistance Bed Mobility: Sit to Supine     Supine to sit: Mod assist;HOB elevated;+2 for physical assistance Sit to supine: Mod assist;HOB elevated;+2 for physical assistance;Max assist   General bed mobility comments: mod assist for BLE but min guard to upright trunk with supine>sit, mod/max assist +2 for sit>supine  Transfers Overall transfer level: Needs assistance Equipment used: Rolling walker (2 wheeled) Transfers: Sit to/from [employer] Sit to Stand: +2 physical assistance;Mod assist;+2 safety/equipment Stand pivot transfers: Max assist;+2 physical assistance;+2 safety/equipment       General transfer comment: elevated EOB, max cuing for hand placement but pt continues to pull up on RW with BUE, cuing & assist with RW management for stand pivot & assistance with eccentric lowering into recliner    Balance Overall balance assessment: Needs assistance Sitting-balance support: Feet supported;Bilateral upper extremity supported Sitting balance-Leahy Scale: Fair Sitting balance - Comments: close supervision for static sitting balanc EOB Postural control: Posterior lean Standing balance support: Bilateral upper extremity supported Standing balance-Leahy Scale: Poor Standing balance comment: mod assist +2 for sit<>stand & stand pivot with BUE support on RW                           ADL either performed or assessed with clinical judgement   ADL Overall ADL's : Needs assistance/impaired Eating/Feeding: Total assistance   Grooming: Maximal assistance   Upper Body Bathing: Total assistance   Lower Body Bathing: Total assistance   Upper Body Dressing : Total assistance   Lower Body Dressing: Total assistance   Toilet Transfer: Maximal assistance;+2 for safety/equipment;Stand-pivot Toilet Transfer Details (indicate cue type and reason): stand pivot from/to EOB and recliner  Toileting- Clothing Manipulation and Hygiene: Maximal  assistance;+2 for physical assistance;+2 for safety/equipment       Functional mobility during ADLs: Maximal assistance;+2 for physical assistance;+2 for safety/equipment General ADL Comments: pt with decreased activity tolerance this session     Vision   Vision Assessment?: Vision impaired- to be further tested in functional context Additional Comments: difficult to assess;pt remaines fixated R gaze, with intermittent occular ROM in neutral, unable to cross midline;   Perception     Praxis      Cognition Arousal/Alertness: Lethargic(towards end of session) Behavior During Therapy: Flat affect Overall Cognitive Status: Impaired/Different from baseline Area of Impairment: Orientation;Attention;Memory;Following commands;Awareness;Safety/judgement;Problem solving                 Orientation Level: Disoriented to;Place;Time;Situation Current Attention Level: Sustained Memory: Decreased recall of precautions;Decreased short-term memory Following Commands: Follows one step commands inconsistently;Follows one step commands with increased time Safety/Judgement: Decreased awareness of safety;Decreased awareness of deficits Awareness: Intellectual Problem Solving: Decreased initiation;Requires verbal cues;Difficulty sequencing;Requires tactile cues;Slow processing General Comments: Pt continues to have significant R gaze, unable to track to midline even when head is assisted with turning L        Exercises     Shoulder Instructions       General Comments RN aware of vitals    Pertinent Vitals/ Pain       Pain Assessment: Faces Faces Pain Scale: Hurts a little bit Pain Descriptors / Indicators: Grimacing Pain Intervention(s): Limited activity within patient's tolerance;Monitored during session  Home Living                                          Prior Functioning/Environment              Frequency  Min 2X/week        Progress Toward  Goals  OT Goals(current goals can now be found in the care plan section)  Progress towards OT goals: Not progressing toward goals - comment(per RN poor p/o;)  Acute Rehab OT Goals Patient Stated Goal: pt did not state OT Goal Formulation: Patient unable to participate in goal setting Time For Goal Achievement: 08/10/19 Potential to Achieve Goals: Fair ADL Goals Pt Will Perform Eating: with supervision Pt Will Perform Grooming: with supervision Pt Will Transfer to Toilet: with min guard assist;stand pivot transfer Additional ADL Goal #1: Pt will follow 2 step commands consistently. Additional ADL Goal #2: Pt will attend to left side environment with min vc.  Plan Discharge plan remains appropriate    Co-evaluation    PT/OT/SLP Co-Evaluation/Treatment: Yes Reason for Co-Treatment: Necessary to address cognition/behavior during functional activity;For patient/therapist safety;To address functional/ADL transfers PT goals addressed during session: Mobility/safety with mobility;Balance OT goals addressed during session: ADL's and self-care      AM-PAC OT "6 Clicks" Daily Activity     Outcome Measure   Help from another person eating meals?: Total Help from another person taking care of personal grooming?: Total Help from another person toileting, which includes using toliet, bedpan, or urinal?: A Lot Help from another person bathing (including washing, rinsing, drying)?: Total Help from another person to put on and taking off regular upper body clothing?: Total Help from another person to put on and taking off regular lower body clothing?: Total 6 Click Score: 7    End of Session Equipment Utilized During Treatment: Rolling walker  OT Visit  Diagnosis: Unsteadiness on feet (R26.81);Other abnormalities of gait and mobility (R26.89);Muscle weakness (generalized) (M62.81);Low vision, both eyes (H54.2);Feeding difficulties (R63.3);Other symptoms and signs involving cognitive  function;Pain   Activity Tolerance Patient tolerated treatment well   Patient Left with call bell/phone within reach;with restraints reapplied;with nursing/sitter in room;in bed;with bed alarm set   Nurse Communication Mobility status(vitals)        Time: 8032-1224 OT Time Calculation (min): 37 min  Charges: OT General Charges $OT Visit: 1 Visit OT Treatments $Self Care/Home Management : 8-22 mins  Dorinda Hill OTR/L [place] Office: [place] 08/05/2019, 12:44 PM

## 2019-08-05 NOTE — Progress Notes (Signed)
[name]  PROGRESS NOTE    Henry Oconnell  OVF:[id number] DOB: 01/26/1951 DOA: 07/22/2019 PCP: Patient, No Pcp Per   Brief Narrative:   68 year old male COPD/asthma VF arrest 2018 hep C, history of IV drug use, HIV noncompliant with antiretrovirals for over 7 months, NSTEMI Rx WF you 07/05/19/2020-10/14 2020-also prior heart attack 5 to 6 months prior to the symptoms in hospital for 2 weeks had Zio patch at that time was treated with heparin loaded with aspirin and told to continue meds was not placed on other meds on discharge because of poor compliance borderline bradycardia Presented to [facility] ~ 10/25 for acute encephalopathy-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 Infectious disease as well neurosurgery were consulted given areas in the brain showing these lesions--his serologies show toxoplasmosis and he was started on definitive treatment for the same It was felt that he became more somnolent and was starting to aspirate on 11/3 and antibiotics were adjusted to Flagyl on 11/5 and palliative care was reconsulted regarding plan of care  11/11: Hypoglycemic ON. PO intake poor. Working on placement.   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[place]) Active Problems:   Human immunodeficiency virus (HIV) disease ([place])   Hepatitis C   Acute encephalopathy   Brain mass   Coronary artery disease involving native coronary artery of native heart without angina pectoris   Palliative care by specialist   Goals of care, counseling/discussion   Dysphagia   Toxic metabolic encephalopathy multifactorial secondary to toxoplasma encephalitis and aspiration PNA     - continue atovaquone and sulfadiazine-pyrimethamine started 11/3; to clarify duration after repeat MRI (on 11/13)     - Probable aspiration; cortrak pulled out NG pulled out and not replaced 11/7 as family agreed to no artificial feeding; pleasure feeds ongoing at  this time     - completed flagyl for PNA on 11/8  HIV     - holding Biktarvy at this time  Prior concerns for alcoholism     - Discontinued CIWA     - moniyot  Untreated hepatitis C?     - Will need outpatient management if he survives this admission  RPR positive     - per Id: serofast compared to previous titers. No treatment indicated   Normocytic anemia (multifactorial)     - cycling between 7 and 8     - no frank bleed noted     - FOBT is positive; however he is a poor candidate for any procedures at this time     - will monitor for now  Hx of multiple NSTEMI Remote history of VF arrest 2018     - continue Plavix aspirin statin at this time  Goals of Care/Advance Care     - seen by palliative care     - goal is for SNF w/ palliative care  DVT prophylaxis: lovenox Code Status: DNR Family Communication: None at bedside   Disposition Plan: To SNF when available  Consultants:   Neurosurgery  ID  Palliative Care.  Antimicrobials:  . Dapsone . Pyrmethamine-leucovorin . sulfadiazine   ROS:  Unable to obtain d/t mentation.  Subjective: Hypoglycemic this AM  Objective: Vitals:   08/04/19 2032 08/05/19 0002 08/05/19 0412 08/05/19 0737  BP: (!) 165/73 124/74 123/65 130/82  Pulse: 99 80 86 77  Resp: 19 19 19 18   Temp: (!) 97.5 F (36.4 C) (!) 97.5 F (36.4 C) (!) 97.5 F (36.4 C)  97.8 F (36.6 C)  TempSrc: Oral Oral Oral Axillary  SpO2: 92% 96% 98%     Intake/Output Summary (Last 24 hours) at 08/05/2019 1056 Last data filed at 08/05/2019 0606 Gross per 24 hour  Intake -  Output 800 ml  Net -800 ml   There were no vitals filed for this visit.  Examination:  General: 68 y.o. ill appearing male resting in bed in NAD Cardiovascular: RRR, +S1, S2, no m/g/r, equal pulses throughout Respiratory: CTABL, no w/r/r, normal WOB GI: BS+, NDNT, no masses noted, no organomegaly noted Neuro: agitated and pulling at clothes; not following commands   Data  Reviewed: I have personally reviewed following labs and imaging studies.  CBC: Recent Labs  Lab 07/30/19 0813 07/31/19 0314 08/01/19 0256 08/03/19 1024  WBC 4.8 5.4 5.2 4.4  NEUTROABS  --  2.7 2.3 2.0  HGB 7.6* 8.0* 8.1* 8.4*  HCT 22.7* 24.1* 23.9* 24.3*  MCV 95.4 97.2 93.4 93.8  PLT 194 228 212 292   Basic Metabolic Panel: Recent Labs  Lab 07/30/19 0315 07/31/19 0314 08/01/19 0256 08/03/19 1024  NA 135 135 135 135  K 4.3 3.8 3.6 3.6  CL 106 105 106 108  CO2 20* 20* 20* 19*  GLUCOSE 90 81 83 102*  BUN 13 14 13 9   CREATININE 0.87 0.86 0.90 0.80  CALCIUM 8.9 9.0 9.1 9.1  PHOS  --   --  4.4 3.0   GFR: CrCl cannot be calculated (Unknown ideal weight.). Liver Function Tests: Recent Labs  Lab 07/30/19 0315 08/01/19 0256 08/03/19 1024  AST 36  --   --   ALT 20  --   --   ALKPHOS 73  --   --   BILITOT 0.3  --   --   PROT 7.0  --   --   ALBUMIN 3.2* 3.3* 3.4*   No results for input(s): LIPASE, AMYLASE in the last 168 hours. No results for input(s): AMMONIA in the last 168 hours. Coagulation Profile: Recent Labs  Lab 07/30/19 0315  INR 1.1   Cardiac Enzymes: No results for input(s): CKTOTAL, CKMB, CKMBINDEX, TROPONINI in the last 168 hours. BNP (last 3 results) No results for input(s): PROBNP in the last 8760 hours. HbA1C: No results for input(s): HGBA1C in the last 72 hours. CBG: Recent Labs  Lab 08/04/19 2034 08/05/19 0002 08/05/19 0414 08/05/19 0544 08/05/19 0725  GLUCAP 93 86 69* 82 66*   Lipid Profile: No results for input(s): CHOL, HDL, LDLCALC, TRIG, CHOLHDL, LDLDIRECT in the last 72 hours. Thyroid Function Tests: No results for input(s): TSH, T4TOTAL, FREET4, T3FREE, THYROIDAB in the last 72 hours. Anemia Panel: No results for input(s): VITAMINB12, FOLATE, FERRITIN, TIBC, IRON, RETICCTPCT in the last 72 hours. Sepsis Labs: No results for input(s): PROCALCITON, LATICACIDVEN in the last 168 hours.  Recent Results (from the past 240 hour(s))   Culture, blood (routine x 2)     Status: None   Collection Time: 07/28/19  4:10 PM   Specimen: BLOOD  Result Value Ref Range Status   Specimen Description BLOOD LEFT ANTECUBITAL  Final   Special Requests   Final    BOTTLES DRAWN AEROBIC ONLY Blood Culture adequate volume   Culture   Final    NO GROWTH 5 DAYS Performed at [facility], [address], [place], [place]    Report Status 08/02/2019 FINAL  Final  Culture, blood (routine x 2)     Status: None   Collection Time:  07/28/19  4:15 PM   Specimen: BLOOD  Result Value Ref Range Status   Specimen Description BLOOD LEFT ANTECUBITAL  Final   Special Requests   Final    BOTTLES DRAWN AEROBIC ONLY Blood Culture adequate volume   Culture   Final    NO GROWTH 5 DAYS Performed at [facility], [address] [place], [place] [address]    Report Status 08/02/2019 FINAL  Final  MRSA PCR Screening     Status: None   Collection Time: 07/28/19  4:22 PM   Specimen: Nasal Mucosa; Nasopharyngeal  Result Value Ref Range Status   MRSA by PCR NEGATIVE NEGATIVE Final    Comment:        The GeneXpert MRSA Assay (FDA approved for NASAL specimens only), is one component of a comprehensive MRSA colonization surveillance program. It is not intended to diagnose MRSA infection nor to guide or monitor treatment for MRSA infections. Performed at [facility], [address] [place], [place] [address]       Radiology Studies: No results found.   Scheduled Meds: . aspirin  81 mg Oral Daily  . clopidogrel  75 mg Oral Daily  . dapsone  100 mg Oral Daily  . enoxaparin (LOVENOX) injection  40 mg Subcutaneous Q24H  . feeding supplement (ENSURE ENLIVE)  237 mL Oral BID BM  . glucagon (human recombinant)      . influenza vaccine adjuvanted  0.5 mL Intramuscular Tomorrow-1000  . insulin aspart  0-9 Units Subcutaneous Q4H  . Pyrimethamine-Leucovorin  50 mg Oral Daily  . QUEtiapine  25 mg Oral BID  . sulfaDIAZINE   1,000 mg Oral Q6H  . thiamine  100 mg Oral Daily   Continuous Infusions: . dextrose 75 mL/hr at 08/04/19 0110     LOS: 14 days    Time spent: 25 minutes spent in the coordination of care today.    Teddy Spikeyrone A Cambreigh Dearing, DO [facility] Pager [phone]  If 7PM-7AM, please contact night-coverage [email or web address] Password [email or web address] 08/05/2019, 10:56 AM

## 2019-08-05 NOTE — Progress Notes (Signed)
Pt CBG 69. Attempted to administer 1mg  Glucagon, but IV is clotted off. Gave pt 2 fl oz (30mL) vanilla ice cream PO, 8g carbs. Will recheck CBG. Will continue to monitor. Requested IV team start new IV, due to difficult start.

## 2019-08-05 NOTE — Progress Notes (Signed)
Physical Therapy Treatment Patient Details Name: GRYPHON VANDERVEEN MRN: [id number] DOB: April 27, 1951 Today's Date: 08/05/2019    History of Present Illness NATHANIEL WAKELEY is a 68 y.o. male with history of HIV, recently admitted at [facility] for non-ST elevation MI managed conservatively, history of polysubstance abuse, history of VF arrest was recently admitted at [facility] 3 days ago for acute encephalopathy. MRI brain showed multiple masslike areas with a predilection for gray-white matter junction and the ventricles. 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 started on treatment by infectious disease.      PT Comments    Pt is making steady progress with mobility as he requires less assistance for static sitting balance EOB. Pt continues to require mod assist +2 for transfers and max cuing but poor ability to turn head or scan eyes to midline as pt continues to demonstrate R gaze preference. After pt was assisted to recliner pt reports dizziness, vitals checked: BP = 97/66 mmHg, HR = 83 bpm, SpO2 dropped to 78% on room air. RN made aware & pt placed on 1L/min supplemental oxygen (SpO2 increased to 90-93%) via nasal cannula but RN requested to increase to 2L/min. Pt assisted back to bed with +2 assist 2/2 vitals. Once back in bed vitals: BP = 111/69 mmHg, HR = 85 bpm, SpO2 = 100%. Pt lethargic once assisted to recliner & remains lethargic throughout remainder of session. Pt left in bed with BUE wrist restraints & RUE mitten donned, call bell in reach. RN made aware of events of session & pt left on 2L/min supplemental oxygen.  Current d/c recommendations of SNF remains appropriate as pt will require 24 hr assist upon d/c 2/2 cognitive & physical deficits. Will continue to follow acutely to focus on increasing independence with all functional mobility and to attempt to address cognition & R gaze preference.    Follow Up Recommendations  SNF;Supervision/Assistance - 24 hour      Equipment Recommendations  Hospital bed;Wheelchair (measurements PT);Wheelchair cushion (measurements PT);3in1 (PT);Rolling walker with 5" wheels    Recommendations for Other Services       Precautions / Restrictions Precautions Precautions: Fall;Other (comment) Precaution Comments: aspiration (difficulty with salavia and phlem), NGT (pulled out over the weekend), low blood sugar (because he pulled out his feeding tube). Restrictions Weight Bearing Restrictions: No    Mobility  Bed Mobility Overal bed mobility: Needs Assistance Bed Mobility: Sit to Supine;Supine to Sit     Supine to sit: Mod assist;HOB elevated;+2 for physical assistance Sit to supine: Mod assist;HOB elevated;+2 for physical assistance;Max assist   General bed mobility comments: mod assist for BLE but min guard to upright trunk with supine>sit, mod/max assist +2 for sit>supine  Transfers Overall transfer level: Needs assistance Equipment used: Rolling walker (2 wheeled) Transfers: Sit to/from [facility] Sit to Stand: +2 physical assistance;Mod assist;+2 safety/equipment Stand pivot transfers: Max assist;+2 physical assistance;+2 safety/equipment       General transfer comment: elevated EOB, max cuing for hand placement but pt continues to pull up on RW with BUE, cuing & assist with RW management for stand pivot & assistance with eccentric lowering into recliner  Ambulation/Gait                 Stairs             Wheelchair Mobility    Modified Rankin (Stroke Patients Only)       Balance Overall balance assessment: Needs assistance Sitting-balance support: Feet supported;Bilateral upper extremity supported  Sitting balance-Leahy Scale: Fair Sitting balance - Comments: close supervision for static sitting balanc EOB   Standing balance support: Bilateral upper extremity supported Standing balance-Leahy Scale: Poor Standing balance comment: mod assist +2 for sit<>stand  & stand pivot with BUE support on RW                            Cognition Arousal/Alertness: Lethargic(towards end of session)   Overall Cognitive Status: Impaired/Different from baseline Area of Impairment: Orientation;Attention;Memory;Following commands;Awareness;Safety/judgement;Problem solving                 Orientation Level: Disoriented to;Place;Time;Situation Current Attention Level: Sustained Memory: Decreased recall of precautions;Decreased short-term memory Following Commands: Follows one step commands inconsistently;Follows one step commands with increased time Safety/Judgement: Decreased awareness of safety;Decreased awareness of deficits Awareness: Intellectual Problem Solving: Decreased initiation;Requires verbal cues;Difficulty sequencing;Requires tactile cues;Slow processing General Comments: Pt continues to have significant R gaze, unable to track to midline even when head is assisted with turning L      Exercises      General Comments        Pertinent Vitals/Pain Pain Assessment: Faces Faces Pain Scale: No hurt    Home Living                      Prior Function            PT Goals (current goals can now be found in the care plan section) Acute Rehab PT Goals Patient Stated Goal: pt did not state PT Goal Formulation: Patient unable to participate in goal setting Time For Goal Achievement: 08/10/19 Potential to Achieve Goals: Fair Progress towards PT goals: Progressing toward goals    Frequency    Min 2X/week      PT Plan Current plan remains appropriate    Co-evaluation PT/OT/SLP Co-Evaluation/Treatment: Yes Reason for Co-Treatment: Necessary to address cognition/behavior during functional activity;To address functional/ADL transfers;Complexity of the patient's impairments (multi-system involvement) PT goals addressed during session: Mobility/safety with mobility;Balance        AM-PAC PT "6 Clicks" Mobility    Outcome Measure  Help needed turning from your back to your side while in a flat bed without using bedrails?: A Lot Help needed moving from lying on your back to sitting on the side of a flat bed without using bedrails?: A Lot Help needed moving to and from a bed to a chair (including a wheelchair)?: A Lot Help needed standing up from a chair using your arms (e.g., wheelchair or bedside chair)?: A Lot Help needed to walk in hospital room?: A Lot Help needed climbing 3-5 steps with a railing? : Total 6 Click Score: 11    End of Session Equipment Utilized During Treatment: Gait belt;Oxygen Activity Tolerance: Patient limited by fatigue;Treatment limited secondary to medical complications (Comment) Patient left: in bed;with call bell/phone within reach;with SCD's reapplied Nurse Communication: Mobility status(low oxgyen, low BP) PT Visit Diagnosis: Unsteadiness on feet (R26.81);Muscle weakness (generalized) (M62.81);Difficulty in walking, not elsewhere classified (R26.2)     Time: 7408-1448 PT Time Calculation (min) (ACUTE ONLY): 34 min  Charges:  $Therapeutic Activity: 8-22 mins                         Sandi Mariscal, PT, DPT 08/05/2019, 11:22 AM

## 2019-08-06 LAB — CBC WITH DIFFERENTIAL/PLATELET
Abs Immature Granulocytes: 0.06 10*3/uL (ref 0.00–0.07)
Basophils Absolute: 0 10*3/uL (ref 0.0–0.1)
Basophils Relative: 0 %
Eosinophils Absolute: 0 10*3/uL (ref 0.0–0.5)
Eosinophils Relative: 0 %
HCT: 22.7 % — ABNORMAL LOW (ref 39.0–52.0)
Hemoglobin: 7.5 g/dL — ABNORMAL LOW (ref 13.0–17.0)
Immature Granulocytes: 1 %
Lymphocytes Relative: 28 %
Lymphs Abs: 2.9 10*3/uL (ref 0.7–4.0)
MCH: 32.6 pg (ref 26.0–34.0)
MCHC: 33 g/dL (ref 30.0–36.0)
MCV: 98.7 fL (ref 80.0–100.0)
Monocytes Absolute: 0.7 10*3/uL (ref 0.1–1.0)
Monocytes Relative: 7 %
Neutro Abs: 6.9 10*3/uL (ref 1.7–7.7)
Neutrophils Relative %: 64 %
Platelets: 322 10*3/uL (ref 150–400)
RBC: 2.3 MIL/uL — ABNORMAL LOW (ref 4.22–5.81)
RDW: 15.8 % — ABNORMAL HIGH (ref 11.5–15.5)
WBC: 10.6 10*3/uL — ABNORMAL HIGH (ref 4.0–10.5)
nRBC: 0 % (ref 0.0–0.2)

## 2019-08-06 LAB — GLUCOSE, CAPILLARY
Glucose-Capillary: 42 mg/dL — CL (ref 70–99)
Glucose-Capillary: 64 mg/dL — ABNORMAL LOW (ref 70–99)
Glucose-Capillary: 68 mg/dL — ABNORMAL LOW (ref 70–99)
Glucose-Capillary: 70 mg/dL (ref 70–99)
Glucose-Capillary: 80 mg/dL (ref 70–99)
Glucose-Capillary: 83 mg/dL (ref 70–99)
Glucose-Capillary: 92 mg/dL (ref 70–99)

## 2019-08-06 LAB — RENAL FUNCTION PANEL
Albumin: 3.4 g/dL — ABNORMAL LOW (ref 3.5–5.0)
Anion gap: 9 (ref 5–15)
BUN: 13 mg/dL (ref 8–23)
CO2: 19 mmol/L — ABNORMAL LOW (ref 22–32)
Calcium: 9.1 mg/dL (ref 8.9–10.3)
Chloride: 106 mmol/L (ref 98–111)
Creatinine, Ser: 1 mg/dL (ref 0.61–1.24)
GFR calc Af Amer: >60 mL/min (ref >60)
GFR calc non Af Amer: >60 mL/min (ref >60)
Glucose, Bld: 83 mg/dL (ref 70–99)
Phosphorus: 3.7 mg/dL (ref 2.5–4.6)
Potassium: 3.7 mmol/L (ref 3.5–5.1)
Sodium: 134 mmol/L — ABNORMAL LOW (ref 135–145)

## 2019-08-06 LAB — MAGNESIUM: Magnesium: 1.8 mg/dL (ref 1.7–2.4)

## 2019-08-06 MED ORDER — GLUCOSE 40 % PO GEL
ORAL | Status: AC
  Administered 2019-08-06: 07:00:00 0.5
  Filled 2019-08-06: qty 1

## 2019-08-06 MED ORDER — GLUCAGON HCL RDNA (DIAGNOSTIC) 1 MG IJ SOLR
INTRAMUSCULAR | Status: AC
  Administered 2019-08-06: 1 mg
  Filled 2019-08-06: qty 1

## 2019-08-06 MED ORDER — DEXTROSE-NACL 5-0.45 % IV SOLN
INTRAVENOUS | Status: DC
  Administered 2019-08-06 – 2019-08-07 (×2): via INTRAVENOUS

## 2019-08-06 NOTE — Progress Notes (Signed)
Pt CBG @ 0340 was 68. This nurse administered 1mg  Glucagen. Will reassess CBG. Will continue to monitor.

## 2019-08-06 NOTE — Progress Notes (Signed)
  Speech Language Pathology Treatment: Dysphagia  Patient Details Name: MATVEY LLANAS MRN: [id number] DOB: 18-Nov-1950 Today's Date: 08/06/2019 Time: 4854-6270 SLP Time Calculation (min) (ACUTE ONLY): 12 min  Assessment / Plan / Recommendation Clinical Impression  Pt initially not responding to SLP, but eventually opened his eyes and verbalized willingness to drink some orange juice. Upon return with the juice, pt swatted at SLP and refused to accept any. Nurse tech reports tolerance of juice this morning, but indicates pt will spit out what he does not want. RN reported tolerance of meds in puree. No family present for education at this time. Palliative Care involvement continues to be recommended to facilitate establishment of appropriate goals of care.    HPI HPI: Mr JRUE JARRIEL, 68 y/m, transfered from [facility] for infectious disease input and possible advanced care. PMH: h/o HIV,  non-ST elevation MI, poly substance abuse, VF arrest and recent admission to [facility]. for acute encephalopathy. 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 No previous ST services.       SLP Plan  Continue with current plan of care       Recommendations  Diet recommendations: Other(comment)(full liquid) Medication Administration: Crushed with puree                Oral Care Recommendations: Oral care QID;Staff/trained caregiver to provide oral care Follow up Recommendations: Skilled Nursing facility;24 hour supervision/assistance SLP Visit Diagnosis: Dysphagia, unspecified (R13.10) Plan: Continue with current plan of care       [place], [facility], [place] Speech Language Pathologist Office: [phone] Pager: [phone]   Shonna Chock 08/06/2019, 2:35 PM

## 2019-08-06 NOTE — Progress Notes (Signed)
[name].  PROGRESS NOTE    Henry Oconnell  ZOX:[id number] DOB: 05/02/1951 DOA: 07/22/2019 PCP: Patient, No Pcp Per   Brief Narrative:   68 year old male COPD/asthma VF arrest 2018 hep C, history of IV drug use, HIV noncompliant with antiretrovirals for over 7 months, NSTEMI Rx [facility] you 07/05/19/2020-10/14 2020-also prior heart attack 5 to 6 months prior to the symptoms in hospital for 2 weeks had Zio patch at that time was treated with heparin loaded with aspirin and told to continue meds was not placed on other meds on discharge because of poor compliance borderline bradycardia Presented to [facility] ~ 10/25 for acute encephalopathy-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 Infectious disease as well neurosurgery were consulted given areas in the brain showing these lesions--his serologies show toxoplasmosis and he was started on definitive treatment for the same It was felt that he became more somnolent and was starting to aspirate on 11/3 and antibiotics were adjusted to Flagyl on 11/5 and palliative care was reconsulted regarding plan of care  11/11: Hypoglycemic ON. PO intake poor. Working on placement. 11/12: Hypoglycemic again ON. Hallucination per nursing.    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HCC) Active Problems:   Human immunodeficiency virus (HIV) disease (HCC)   Hepatitis C   Acute encephalopathy   Brain mass   Coronary artery disease involving native coronary artery of native heart without angina pectoris   Palliative care by specialist   Goals of care, counseling/discussion   Dysphagia  Toxic metabolic encephalopathy multifactorial secondary to toxoplasma encephalitis and aspiration PNA Hypoglycemia     - continue atovaquone and sulfadiazine-pyrimethamine started 11/3; to clarify duration after repeat MRI (on 11/13)     - Probable aspiration; cortrak pulled out NG pulled out and not replaced 11/7  as family agreed to no artificial feeding; pleasure feeds ongoing at this time     - completed flagyl for PNA on 11/8     - add D5-1/2NS for hypoglycemia, poor PO intake, follow up with PC  HIV     - holding Biktarvy at this time  Prior concerns for alcoholism     - Discontinued CIWA     - monitor  Untreated hepatitis C?     - Will need outpatient management if he survives this admission  RPR positive     - per ID: serofast compared to previous titers. No treatment indicated   Normocytic anemia (multifactorial)     - cycling between 7 and 8     - no frank bleed noted     - FOBT is positive; however he is a poor candidate for any procedures at this time     - will monitor for now  Hx of multiple NSTEMI Remote history of VF arrest 2018     - continue Plavix aspirin statin at this time  Goals of Care/Advance Care     - seen by palliative care     - goal is for SNF w/ palliative care     - 11/12: spoke with PC; poor PO intake despite being pleasure feeds, remains hypoglycemic; without artificial support, would not expect him to survive long; not currently comfort care, so will be starting D5 today.   Hypoglycemic. WBC up but afebrile. Has had pleasure feeds, so aspiration always in play. Very poor prognosis. Spoke with PC. Appreciate assistance.  DVT prophylaxis: lovenox Code Status: DNR   Disposition Plan: To SNF when available  Consultants:   Neurosurgery  ID  Palliative Care  Antimicrobials:  . Dapsone . Pyrmethamine-leucovorin . sulfadiazine   ROS:  Unable to obtain d/t mentation.  Subjective: Hypoglycemic again this AM. Hallucinations per nursing.  Objective: Vitals:   08/05/19 1619 08/05/19 2033 08/06/19 0007 08/06/19 0338  BP: 132/77 (!) 116/44 109/70 120/67  Pulse: 92 63 87 79  Resp: 18 19 19 19   Temp: 98.9 F (37.2 C) 98.1 F (36.7 C) 97.8 F (36.6 C) 98.5 F (36.9 C)  TempSrc: Axillary Oral Oral Oral  SpO2: 96% (!) 88% 99% 95%     Intake/Output Summary (Last 24 hours) at 08/06/2019 0717 Last data filed at 08/06/2019 8101 Gross per 24 hour  Intake -  Output 1000 ml  Net -1000 ml   There were no vitals filed for this visit.  Examination:  General: 68 y.o.  Ill appearing  male resting in bed Cardiovascular: RRR, +S1, S2, no m/g/r, equal pulses throughout Respiratory: clear but shallow, no w/r/r, normal WOB GI: BS+, NDNT, no masses noted, no organomegaly noted MSK: No e/c/c Neuro: somnolent   Data Reviewed: I have personally reviewed following labs and imaging studies.  CBC: Recent Labs  Lab 07/30/19 0813 07/31/19 0314 08/01/19 0256 08/03/19 1024  WBC 4.8 5.4 5.2 4.4  NEUTROABS  --  2.7 2.3 2.0  HGB 7.6* 8.0* 8.1* 8.4*  HCT 22.7* 24.1* 23.9* 24.3*  MCV 95.4 97.2 93.4 93.8  PLT 194 228 212 751   Basic Metabolic Panel: Recent Labs  Lab 07/31/19 0314 08/01/19 0256 08/03/19 1024  NA 135 135 135  K 3.8 3.6 3.6  CL 105 106 108  CO2 20* 20* 19*  GLUCOSE 81 83 102*  BUN 14 13 9   CREATININE 0.86 0.90 0.80  CALCIUM 9.0 9.1 9.1  PHOS  --  4.4 3.0   GFR: CrCl cannot be calculated (Unknown ideal weight.). Liver Function Tests: Recent Labs  Lab 08/01/19 0256 08/03/19 1024  ALBUMIN 3.3* 3.4*   No results for input(s): LIPASE, AMYLASE in the last 168 hours. No results for input(s): AMMONIA in the last 168 hours. Coagulation Profile: No results for input(s): INR, PROTIME in the last 168 hours. Cardiac Enzymes: No results for input(s): CKTOTAL, CKMB, CKMBINDEX, TROPONINI in the last 168 hours. BNP (last 3 results) No results for input(s): PROBNP in the last 8760 hours. HbA1C: No results for input(s): HGBA1C in the last 72 hours. CBG: Recent Labs  Lab 08/05/19 2034 08/05/19 2126 08/06/19 0009 08/06/19 0340 08/06/19 0650  GLUCAP 64* 91 64* 68* 42*   Lipid Profile: No results for input(s): CHOL, HDL, LDLCALC, TRIG, CHOLHDL, LDLDIRECT in the last 72 hours. Thyroid Function Tests: No  results for input(s): TSH, T4TOTAL, FREET4, T3FREE, THYROIDAB in the last 72 hours. Anemia Panel: No results for input(s): VITAMINB12, FOLATE, FERRITIN, TIBC, IRON, RETICCTPCT in the last 72 hours. Sepsis Labs: No results for input(s): PROCALCITON, LATICACIDVEN in the last 168 hours.  Recent Results (from the past 240 hour(s))  Culture, blood (routine x 2)     Status: None   Collection Time: 07/28/19  4:10 PM   Specimen: BLOOD  Result Value Ref Range Status   Specimen Description BLOOD LEFT ANTECUBITAL  Final   Special Requests   Final    BOTTLES DRAWN AEROBIC ONLY Blood Culture adequate volume   Culture   Final    NO GROWTH 5 DAYS Performed at [facility], [address] [place], [place] [address]    Report Status  08/02/2019 FINAL  Final  Culture, blood (routine x 2)     Status: None   Collection Time: 07/28/19  4:15 PM   Specimen: BLOOD  Result Value Ref Range Status   Specimen Description BLOOD LEFT ANTECUBITAL  Final   Special Requests   Final    BOTTLES DRAWN AEROBIC ONLY Blood Culture adequate volume   Culture   Final    NO GROWTH 5 DAYS Performed at [facility], [address] [place], [place] [address]    Report Status 08/02/2019 FINAL  Final  MRSA PCR Screening     Status: None   Collection Time: 07/28/19  4:22 PM   Specimen: Nasal Mucosa; Nasopharyngeal  Result Value Ref Range Status   MRSA by PCR NEGATIVE NEGATIVE Final    Comment:        The GeneXpert MRSA Assay (FDA approved for NASAL specimens only), is one component of a comprehensive MRSA colonization surveillance program. It is not intended to diagnose MRSA infection nor to guide or monitor treatment for MRSA infections. Performed at [facility], [address] [place], [place] [address]       Radiology Studies: No results found.   Scheduled Meds: . aspirin  81 mg Oral Daily  . clopidogrel  75 mg Oral Daily  . dapsone  100 mg Oral Daily  . dextrose      . enoxaparin  (LOVENOX) injection  40 mg Subcutaneous Q24H  . feeding supplement (ENSURE ENLIVE)  237 mL Oral BID BM  . glucagon (human recombinant)      . influenza vaccine adjuvanted  0.5 mL Intramuscular Tomorrow-1000  . insulin aspart  0-9 Units Subcutaneous Q4H  . Pyrimethamine-Leucovorin  50 mg Oral Daily  . QUEtiapine  25 mg Oral BID  . sulfaDIAZINE  1,000 mg Oral Q6H  . thiamine  100 mg Oral Daily   Continuous Infusions: . dextrose 75 mL/hr at 08/04/19 0110     LOS: 15 days    Time spent: 25 minutes spent in the coordination of care today.    Teddy Spike, DO [facility] Pager [phone]  If 7PM-7AM, please contact night-coverage [email or web address] Password [facility] [place] [facility] 08/06/2019, 7:17 AM

## 2019-08-06 NOTE — Progress Notes (Signed)
Inpatient Diabetes Program Recommendations  AACE/ADA: New Consensus Statement on Inpatient Glycemic Control  Target Ranges:  Prepandial:   less than 140 mg/dL      Peak postprandial:   less than 180 mg/dL (1-2 hours)      Critically ill patients:  140 - 180 mg/dL   Results for CASHUS, HALTERMAN (MRN [id number]) as of 08/06/2019 13:18  Ref. Range 08/05/2019 07:25 08/05/2019 11:36 08/05/2019 16:18 08/05/2019 20:34 08/05/2019 21:26 08/06/2019 00:09 08/06/2019 03:40 08/06/2019 06:50 08/06/2019 07:20 08/06/2019 12:09  Glucose-Capillary Latest Ref Range: 70 - 99 mg/dL 66 (L) 90 77 64 (L) 91 64 (L) 68 (L) 42 (LL) 83 92   Review of Glycemic Control  Current orders for Inpatient glycemic control: Novolog 0-9 units Q4H  Inpatient Diabetes Program Recommendations:   Insulin-Correction: NO Novolog insulin given since 08/02/19. Patient with persistent hypoglycemia. Please discontinue Novolog correction scale.  Thanks, Barnie Alderman, RN, MSN, CDE Diabetes Coordinator Inpatient Diabetes Program [phone] (Team Pager from 8am to 5pm)

## 2019-08-06 NOTE — Progress Notes (Signed)
Pt CBG @0653  was 42. This nurse administered 1mg  Glucagen and .5 tube Glutose. When CBG re-taken at 0720, new reading was 83. Report given to day nurse, who will continue to monitor.      [place] Progression Recent Vital Signs   BP 119/66 (BP Location: Left Arm)   Pulse 80   Temp 97.6 F (36.4 C) (Axillary)   Resp 18   SpO2 95%    Past Medical History:  Diagnosis Date  . Asthma   . COPD (chronic obstructive pulmonary disease) ([place])   . Hep C w/ coma, chronic ([place])   . HIV (human immunodeficiency virus infection) ([place])   . Hypertension      Expected Discharge Date     Diet Order            Diet full liquid Room service appropriate? Yes; Fluid consistency: Thin  Diet effective now               VTE Documentation  (lovenox sq)   Work Intensity Score/Level of Care  3  @LEVELOFCARE @   Mobility  Head of Bed Elevated : HOB less than 20 Activity: Turned to back (supine) Range of Motion: Active, All extremities Level of Assistance: Total care Assistive Device: None Bed Position: Semi-fowlers Transport method: Bed     Consult Orders  (From admission, onward)         Start     Ordered   08/01/19 1428  SLP eval and treat Reason for evaluation: .Swallowing evaluation (BSE, MBS and/or diet order as indicated) Pulled NGT and cortrak but swallowing some clear liquids-high risk aspiration-- will speak to family once you see  Once    Comments: Pulled NGT and cortrak but swallowing some clear liquids-high risk aspiration-- will speak to family once you see  Question:  Reason for evaluation  Answer:  .Swallowing evaluation (BSE, MBS and/or diet order as indicated)   08/01/19 1428   07/29/19 1017  SLP eval and treat Reason for evaluation: .Swallowing evaluation (BSE, MBS and/or diet order as indicated)  Once    Question:  Reason for evaluation  Answer:  .Swallowing evaluation (BSE, MBS and/or diet order as indicated)   07/29/19 1044   07/25/19 1025  PT eval and treat   Routine     07/25/19 1024   07/25/19 1025  OT eval and treat  Routine     07/25/19 1024   07/23/19 0914  Consult to palliative care  Once    Provider:  (Not yet assigned)  [facility] Consult   Reason for Consult? to establish goals of care      07/23/19 0913   07/23/19 0642  SLP eval and treat Reason for evaluation: .Swallowing evaluation (BSE, MBS and/or diet order as indicated)  Once    Question:  Reason for evaluation  Answer:  .Swallowing evaluation (BSE, MBS and/or diet order as indicated)   07/23/19 0641           Significant Events    DC Barriers   Abnormal Labs:  Carmela Rima 08/06/2019, 7:50 AM tube

## 2019-08-07 DIAGNOSIS — G9389 Other specified disorders of brain: Secondary | ICD-10-CM

## 2019-08-07 LAB — GLUCOSE, CAPILLARY
Glucose-Capillary: 106 mg/dL — ABNORMAL HIGH (ref 70–99)
Glucose-Capillary: 119 mg/dL — ABNORMAL HIGH (ref 70–99)
Glucose-Capillary: 93 mg/dL (ref 70–99)
Glucose-Capillary: 95 mg/dL (ref 70–99)

## 2019-08-07 MED ORDER — ACETAMINOPHEN 325 MG PO TABS: 650.0000 mg | ORAL_TABLET | Freq: Four times a day (QID) | ORAL | Status: DC | PRN

## 2019-08-07 MED ORDER — HALOPERIDOL LACTATE 2 MG/ML PO CONC
0.5000 mg | ORAL | Status: DC | PRN
  Filled 2019-08-07: qty 0.3

## 2019-08-07 MED ORDER — HALOPERIDOL 0.5 MG PO TABS
0.5000 mg | ORAL_TABLET | ORAL | Status: DC | PRN
  Filled 2019-08-07: qty 1

## 2019-08-07 MED ORDER — POLYVINYL ALCOHOL 1.4 % OP SOLN
1.0000 [drp] | Freq: Four times a day (QID) | OPHTHALMIC | Status: DC | PRN
  Filled 2019-08-07: qty 15

## 2019-08-07 MED ORDER — GLYCOPYRROLATE 0.2 MG/ML IJ SOLN: 0.2000 mg | INTRAMUSCULAR | Status: DC | PRN

## 2019-08-07 MED ORDER — ONDANSETRON 4 MG PO TBDP: 4.0000 mg | ORAL_TABLET | Freq: Four times a day (QID) | ORAL | Status: DC | PRN

## 2019-08-07 MED ORDER — GLYCOPYRROLATE 1 MG PO TABS
1.0000 mg | ORAL_TABLET | ORAL | Status: DC | PRN
  Filled 2019-08-07: qty 1

## 2019-08-07 MED ORDER — HALOPERIDOL LACTATE 5 MG/ML IJ SOLN: 0.5000 mg | INTRAMUSCULAR | Status: DC | PRN

## 2019-08-07 MED ORDER — ONDANSETRON HCL 4 MG/2ML IJ SOLN: 4.0000 mg | Freq: Four times a day (QID) | INTRAMUSCULAR | Status: DC | PRN

## 2019-08-07 MED ORDER — ACETAMINOPHEN 650 MG RE SUPP: 650.0000 mg | Freq: Four times a day (QID) | RECTAL | Status: DC | PRN

## 2019-08-07 MED ORDER — BIOTENE DRY MOUTH MT LIQD: 15.0000 mL | OROMUCOSAL | Status: DC | PRN

## 2019-08-07 NOTE — Progress Notes (Signed)
Received a call back from patient's brother Education officer, community) Hulen Skains.  Please DC patient to [facility] in [place] (as his family is there) as soon as a bed is available.  Will continue IVF until DC allowing his family time to visit with him after DC.  Florentina Jenny, PA-C Palliative Medicine Office:  [phone]

## 2019-08-07 NOTE — Progress Notes (Signed)
[facility]  Referral received from [facility]. Spoke with pt's brother Hulen Skains. He would like for the pt's sibling there sister to be present for this decision as well. She will be arriving this evening. They are in agreement to the services and plan however will sign paperwork for transfer to Hospice care tomorrow morning at 1100am. Transfer can be set up for [address] is aware of this. Webb Silversmith RN [phone]

## 2019-08-07 NOTE — Progress Notes (Signed)
[name]  PROGRESS NOTE    Henry Oconnell  PZW:[id number] DOB: 1951-03-29 DOA: 07/22/2019 PCP: Patient, No Pcp Per   Brief Narrative:   68 year old male COPD/asthma VF arrest 2018 hep C, history of IV drug use, HIV noncompliant with antiretrovirals for over 7 months, NSTEMI Rx WF you 07/05/19/2020-10/14 2020-also prior heart attack 5 to 6 months prior to the symptoms in hospital for 2 weeks had Zio patch at that time was treated with heparin loaded with aspirin and told to continue meds was not placed on other meds on discharge because of poor compliance borderline bradycardia Presented to [facility] ~ 10/25 for acute encephalopathy-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 Infectious disease as well neurosurgery were consulted given areas in the brain showing these lesions--his serologies show toxoplasmosis and he was started on definitive treatment for the same It was felt that he became more somnolent and was starting to aspirate on 11/3 and antibiotics were adjusted to Flagyl on 11/5 and palliative care was reconsulted regarding plan of care  11/11: Hypoglycemic ON. PO intake poor. Working on placement. 11/12: Hypoglycemic again ON. Hallucination per nursing.  11/13: Patient with decreasing PO intake and increasing hallucinations. Has no required IVF to maintain glucose. Family has elected comfort care measures. Will maintain IVF for now. Paperwork for transfer to hospice facility to be completed in the AM.    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[place]) Active Problems:   Human immunodeficiency virus (HIV) disease ([place])   Hepatitis C   Acute encephalopathy   Brain mass   Coronary artery disease involving native coronary artery of native heart without angina pectoris   Palliative care by specialist   Goals of care, counseling/discussion   Dysphagia  Toxic metabolic encephalopathy multifactorial secondary to  toxoplasma encephalitis and aspiration PNA Hypoglycemia HIV Untreated hepatitis C? RPR positive Normocytic anemia (multifactorial) Hx of multiple NSTEMI Remote history of VF arrest 2018 Goals of Care/Advance Care  Family has elected comfort care. To be transferred to hospice facility after completion of paperwork in the AM. Continue IVF for now. Otherwise, stop lab draws and other aggressive care.   DVT prophylaxis: None Code Status: DNR   Disposition Plan: To hospice facility in AM.  Consultants:   Neurosurgery  ID  Palliative Care  ROS:  Unable to obtain d/t mentation  Subjective: Agitated ON per nursing.  Objective: Vitals:   08/07/19 0014 08/07/19 0424 08/07/19 0755 08/07/19 1140  BP: (!) 104/55 106/65 109/61 119/68  Pulse: 79 84 71 78  Resp: 17 19 20 16   Temp: 97.8 F (36.6 C) 98.1 F (36.7 C) 98.3 F (36.8 C) 98.9 F (37.2 C)  TempSrc: Oral Oral Oral Oral  SpO2: 95% 97% 98% 96%    Intake/Output Summary (Last 24 hours) at 08/07/2019 1433 Last data filed at 08/07/2019 0750 Gross per 24 hour  Intake 829.01 ml  Output 300 ml  Net 529.01 ml   There were no vitals filed for this visit.  Examination:  General: 68 y.o. ill appearing male resting in bed Cardiovascular: RRR, +S1, S2, no m/g/r, equal pulses throughout Respiratory: CTABL, no w/r/r, normal WOB GI: BS+, NDNT MSK: No e/c/c Psyc: agitated this AM, confused, not following commands   Data Reviewed: I have personally reviewed following labs and imaging studies.  CBC: Recent Labs  Lab 08/01/19 0256 08/03/19 1024 08/06/19 1001  WBC 5.2 4.4 10.6*  NEUTROABS 2.3 2.0 6.9  HGB 8.1* 8.4* 7.5*  HCT 23.9* 24.3* 22.7*  MCV 93.4 93.8 98.7  PLT 212 292 322   Basic Metabolic Panel: Recent Labs  Lab 08/01/19 0256 08/03/19 1024 08/06/19 1001  NA 135 135 134*  K 3.6 3.6 3.7  CL 106 108 106  CO2 20* 19* 19*  GLUCOSE 83 102* 83  BUN 13 9 13   CREATININE 0.90 0.80 1.00  CALCIUM 9.1 9.1 9.1   MG  --   --  1.8  PHOS 4.4 3.0 3.7   GFR: CrCl cannot be calculated (Unknown ideal weight.). Liver Function Tests: Recent Labs  Lab 08/01/19 0256 08/03/19 1024 08/06/19 1001  ALBUMIN 3.3* 3.4* 3.4*   No results for input(s): LIPASE, AMYLASE in the last 168 hours. No results for input(s): AMMONIA in the last 168 hours. Coagulation Profile: No results for input(s): INR, PROTIME in the last 168 hours. Cardiac Enzymes: No results for input(s): CKTOTAL, CKMB, CKMBINDEX, TROPONINI in the last 168 hours. BNP (last 3 results) No results for input(s): PROBNP in the last 8760 hours. HbA1C: No results for input(s): HGBA1C in the last 72 hours. CBG: Recent Labs  Lab 08/06/19 2016 08/07/19 0015 08/07/19 0425 08/07/19 0744 08/07/19 1138  GLUCAP 70 119* 95 93 106*   Lipid Profile: No results for input(s): CHOL, HDL, LDLCALC, TRIG, CHOLHDL, LDLDIRECT in the last 72 hours. Thyroid Function Tests: No results for input(s): TSH, T4TOTAL, FREET4, T3FREE, THYROIDAB in the last 72 hours. Anemia Panel: No results for input(s): VITAMINB12, FOLATE, FERRITIN, TIBC, IRON, RETICCTPCT in the last 72 hours. Sepsis Labs: No results for input(s): PROCALCITON, LATICACIDVEN in the last 168 hours.  Recent Results (from the past 240 hour(s))  Culture, blood (routine x 2)     Status: None   Collection Time: 07/28/19  4:10 PM   Specimen: BLOOD  Result Value Ref Range Status   Specimen Description BLOOD LEFT ANTECUBITAL  Final   Special Requests   Final    BOTTLES DRAWN AEROBIC ONLY Blood Culture adequate volume   Culture   Final    NO GROWTH 5 DAYS Performed at [facility], [address] [place], [place] [address]    Report Status 08/02/2019 FINAL  Final  Culture, blood (routine x 2)     Status: None   Collection Time: 07/28/19  4:15 PM   Specimen: BLOOD  Result Value Ref Range Status   Specimen Description BLOOD LEFT ANTECUBITAL  Final   Special Requests   Final    BOTTLES DRAWN  AEROBIC ONLY Blood Culture adequate volume   Culture   Final    NO GROWTH 5 DAYS Performed at [facility], [address] [place], [place] [address]    Report Status 08/02/2019 FINAL  Final  MRSA PCR Screening     Status: None   Collection Time: 07/28/19  4:22 PM   Specimen: Nasal Mucosa; Nasopharyngeal  Result Value Ref Range Status   MRSA by PCR NEGATIVE NEGATIVE Final    Comment:        The GeneXpert MRSA Assay (FDA approved for NASAL specimens only), is one component of a comprehensive MRSA colonization surveillance program. It is not intended to diagnose MRSA infection nor to guide or monitor treatment for MRSA infections. Performed at [facility], [address] [place], [place] [address]       Radiology Studies: No results found.   Scheduled Meds: . dapsone  100 mg Oral Daily  . feeding supplement (ENSURE ENLIVE)  237 mL Oral  BID BM  . influenza vaccine adjuvanted  0.5 mL Intramuscular Tomorrow-1000  . Pyrimethamine-Leucovorin  50 mg Oral Daily  . QUEtiapine  25 mg Oral BID  . sulfaDIAZINE  1,000 mg Oral Q6H   Continuous Infusions: . dextrose 75 mL/hr at 08/04/19 0110  . dextrose 5 % and 0.45% NaCl 75 mL/hr at 08/07/19 1212     LOS: 16 days    Time spent: 25 minutes spent in the coordination of care today.    Teddy Spike, DO [facility] Pager [phone]  If 7PM-7AM, please contact night-coverage [email or web address] Password [facility] 08/07/2019, 2:33 PM

## 2019-08-07 NOTE — TOC Progression Note (Signed)
Transition of Care [facility]) - Progression Note    Patient Details  Name: Henry Oconnell MRN: [id number] Date of Birth: 06/20/1951  Transition of [facility]) CM/SW [place], [place] Phone Number: 08/07/2019, 12:33 PM  Clinical Narrative:   CSW acknowledging consult for hospice placement. Patient's brother requesting placement at hospice home in [place]. CSW contacted [facility] to provide referral. CSW to follow.    Expected Discharge Plan: [employer] Barriers to Discharge: Hospice Bed not available  Expected Discharge Plan and Services Expected Discharge Plan: [place] In-house Referral: Clinical Social Work Discharge Planning Services: CM Consult Post Acute Care Choice: Hospice                                         Social Determinants of Health (SDOH) Interventions    Readmission Risk Interventions No flowsheet data found.

## 2019-08-07 NOTE — Progress Notes (Signed)
Daily Progress Note   Patient Name: Henry Oconnell       Date: 08/07/2019 DOB: 08/28/1951  Age: 68 y.o. MRN#: [id number] Attending Physician: Jonnie Finner, DO Primary Care Physician: Patient, No Pcp Per Admit Date: 07/22/2019  Reason for Consultation/Follow-up: Establishing goals of care and Psychosocial/spiritual support  Subjective: Patient greets me.  His gaze is to the right.  He appears weak.  Denies discomfort.  I asked if he has been eating and he tells me yes.  I asked what he had for breakfast and he replies scrambled eggs.  He wants his mitts off.  I left a voice mail for his brother Henry Oconnell to the effect that Henry Oconnell is not eating and we need to talk about next steps.  I asked for a return call.  From my previous conversation with Henry Oconnell he seemed accepting of Henry Oconnell' situation.  He wanted him to return to [facility] with palliative to follow.      Assessment: Patient appears noticeably weaker than Monday.   Per Notes and RN tech he is refusing all food.  Occasionally he will take a sip of drink.  Patient Profile/HPI:  68 yo gentleman with history of HIV, history of recent NSTEMI, history of V fib arrest, poly substance use, admitted with altered mental status, has been transferred from [facility]. 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 Patient has been seen by SLP, has been advised Dysphagia 2 diet with thin liquids. He remains admitted to hospital medicine service, with input from cardiology, neurology as well as infectious diseases requested. Additionally, a palliative medicine consult has been requested for supportive care, ongoing goals of care discussions. Further imaging with CT chest/abd/pelvis has been requested, neurosurgery input has also been  requested.     Length of Stay: 16  Current Medications: Scheduled Meds:   aspirin  81 mg Oral Daily   clopidogrel  75 mg Oral Daily   dapsone  100 mg Oral Daily   enoxaparin (LOVENOX) injection  40 mg Subcutaneous Q24H   feeding supplement (ENSURE ENLIVE)  237 mL Oral BID BM   influenza vaccine adjuvanted  0.5 mL Intramuscular Tomorrow-1000   insulin aspart  0-9 Units Subcutaneous Q4H   Pyrimethamine-Leucovorin  50 mg Oral Daily   QUEtiapine  25 mg Oral BID   sulfaDIAZINE  1,000 mg Oral Q6H   thiamine  100 mg Oral Daily    Continuous Infusions:  dextrose 75 mL/hr at 08/04/19 0110   dextrose 5 % and 0.45% NaCl 75 mL/hr at 08/06/19 1055    PRN Meds: acetaminophen **OR** acetaminophen, albuterol, hydrALAZINE, ondansetron **OR** ondansetron (ZOFRAN) IV  Physical Exam        Thin, frail, awake, alert, cooperative.  Appears more withdrawn but interacts with me. CV rrr resp no distress Abdomen thin, soft, nt, nd   Vital Signs: BP 109/61 (BP Location: Left Arm)    Pulse 71    Temp 98.3 F (36.8 C) (Oral)    Resp 20    SpO2 98%  SpO2: SpO2: 98 % O2 Device: O2 Device: Room Air O2 Flow Rate:    Intake/output summary:   Intake/Output Summary (Last 24 hours) at 08/07/2019 1049 Last data filed at 08/07/2019 0750 Gross per 24 hour  Intake 829.01 ml  Output 300 ml  Net 529.01 ml   LBM: Last BM Date: 08/04/19 Baseline Weight:   Most recent weight:         Palliative Assessment/Data: 20%    Flowsheet Rows     Most Recent Value  Intake Tab  Referral Department  Hospitalist  Unit at Time of Referral  Med/Surg Unit  Date Notified  07/23/19  Palliative Care Type  New Palliative care  Reason for referral  Clarify Goals of Care  Date of Admission  07/22/19  Date first seen by Palliative Care  07/23/19  # of days Palliative referral response time  0 Day(s)  # of days IP prior to Palliative referral  1  Clinical Assessment  Psychosocial & Spiritual  Assessment  Palliative Care Outcomes      Patient Active Problem List   Diagnosis Date Noted   Dysphagia    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   Palliative care by specialist    Goals of care, counseling/discussion    Acute encephalopathy 07/23/2019   Brain mass 07/23/2019   Coronary artery disease involving native coronary artery of native heart without angina pectoris    Cocaine use disorder, moderate, dependence (HCC) 09/01/2016   Major depressive disorder, recurrent severe without psychotic features (HCC) 08/31/2016   Alcohol use disorder, moderate, dependence (HCC) 08/31/2016   Suicidal ideation 08/31/2016   Noncompliance 08/31/2016   Tobacco use disorder 08/31/2016   Human immunodeficiency virus (HIV) disease (HCC) 08/31/2016   Hepatitis C 08/31/2016   Hepatitis B 08/31/2016    Palliative Care Plan    Recommendations/Plan:  Failure to thrive.  No longer eating, barely taking sips.    Is long term from [place] and could go back there with Hospice (not without hospice please)  He is also eligible to go to [facility].  Brother Henry Oconnell is calling me back today to give me his decision.  Goals of Care and Additional Recommendations:  Code Status:  DNR  Prognosis:   < 2 weeks without artificial hydration or feeding.     Discharge Planning:  To Be Determined  Care plan was discussed with family.  Thank you for allowing the Palliative Medicine Team to assist in the care of this patient.  Total time spent:  35 min.     Greater than 50%  of this time was spent counseling and coordinating care related to the above assessment and plan.  Norvel Richards, PA-C Palliative Medicine  Please contact Palliative MedicineTeam phone at [phone] for questions and concerns between 7 am - 7 pm.  Please see AMION for individual provider pager numbers.

## 2019-08-08 NOTE — TOC Transition Note (Signed)
Transition of Care [facility]) - CM/SW Discharge Note   Patient Details  Name: TOMOTHY EDDINS MRN: [id number] Date of Birth: [birth year]  [facility] CM/SW Contact:  Gelene Mink, [place] Phone Number: 08/08/2019, 10:18 AM   Clinical Narrative:     Patient will DC to: [facility] Anticipated DC date: 08/08/2019 Family notified: Yes Transport by: Lesle Reek has been called and scheduled for 11:30AM*  Per MD patient ready for DC to . RN, patient, patient's family, and facility notified of DC. Discharge Summary sent to facility. RN to call report prior to discharge [phone] Ask for patient's nurse). DC packet on chart. Ambulance transport requested for patient.   CSW will sign off for now as social work intervention is no longer needed. Please consult [place] again if new needs arise.  Kaniya Trueheart, LCSW-A [place]/Clinical Social Work Department Cell: [phone]    Final next level of care: [place] Barriers to Discharge: No Barriers Identified   Patient Goals and CMS Choice Patient states their goals for this hospitalization and ongoing recovery are:: Pt will transition to comfort care CMS Medicare.gov Compare Post Acute Care list provided to:: Patient Represenative (must comment) Choice offered to / list presented to : Sibling  Discharge Placement              Patient chooses bed at: Other - please specify in the comment section below:([facility]) Patient to be transferred to facility by: [place] Name of family member notified: Hulen Skains, brother5 Patient and family notified of of transfer: 08/08/19  Discharge Plan and Services In-house Referral: Clinical Social Work Discharge Planning Services: CM Consult Post Acute Care Choice: Hospice          DME Arranged: N/A DME Agency: NA       HH Arranged: NA HH Agency: NA        Social Determinants of Health (SDOH) Interventions     Readmission Risk  Interventions No flowsheet data found.

## 2019-08-08 NOTE — Discharge Summary (Signed)
. Physician Discharge Summary  Henry BorosJulius B Oconnell ZOX:[id number] DOB: 08/14/1951 DOA: 07/22/2019  PCP: Patient, No Pcp Per  Admit date: 07/22/2019 Discharge date: 08/08/2019  Admitted From: [facility] Disposition:  Discharged to hospice facility.  Recommendations for Outpatient Follow-up:  None  Discharge Condition: Guarded  CODE STATUS: DNR   Brief/Interim Summary: 68 year old male COPD/asthma VF arrest 2018 hep C, history of IV drug use, HIV noncompliant with antiretrovirals for over 7 months, NSTEMI Rx WF you 07/05/19/2020-10/14 2020-also prior heart attack 5 to 6 months prior to the symptoms in hospital for 2 weeks had Zio patch at that time was treated with heparin loaded with aspirin and told to continue meds was not placed on other meds on discharge because of poor compliance borderline bradycardia [facility] for acute encephalopathy-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 Infectious disease as well neurosurgery were consulted given areas in the brain showing these lesions--his serologies show toxoplasmosis and he was started on definitive treatment for the same It was felt that he became more somnolent and was starting to aspirate on 11/3 and antibiotics were adjusted to Flagyl on 11/5 and palliative care was reconsulted regarding plan of care  11/14: Family has elected hospice. [facility]  Discharge Diagnoses:  Principal Problem:   Toxoplasma meningoencephalitis (HCC) Active Problems:   Human immunodeficiency virus (HIV) disease (HCC)   Hepatitis C   Acute encephalopathy   Brain mass   Coronary artery disease involving native coronary artery of native heart without angina pectoris   Palliative care by specialist   Goals of care, counseling/discussion   Dysphagia  Toxic metabolic encephalopathy multifactorial secondary to toxoplasma encephalitis and aspiration  PNA Dysphagia Hypoglycemia HIV Untreated hepatitis C RPR positive Normocytic anemia (multifactorial) Hx of multiple NSTEMI Remote history of VF arrest 2018 Goals of Care/Advance Care  To continue from above, patient's encephalopathy never completely improved. He had a coretrak for feeding as there was concern for aspirations. He pulled it, and it was not replaced. Family elected pleasure feeds; however, his PO intake greatly declined. He was hypoglycemic and started on D5 fluids. At that point, family elected for full comfort care measures. [facility] will be transferred [facility] per their request.  Discharge Instructions   Allergies as of 08/08/2019      Reactions   Sulfamethoxazole Micro [sulfamethoxazole] Rash   Trimethoprim Rash      Medication List    STOP taking these medications   albuterol 108 (90 Base) MCG/ACT inhaler Commonly known as: VENTOLIN HFA   aspirin EC 81 MG tablet   atorvastatin 80 MG tablet Commonly known as: LIPITOR   Biktarvy 50-200-25 MG Tabs tablet Generic drug: bictegravir-emtricitabine-tenofovir AF   clopidogrel 75 MG tablet Commonly known as: PLAVIX   elvitegravir-cobicistat-emtricitabine-tenofovir 150-150-200-10 MG Tabs tablet Commonly known as: GENVOYA   hydrocortisone 25 MG suppository Commonly known as: ANUSOL-HC   megestrol 400 MG/10ML suspension Commonly known as: MEGACE   mirtazapine 15 MG tablet Commonly known as: REMERON       Allergies  Allergen Reactions  . Sulfamethoxazole Micro [Sulfamethoxazole] Rash  . Trimethoprim Rash    Consultations:  Neurosurgery  ID  Palliative Care   Procedures/Studies: Ct Head Wo Contrast  Result Date: 07/24/2019 CLINICAL DATA:  Encephalopathy. Concern for CNS lymphoma based on MRI. Follow-up hemorrhage. EXAM: CT HEAD WITHOUT CONTRAST TECHNIQUE: Contiguous axial images were obtained from the base of the skull  through the vertex without intravenous contrast.  COMPARISON:  07/22/2019 and 07/21/2019 as well as MRI brain 07/20/2019 FINDINGS: Brain: Ventricles and cisterns are within normal. Stable small left frontal hygroma. Persistent small focus of acute subarachnoid hemorrhage over the left frontoparietal convexity. Resolving tiny focus of acute hemorrhage over the region of the septum pellucidum. No new areas of hemorrhage. No significant mass, mass effect or shift of midline structures. Right basal ganglia calcification. No acute infarction. Vascular: No hyperdense vessel or unexpected calcification. Skull: Normal. Negative for fracture or focal lesion. Sinuses/Orbits: No acute finding. Other: None. IMPRESSION: 1. Stable small focus of acute subarachnoid hemorrhage over the left frontoparietal region. Resolving tiny focus of hemorrhage over the septum post and. No new areas of hemorrhage. 2.  Stable small left frontal hygroma. Electronically Signed   By: Elberta Fortis M.D.   On: 07/24/2019 07:40   Ct Chest W Contrast  Result Date: 07/24/2019 CLINICAL DATA:  Looking for lymphadenopathy.  HIV with brain masses. EXAM: CT CHEST, ABDOMEN, AND PELVIS WITH CONTRAST TECHNIQUE: Multidetector CT imaging of the chest, abdomen and pelvis was performed following the standard protocol during bolus administration of intravenous contrast. CONTRAST:  OMNIPAQUE IOHEXOL 300 MG/ML  SOLN COMPARISON:  Abdomen and pelvis CT 08/28/2016 FINDINGS: CT CHEST FINDINGS Cardiovascular: Normal heart size. No pericardial effusion. Multifocal coronary calcification. Aortic atherosclerosis. Mediastinum/Nodes: Negative for adenopathy. Lungs/Pleura: Mild paraseptal emphysema. Mild patchy reticular opacity likely reflecting atelectasis. No consolidation, edema, effusion, or pneumothorax. Musculoskeletal: No acute finding. Posterior subluxation of the right sternoclavicular joint. CT ABDOMEN PELVIS FINDINGS Hepatobiliary: Hypervascular central hepatic lesion measuring 14 mm, stable and consistent  with flash fill hemangioma given the long interval and blood pool on delayed phase. A similarly enhancing nodule is seen in the lateral segment left lobe at 12 mm, also stable. Wedge-shaped areas of geographic perfusion anomaly in the sub capsular right liver without evident underlying mass, attributed to transient perfusion anomalies. No detected cirrhotic changes in this patient with history of hepatitis C.No evidence of biliary obstruction or stone. Pancreas: Unremarkable. Spleen: Unremarkable. Adrenals/Urinary Tract: Negative adrenals. No hydronephrosis or ureteral stone. Bilateral hilar calcifications appear atherosclerotic. Unremarkable bladder. Stomach/Bowel:  No obstruction. No evidence of inflammation. 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 No mass or adenopathy. Reproductive:Negative Other: No ascites or pneumoperitoneum. Musculoskeletal: No acute abnormalities. IMPRESSION: 1. No evidence of primary malignancy or metastatic disease. No evidence of opportunistic infection. 2. 2 hypervascular liver lesions that are stable from 2017 and consistent with hemangioma. 3. Aortic aneurysm NOS (ICD10-I71.9). If appropriate for comorbidities, recommend followup by ultrasound in 3 years. This recommendation follows ACR consensus guidelines: White Paper of the ACR Incidental Findings Committee II on Vascular Findings. J Am Coll Radiol 2013; 16:109-604 4.  Aortic Atherosclerosis (ICD10-I70.0). Electronically Signed   By: Marnee Spring M.D.   On: 07/24/2019 07:42   Ct Abdomen Pelvis W Contrast  Result Date: 07/24/2019 CLINICAL DATA:  Looking for lymphadenopathy.  HIV with brain masses. EXAM: CT CHEST, ABDOMEN, AND PELVIS WITH CONTRAST TECHNIQUE: Multidetector CT imaging of the chest, abdomen and pelvis was performed following the standard protocol during bolus administration of intravenous contrast. CONTRAST:  OMNIPAQUE  IOHEXOL 300 MG/ML  SOLN COMPARISON:  Abdomen and pelvis CT 08/28/2016 FINDINGS: CT CHEST FINDINGS Cardiovascular: Normal heart size. No pericardial effusion. Multifocal coronary calcification. Aortic atherosclerosis. Mediastinum/Nodes: Negative for adenopathy. Lungs/Pleura: Mild paraseptal emphysema. Mild patchy reticular opacity likely reflecting atelectasis. No consolidation, edema, effusion,  or pneumothorax. Musculoskeletal: No acute finding. Posterior subluxation of the right sternoclavicular joint. CT ABDOMEN PELVIS FINDINGS Hepatobiliary: Hypervascular central hepatic lesion measuring 14 mm, stable and consistent with flash fill hemangioma given the long interval and blood pool on delayed phase. A similarly enhancing nodule is seen in the lateral segment left lobe at 12 mm, also stable. Wedge-shaped areas of geographic perfusion anomaly in the sub capsular right liver without evident underlying mass, attributed to transient perfusion anomalies. No detected cirrhotic changes in this patient with history of hepatitis C.No evidence of biliary obstruction or stone. Pancreas: Unremarkable. Spleen: Unremarkable. Adrenals/Urinary Tract: Negative adrenals. No hydronephrosis or ureteral stone. Bilateral hilar calcifications appear atherosclerotic. Unremarkable bladder. Stomach/Bowel:  No obstruction. No evidence of inflammation. 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 No mass or adenopathy. Reproductive:Negative Other: No ascites or pneumoperitoneum. Musculoskeletal: No acute abnormalities. IMPRESSION: 1. No evidence of primary malignancy or metastatic disease. No evidence of opportunistic infection. 2. 2 hypervascular liver lesions that are stable from 2017 and consistent with hemangioma. 3. Aortic aneurysm NOS (ICD10-I71.9). If appropriate for comorbidities, recommend followup by ultrasound in 3 years. This recommendation follows  ACR consensus guidelines: White Paper of the ACR Incidental Findings Committee II on Vascular Findings. J Am Coll Radiol 2013; 61:443-154 4.  Aortic Atherosclerosis (ICD10-I70.0). Electronically Signed   By: Marnee Spring M.D.   On: 07/24/2019 07:42   Dg Chest Port 1 View  Result Date: 07/30/2019 CLINICAL DATA:  Follow-up pneumonia EXAM: PORTABLE CHEST 1 VIEW COMPARISON:  07/28/2019 FINDINGS: No significant interval change in bilateral heterogeneous pulmonary opacity, most conspicuous at the right lung base, and small, layering bilateral pleural effusions. No new airspace opacity. Cardiomegaly. Esophagogastric tube with tip and side port below the diaphragm. IMPRESSION: No significant interval change in bilateral heterogeneous pulmonary opacity, most conspicuous at the right lung base, and small, layering bilateral pleural effusions. Findings remain consistent with multifocal infection. No new airspace opacity. Electronically Signed   By: Lauralyn Primes M.D.   On: 07/30/2019 09:21   Dg Chest Port 1 View  Result Date: 07/28/2019 CLINICAL DATA:  Abnormal breath sounds. History of hypertension, COPD, asthma, HIV. EXAM: PORTABLE CHEST 1 VIEW COMPARISON:  CT 07/24/2019.  Chest x-ray 07/23/2019. FINDINGS: NG tube noted with tip below left hemidiaphragm. Cardiomegaly with bilateral pulmonary infiltrates/edema and bilateral pleural effusions. Findings most consistent with congestive heart failure. No pneumothorax. IMPRESSION: 1.  NG tube noted with tip below left hemidiaphragm. 2. Cardiomegaly with bilateral pulmonary infiltrates/edema bilateral pleural effusions. Findings most consistent with congestive heart failure. Electronically Signed   By: Maisie Fus  Register   On: 07/28/2019 12:52   Dg Chest Port 1 View  Result Date: 07/23/2019 CLINICAL DATA:  Altered mental status. EXAM: PORTABLE CHEST 1 VIEW COMPARISON:  July 19, 2019. FINDINGS: The heart size and mediastinal contours are within normal limits. Both  lungs are clear. No pneumothorax or pleural effusion is noted. The visualized skeletal structures are unremarkable. IMPRESSION: No active disease. Electronically Signed   By: Lupita Raider M.D.   On: 07/23/2019 09:20   Dg Abd Portable 1v  Result Date: 07/25/2019 CLINICAL DATA:  Gastric tube placement. EXAM: PORTABLE ABDOMEN - 1 VIEW COMPARISON:  Film earlier today. FINDINGS: Gastric decompression tube extends into the proximal stomach with the tip located in the fundus. Visualized bowel gas pattern is unremarkable. IMPRESSION: Gastric decompression tube extends into the proximal stomach with the tip located in the fundus. Electronically Signed  By: Irish Lack M.D.   On: 07/25/2019 14:59   Dg Abd Portable 1v  Result Date: 07/25/2019 CLINICAL DATA:  NG tube placement EXAM: PORTABLE ABDOMEN - 1 VIEW COMPARISON:  Plain film from earlier same day. FINDINGS: Enteric tube has been advanced with proximal side holes now well positioned in the stomach. Visualized bowel gas pattern is nonobstructive. No evidence of free intraperitoneal air. IMPRESSION: 1. Enteric tube has been advanced and is now well positioned in the stomach. 2. Nonobstructive bowel gas pattern. Electronically Signed   By: Bary Richard M.D.   On: 07/25/2019 11:51   Dg Abd Portable 1v  Result Date: 07/25/2019 CLINICAL DATA:  Encounter for feeding tube placement. EXAM: PORTABLE ABDOMEN - 1 VIEW COMPARISON:  Abdominal CT 07/24/2019 FINDINGS: Nasogastric tube has been placed. The tip of the tube is in the left upper abdomen and proximal stomach region. Tube side hole is near the GE junction. Lung bases are clear. Nonobstructive bowel gas pattern. IMPRESSION: Nasogastric tube tip is in the proximal stomach region. Side hole of the nasogastric tube is probably in the distal esophagus. Electronically Signed   By: Richarda Overlie M.D.   On: 07/25/2019 10:32      Subjective: No acute events ON per nursing.  Discharge Exam: Vitals:    08/07/19 1140 08/07/19 1926  BP: 119/68 128/82  Pulse: 78 78  Resp: 16 18  Temp: 98.9 F (37.2 C) 98.1 F (36.7 C)  SpO2: 96% 100%   Vitals:   08/07/19 0424 08/07/19 0755 08/07/19 1140 08/07/19 1926  BP: 106/65 109/61 119/68 128/82  Pulse: 84 71 78 78  Resp: Temp: 98.1 F (36.7 C) 98.3 F (36.8 C) 98.9 F (37.2 C) 98.1 F (36.7 C)  TempSrc: Oral Oral Oral Oral  SpO2: 97% 98% 96% 100%    General: 68 y.o. ill appearing male resting in bed in NAD Cardiovascular: RRR, +S1, S2, no m/g/r, equal pulses throughout Respiratory: clear anteriorly, normal WOB GI: BS+, NDNT, soft MSK: No e/c/c Neuro: somnolent   The results of significant diagnostics from this hospitalization (including imaging, microbiology, ancillary and laboratory) are listed below for reference.     Microbiology: No results found for this or any previous visit (from the past 240 hour(s)).   Labs: BNP (last 3 results) Recent Labs    07/28/19 1112  BNP 186.1*   Basic Metabolic Panel: Recent Labs  Lab 08/03/19 1024 08/06/19 1001  NA 135 134*  K 3.6 3.7  CL 108 106  CO2 19* 19*  GLUCOSE 102* 83  BUN 9 13  CREATININE 0.80 1.00  CALCIUM 9.1 9.1  MG  --  1.8  PHOS 3.0 3.7   Liver Function Tests: Recent Labs  Lab 08/03/19 1024 08/06/19 1001  ALBUMIN 3.4* 3.4*   No results for input(s): LIPASE, AMYLASE in the last 168 hours. No results for input(s): AMMONIA in the last 168 hours. CBC: Recent Labs  Lab 08/03/19 1024 08/06/19 1001  WBC 4.4 10.6*  NEUTROABS 2.0 6.9  HGB 8.4* 7.5*  HCT 24.3* 22.7*  MCV 93.8 98.7  PLT 292 322   Cardiac Enzymes: No results for input(s): CKTOTAL, CKMB, CKMBINDEX, TROPONINI in the last 168 hours. BNP: Invalid input(s): POCBNP CBG: Recent Labs  Lab 08/06/19 2016 08/07/19 0015 08/07/19 0425 08/07/19 0744 08/07/19 1138  GLUCAP 70 119* 95 93 106*   D-Dimer No results for input(s): DDIMER in the last 72 hours. Hgb A1c No results for  input(s): HGBA1C  in the last 72 hours. Lipid Profile No results for input(s): CHOL, HDL, LDLCALC, TRIG, CHOLHDL, LDLDIRECT in the last 72 hours. Thyroid function studies No results for input(s): TSH, T4TOTAL, T3FREE, THYROIDAB in the last 72 hours.  Invalid input(s): FREET3 Anemia work up No results for input(s): VITAMINB12, FOLATE, FERRITIN, TIBC, IRON, RETICCTPCT in the last 72 hours. Urinalysis    Component Value Date/Time   COLORURINE STRAW (A) 09/03/2016 2220   APPEARANCEUR CLEAR (A) 09/03/2016 2220   LABSPEC 1.002 (L) 09/03/2016 2220   PHURINE 8.0 09/03/2016 2220   GLUCOSEU NEGATIVE 09/03/2016 2220   HGBUR NEGATIVE 09/03/2016 2220   BILIRUBINUR NEGATIVE 09/03/2016 2220   KETONESUR NEGATIVE 09/03/2016 2220   PROTEINUR NEGATIVE 09/03/2016 2220   NITRITE NEGATIVE 09/03/2016 2220   LEUKOCYTESUR NEGATIVE 09/03/2016 2220   Sepsis Labs Invalid input(s): PROCALCITONIN,  WBC,  LACTICIDVEN Microbiology No results found for this or any previous visit (from the past 240 hour(s)).   Time coordinating discharge: 35 minutes  SIGNED:   Jonnie Finner, DO  [facility] 08/08/2019, 7:17 AM Pager   If 7PM-7AM, please contact night-coverage [email or web address] Password [email or web address]

## 2019-08-08 NOTE — Progress Notes (Signed)
Gave report to Hospice nurse and currently awaiting for [facility] fir transport.

## 2019-08-25 DEATH — deceased

## 2019-12-09 IMAGING — DX DG CHEST 1V PORT
1 series · 1 of 1 positions shown · non-contrast
Comparison: July 19, 2019.

CLINICAL DATA: Altered mental status.

EXAM:
PORTABLE CHEST 1 VIEW

[chest]
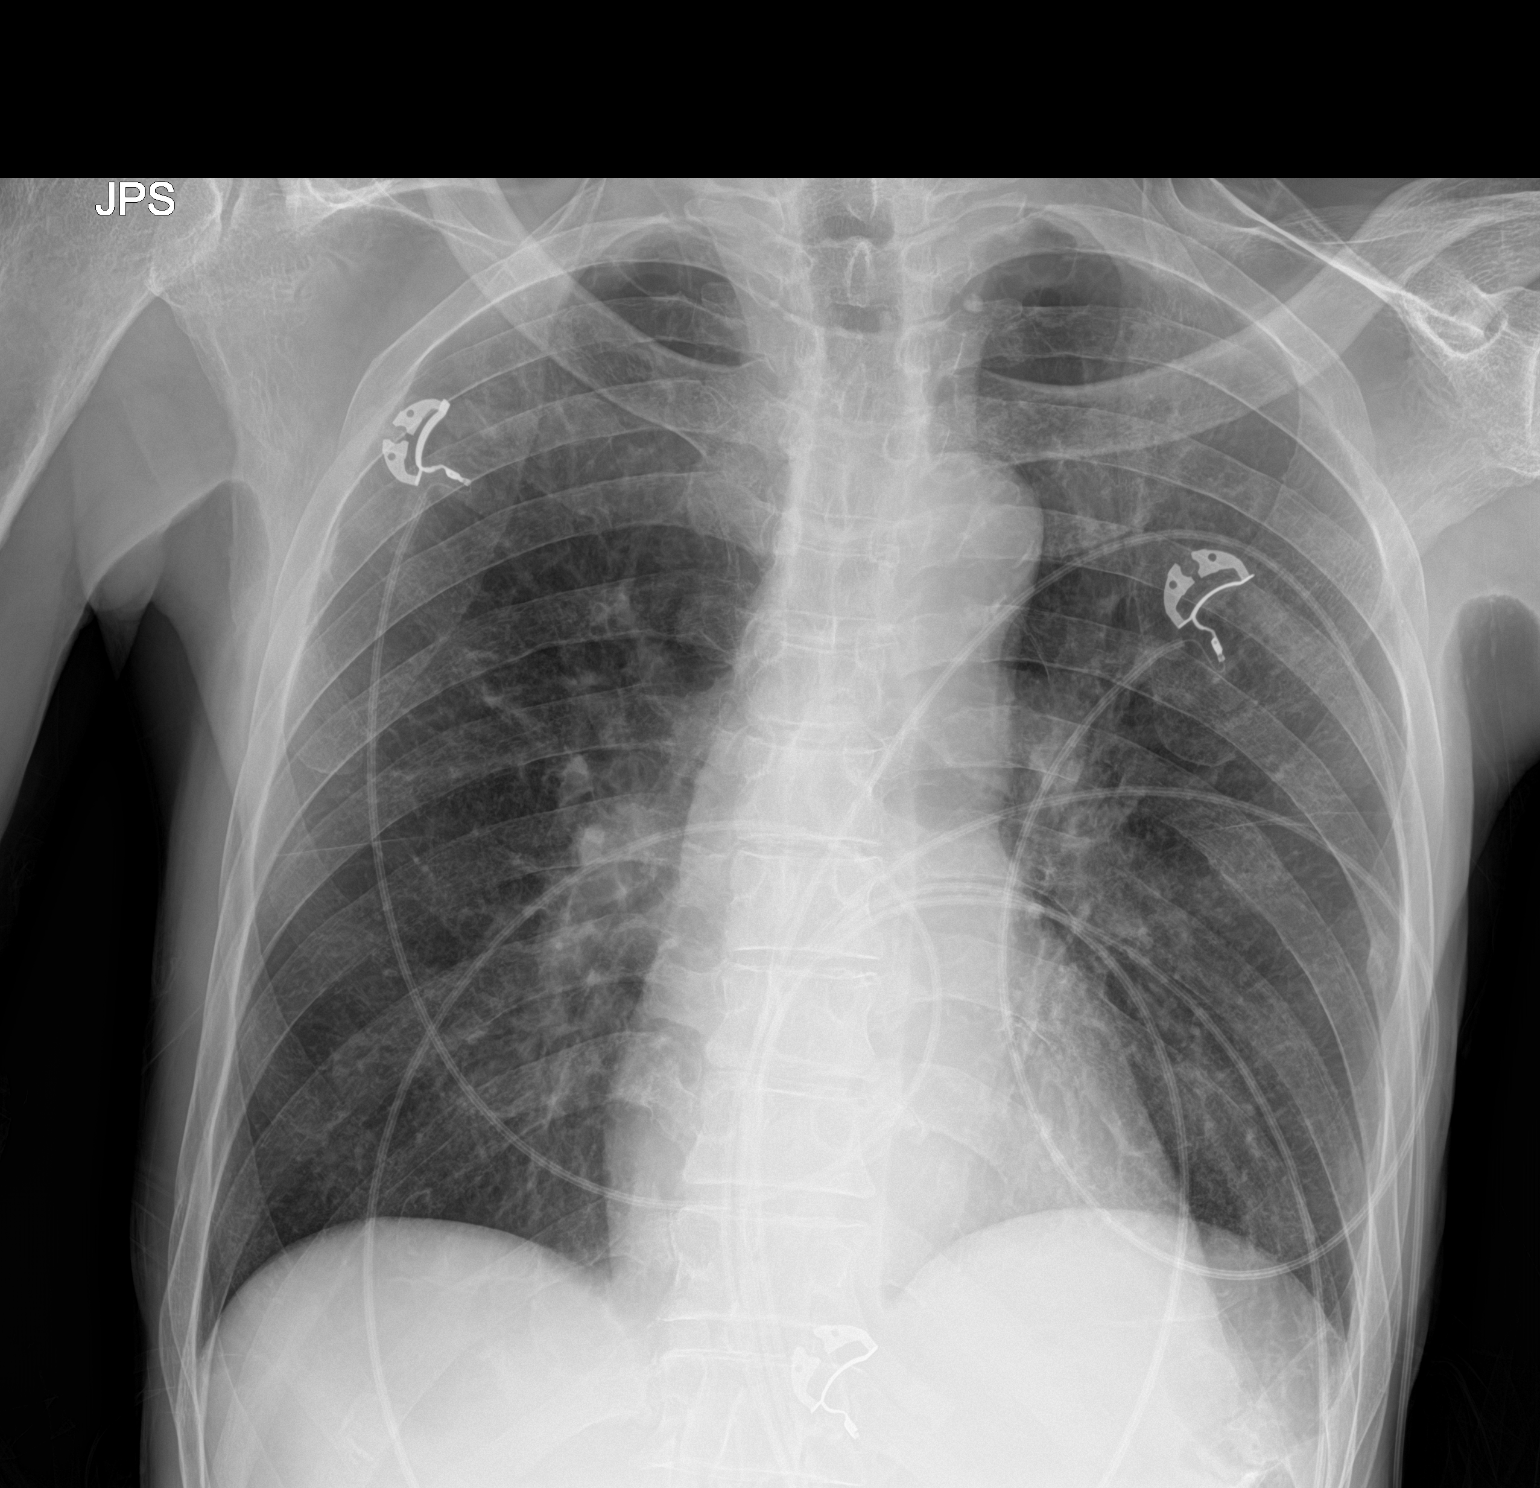

[1 of 1 positions shown; findings below may reference images not displayed]

FINDINGS: The heart size and mediastinal contours are within normal limits.
Both lungs are clear. No pneumothorax or pleural effusion is noted.
The visualized skeletal structures are unremarkable.
IMPRESSION: No active disease.

## 2019-12-10 IMAGING — CT CT HEAD W/O CM
4 series · 16 of 47 positions shown, 18 images · non-contrast
Comparison: 07/22/2019 and 07/21/2019 as well as MRI brain
07/20/2019

CLINICAL DATA: Encephalopathy. Concern for CNS lymphoma based on
MRI. Follow-up hemorrhage.

EXAM:
CT HEAD WITHOUT CONTRAST
TECHNIQUE: Contiguous axial images were obtained from the base of the skull
through the vertex without intravenous contrast.

[Series 3: head wo · axial · 0.44mm/px · z∈[-100,+20]mm · 7 of 32 slices shown, 9 images]
[im 4/32  brain]
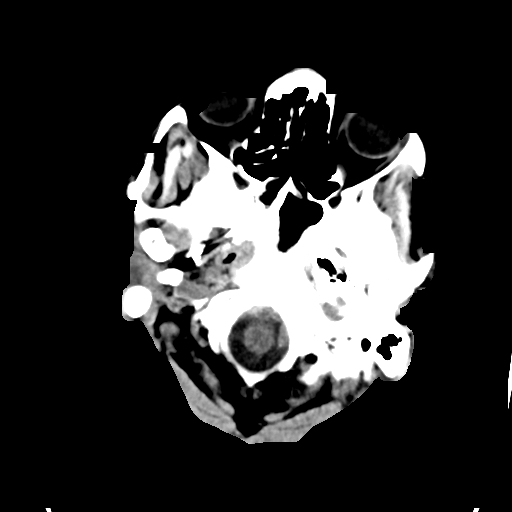
[im 4/32  bone]
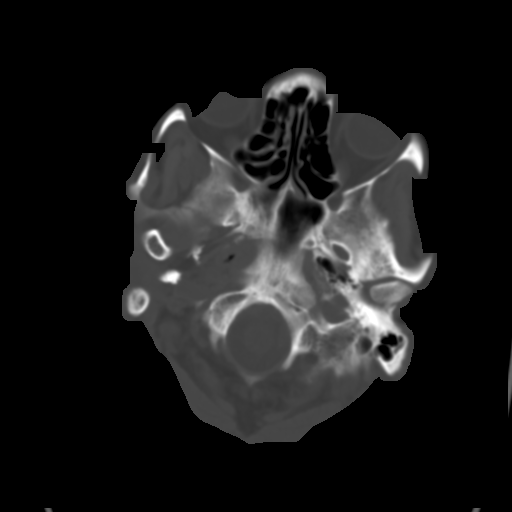
[im 8/32  brain]
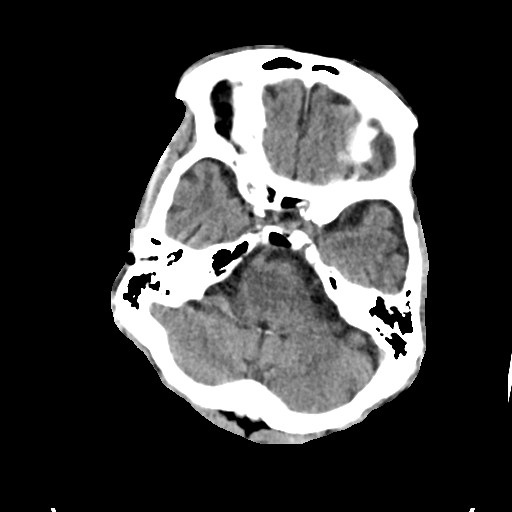
[im 12/32  brain]
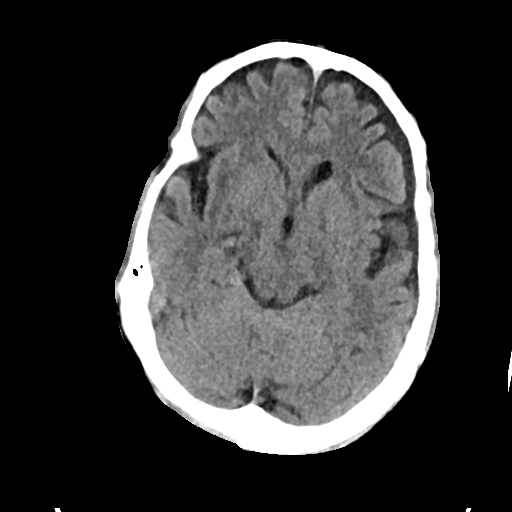
[im 16/32  brain]
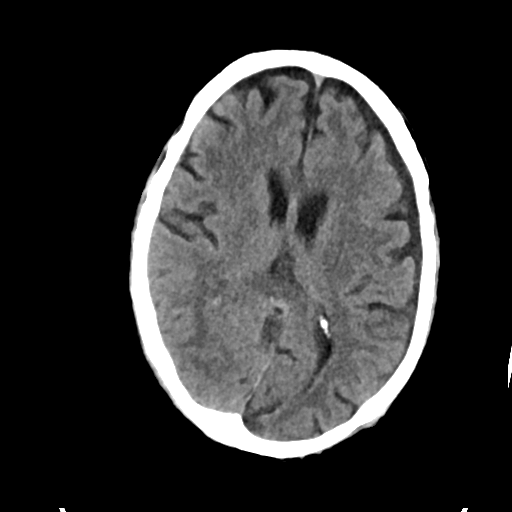
[im 20/32  brain]
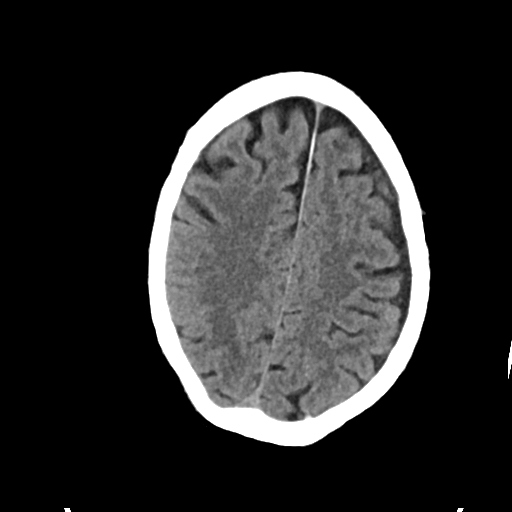
[im 20/32  bone]
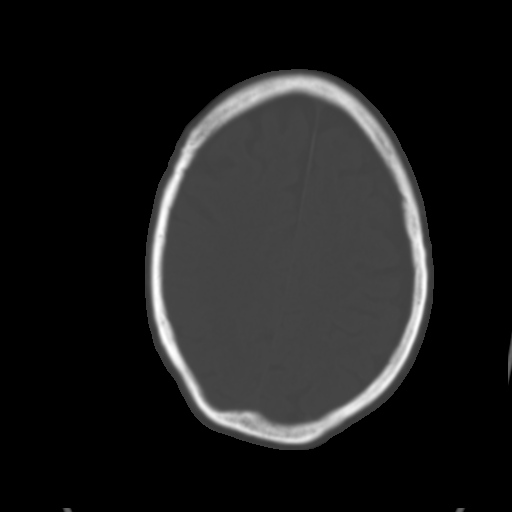
[im 24/32  brain]
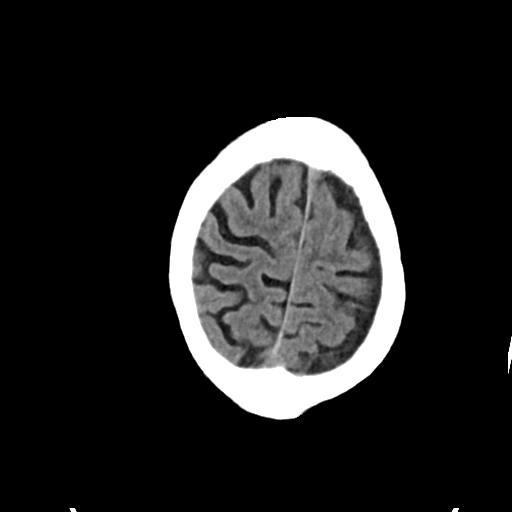
[im 28/32  brain]
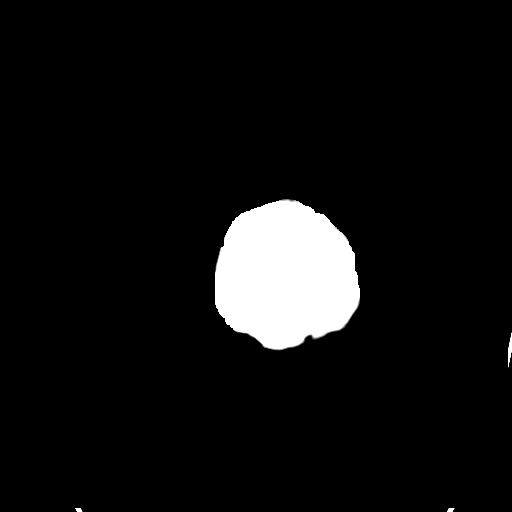

[Series 4: head bone · axial · 0.44mm/px · z∈[-101,-69]mm · 3 of 79 slices shown]
[im 8/79  bone]
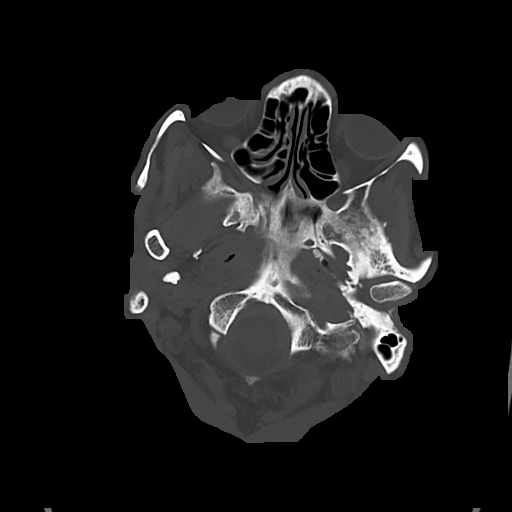
[im 16/79  bone]
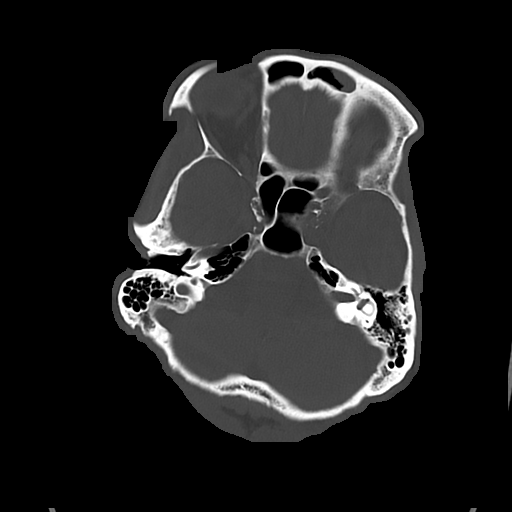
[im 24/79  bone]
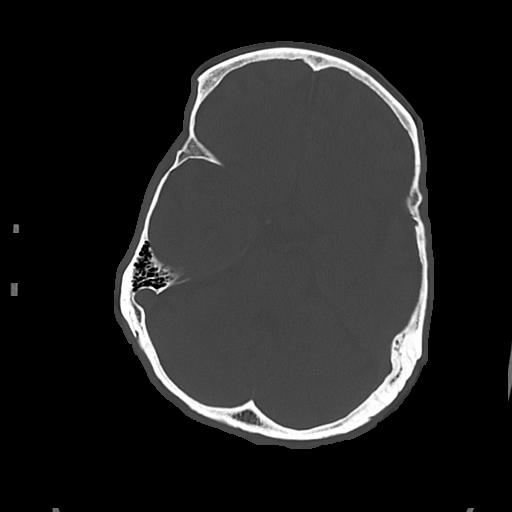

[Series 5: cor soft · coronal · 0.32mm/px · 3 of 69 slices shown]
[im 23/69  brain]
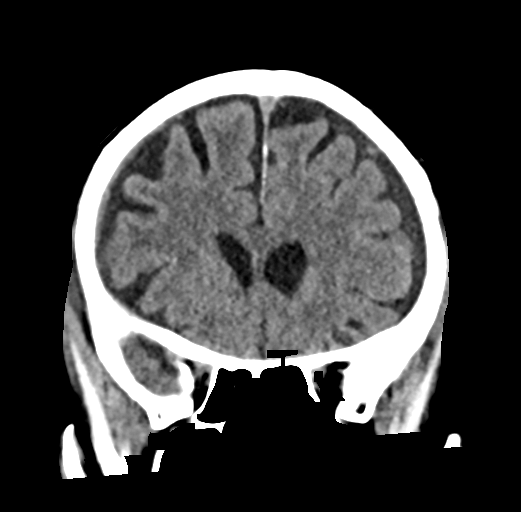
[im 31/69  brain]
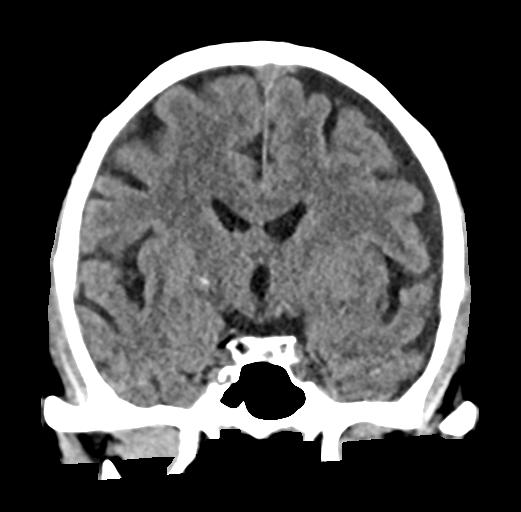
[im 38/69  brain]
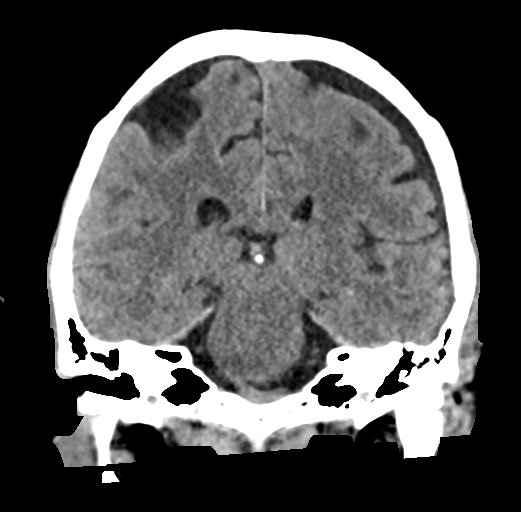

[Series 6: sag soft · sagittal · 0.33mm/px · 3 of 61 slices shown]
[im 21/61  brain]
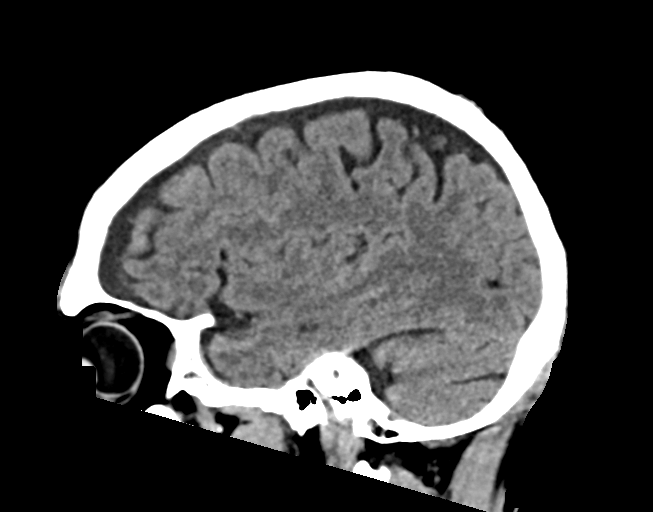
[im 31/61  brain]
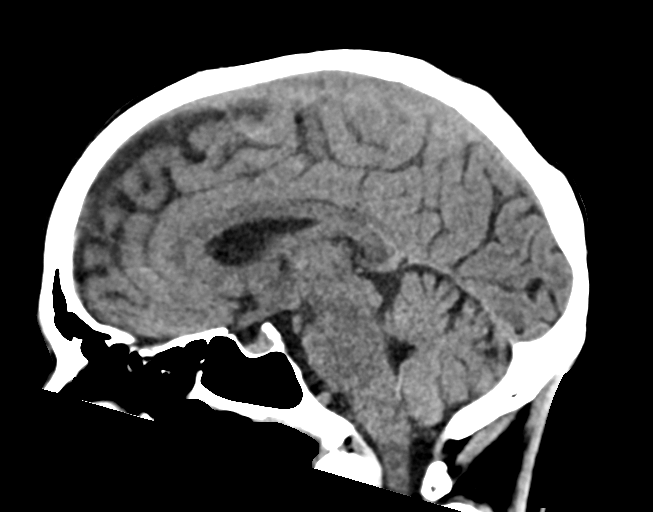
[im 41/61  brain]
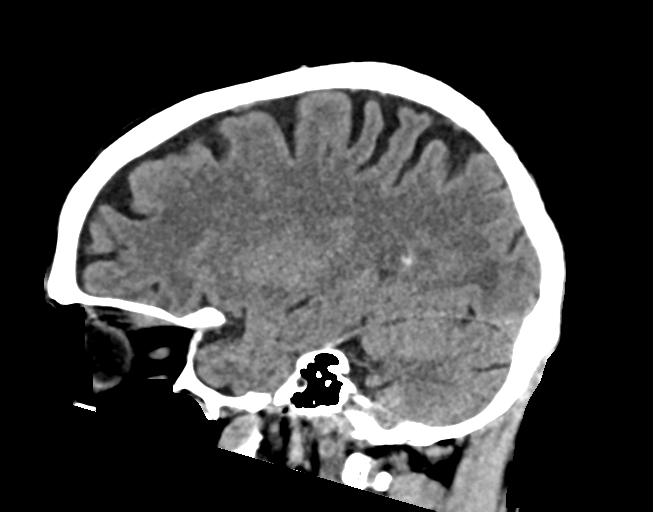

[16 of 47 positions shown; findings below may reference images not displayed]

FINDINGS: Brain: Ventricles and cisterns are within normal. Stable small left
frontal hygroma. Persistent small focus of acute subarachnoid
hemorrhage over the left frontoparietal convexity. Resolving tiny
focus of acute hemorrhage over the region of the septum pellucidum.
No new areas of hemorrhage. No significant mass, mass effect or
shift of midline structures. Right basal ganglia calcification. No
acute infarction.

Vascular: No hyperdense vessel or unexpected calcification.

Skull: Normal. Negative for fracture or focal lesion.

Sinuses/Orbits: No acute finding.

Other: None.
IMPRESSION: 1. Stable small focus of acute subarachnoid hemorrhage over the left
frontoparietal region. Resolving tiny focus of hemorrhage over the
septum post and. No new areas of hemorrhage.

2.  Stable small left frontal hygroma.

## 2019-12-10 IMAGING — CT CT CHEST W/ CM
2 of 5 series · 13 of 36 positions shown, 16 images · IV contrast (omnipaque)
Comparison: Abdomen and pelvis CT 08/28/2016

CLINICAL DATA: Looking for lymphadenopathy.  HIV with brain masses.

EXAM:
CT CHEST, ABDOMEN, AND PELVIS WITH CONTRAST
TECHNIQUE: Multidetector CT imaging of the chest, abdomen and pelvis was
performed following the standard protocol during bolus
administration of intravenous contrast.
CONTRAST:  100mL OMNIPAQUE IOHEXOL 300 MG/ML  SOLN

[Series 3: cap with · axial · 0.57mm/px · z∈[-779,-239]mm · 10 of 134 slices shown, 13 images]
[im 13/134  mediastinal]
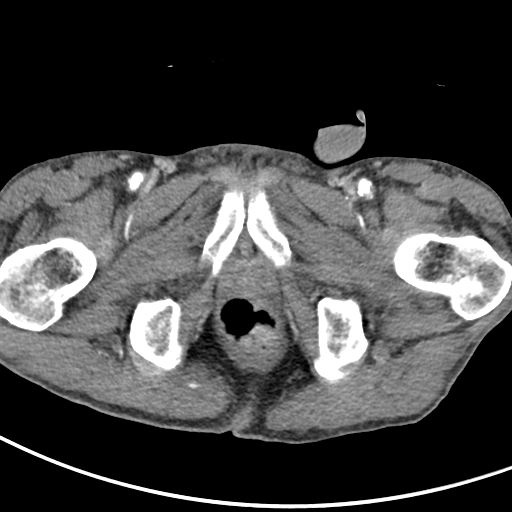
[im 13/134  lung]
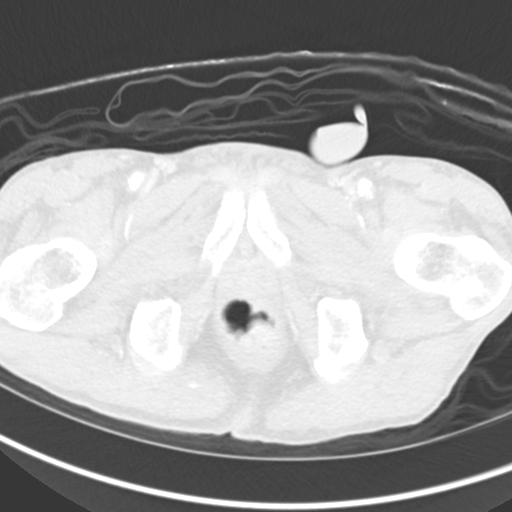
[im 25/134  lung]
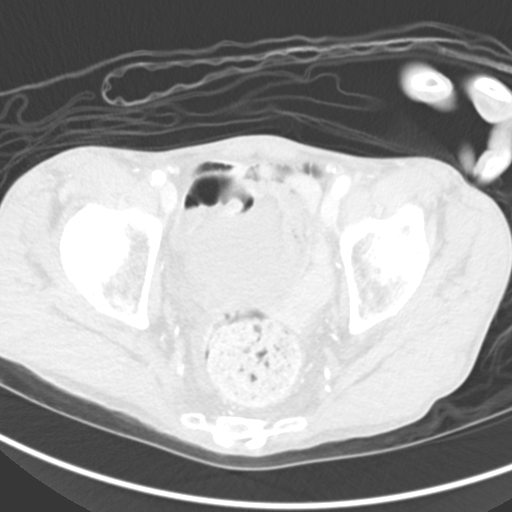
[im 37/134  lung]
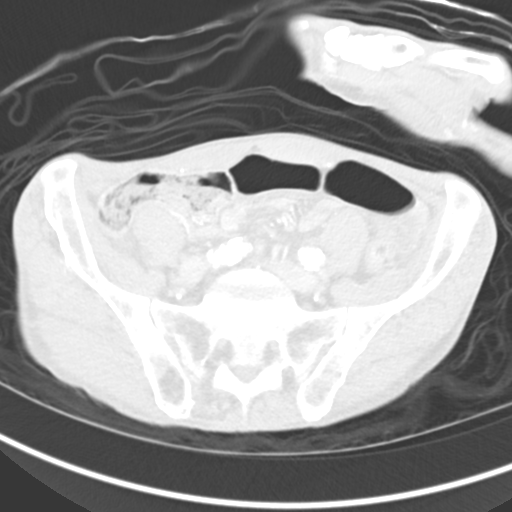
[im 49/134  lung]
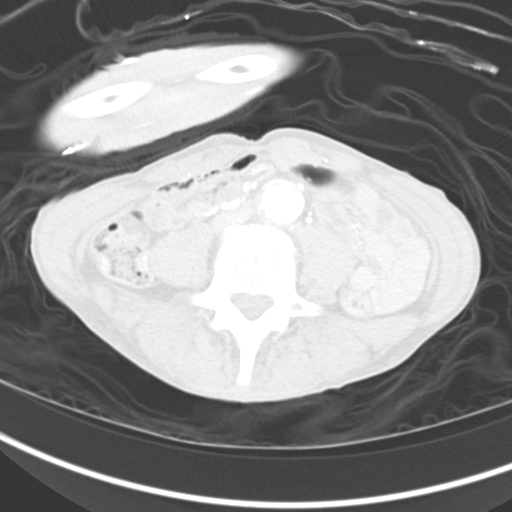
[im 61/134  mediastinal]
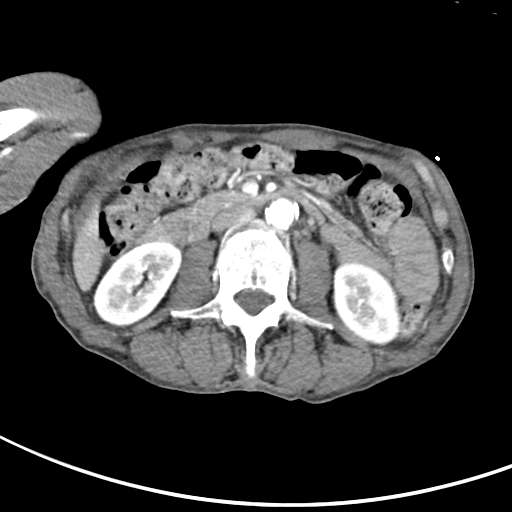
[im 61/134  lung]
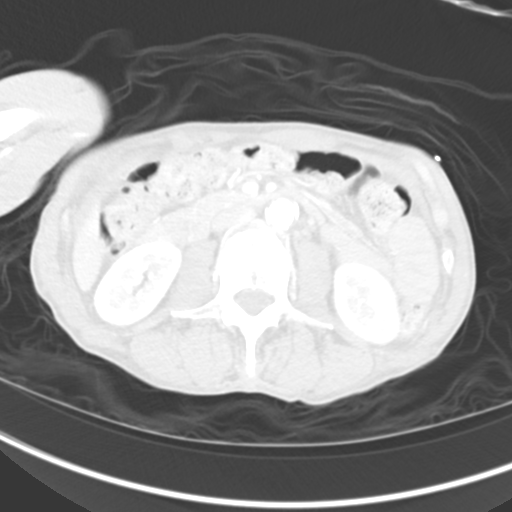
[im 73/134  lung]
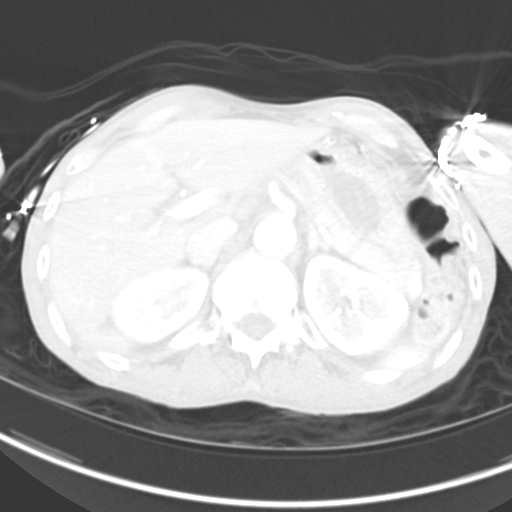
[im 85/134  lung]
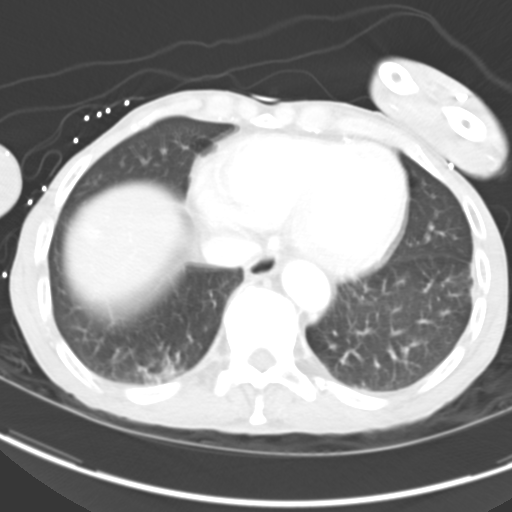
[im 97/134  lung]
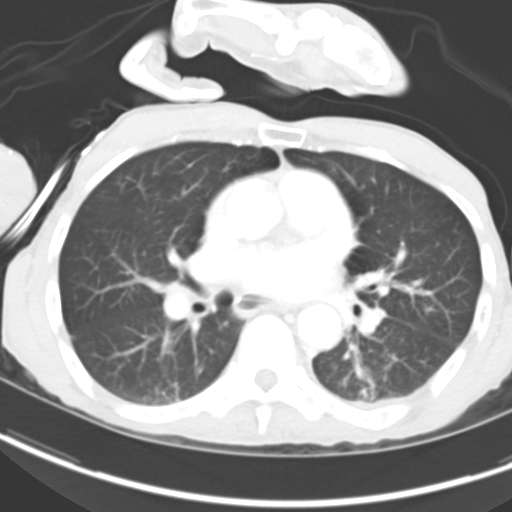
[im 109/134  mediastinal]
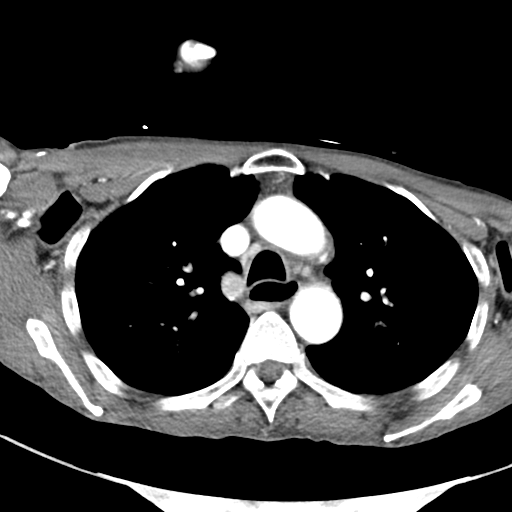
[im 109/134  lung]
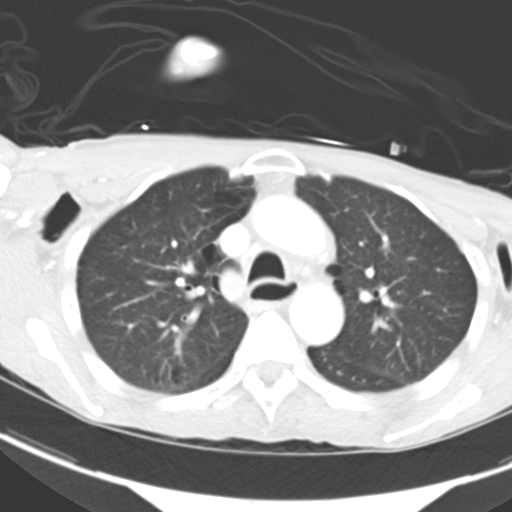
[im 121/134  lung]
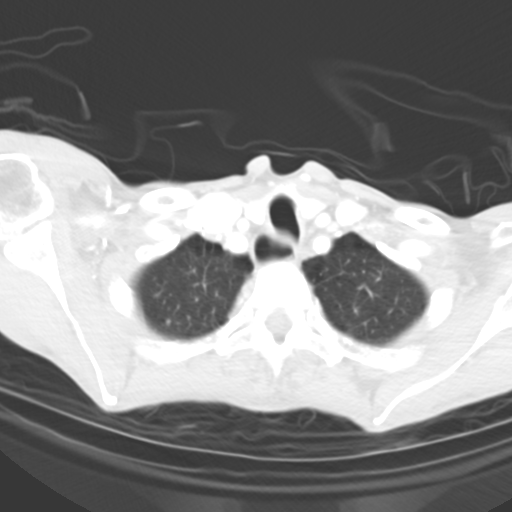

[Series 6: cor · coronal · 0.69mm/px · 3 of 68 slices shown]
[im 14/68  lung]
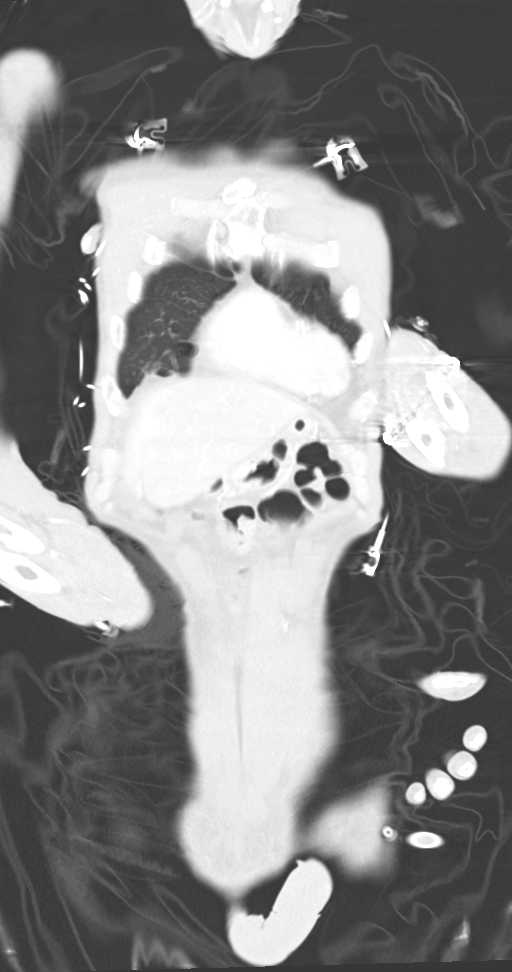
[im 27/68  lung]
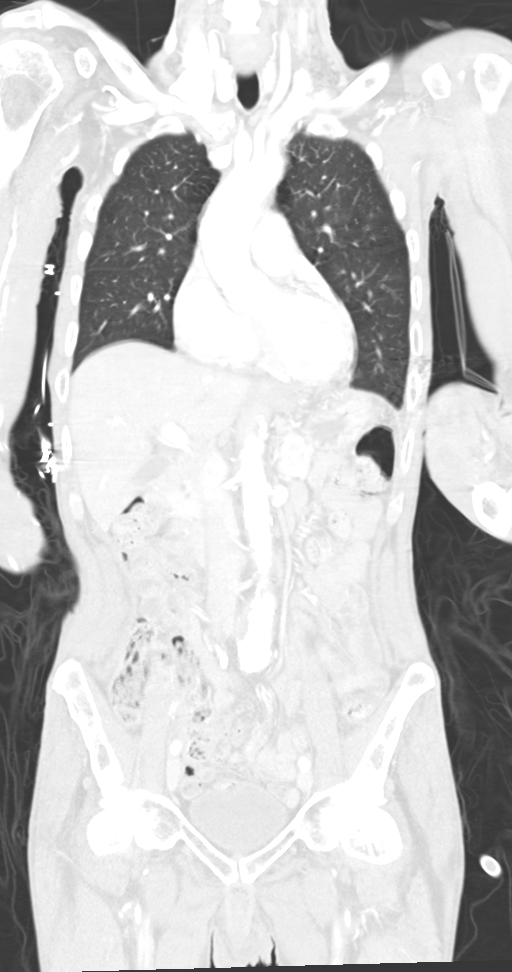
[im 41/68  lung]
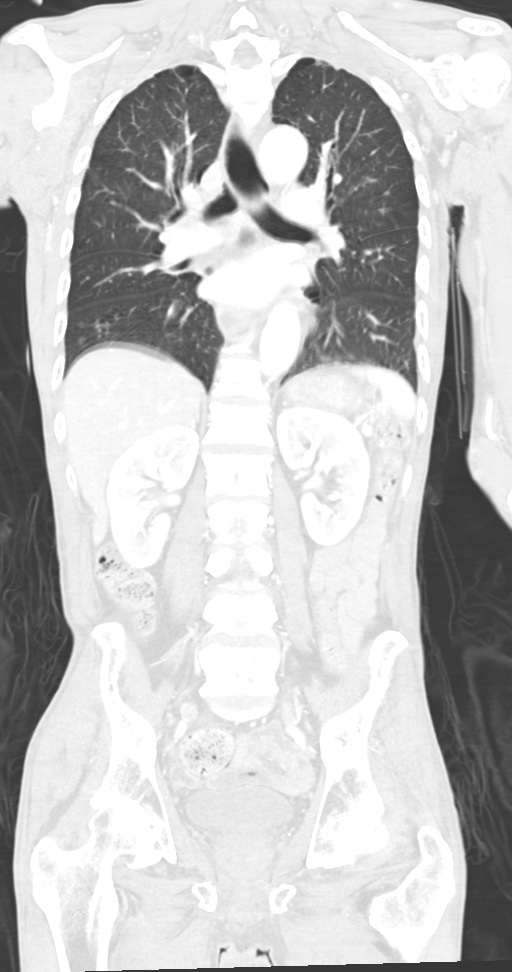

[13 of 36 positions shown; findings below may reference images not displayed]

FINDINGS: CT CHEST FINDINGS

Cardiovascular: Normal heart size. No pericardial effusion.
Multifocal coronary calcification. Aortic atherosclerosis.

Mediastinum/Nodes: Negative for adenopathy.

Lungs/Pleura: Mild paraseptal emphysema. Mild patchy reticular
opacity likely reflecting atelectasis. No consolidation, edema,
effusion, or pneumothorax.

Musculoskeletal: No acute finding. Posterior subluxation of the
right sternoclavicular joint.

CT ABDOMEN PELVIS FINDINGS

Hepatobiliary: Hypervascular central hepatic lesion measuring 14 mm,
stable and consistent with flash fill hemangioma given the long
interval and blood pool on delayed phase. A similarly enhancing
nodule is seen in the lateral segment left lobe at 12 mm, also
stable. Wedge-shaped areas of geographic perfusion anomaly in the
sub capsular right liver without evident underlying mass, attributed
to transient perfusion anomalies. No detected cirrhotic changes in
this patient with history of hepatitis C.No evidence of biliary
obstruction or stone.

Pancreas: Unremarkable.

Spleen: Unremarkable.

Adrenals/Urinary Tract: Negative adrenals. No hydronephrosis or
ureteral stone. Bilateral hilar calcifications appear
atherosclerotic. Unremarkable bladder.

Stomach/Bowel:  No obstruction. No evidence of inflammation.

Vascular/Lymphatic: Extensive atherosclerotic plaque with fusiform
infrarenal aorta aneurysm measuring up to 3 cm in diameter. No mass
or adenopathy.

Reproductive:Negative

Other: No ascites or pneumoperitoneum.

Musculoskeletal: No acute abnormalities.
IMPRESSION: 1. No evidence of primary malignancy or metastatic disease. No
evidence of opportunistic infection.
2. 2 hypervascular liver lesions that are stable from 9582 and
consistent with hemangioma.
3. Aortic aneurysm NOS (ZXOHO-22V.8). If appropriate for
comorbidities, recommend followup by ultrasound in 3 years. This
recommendation follows ACR consensus guidelines: White Paper of the
ACR Incidental Findings Committee II on Vascular Findings. [HOSPITAL] 5112; [DATE]
4.  Aortic Atherosclerosis (ZXOHO-JVL.L).

## 2019-12-11 IMAGING — DX DG ABD PORTABLE 1V
1 series · 1 of 1 positions shown · non-contrast
Comparison: Plain film from earlier same day.

CLINICAL DATA: NG tube placement

EXAM:
PORTABLE ABDOMEN - 1 VIEW

[abdomen kub]
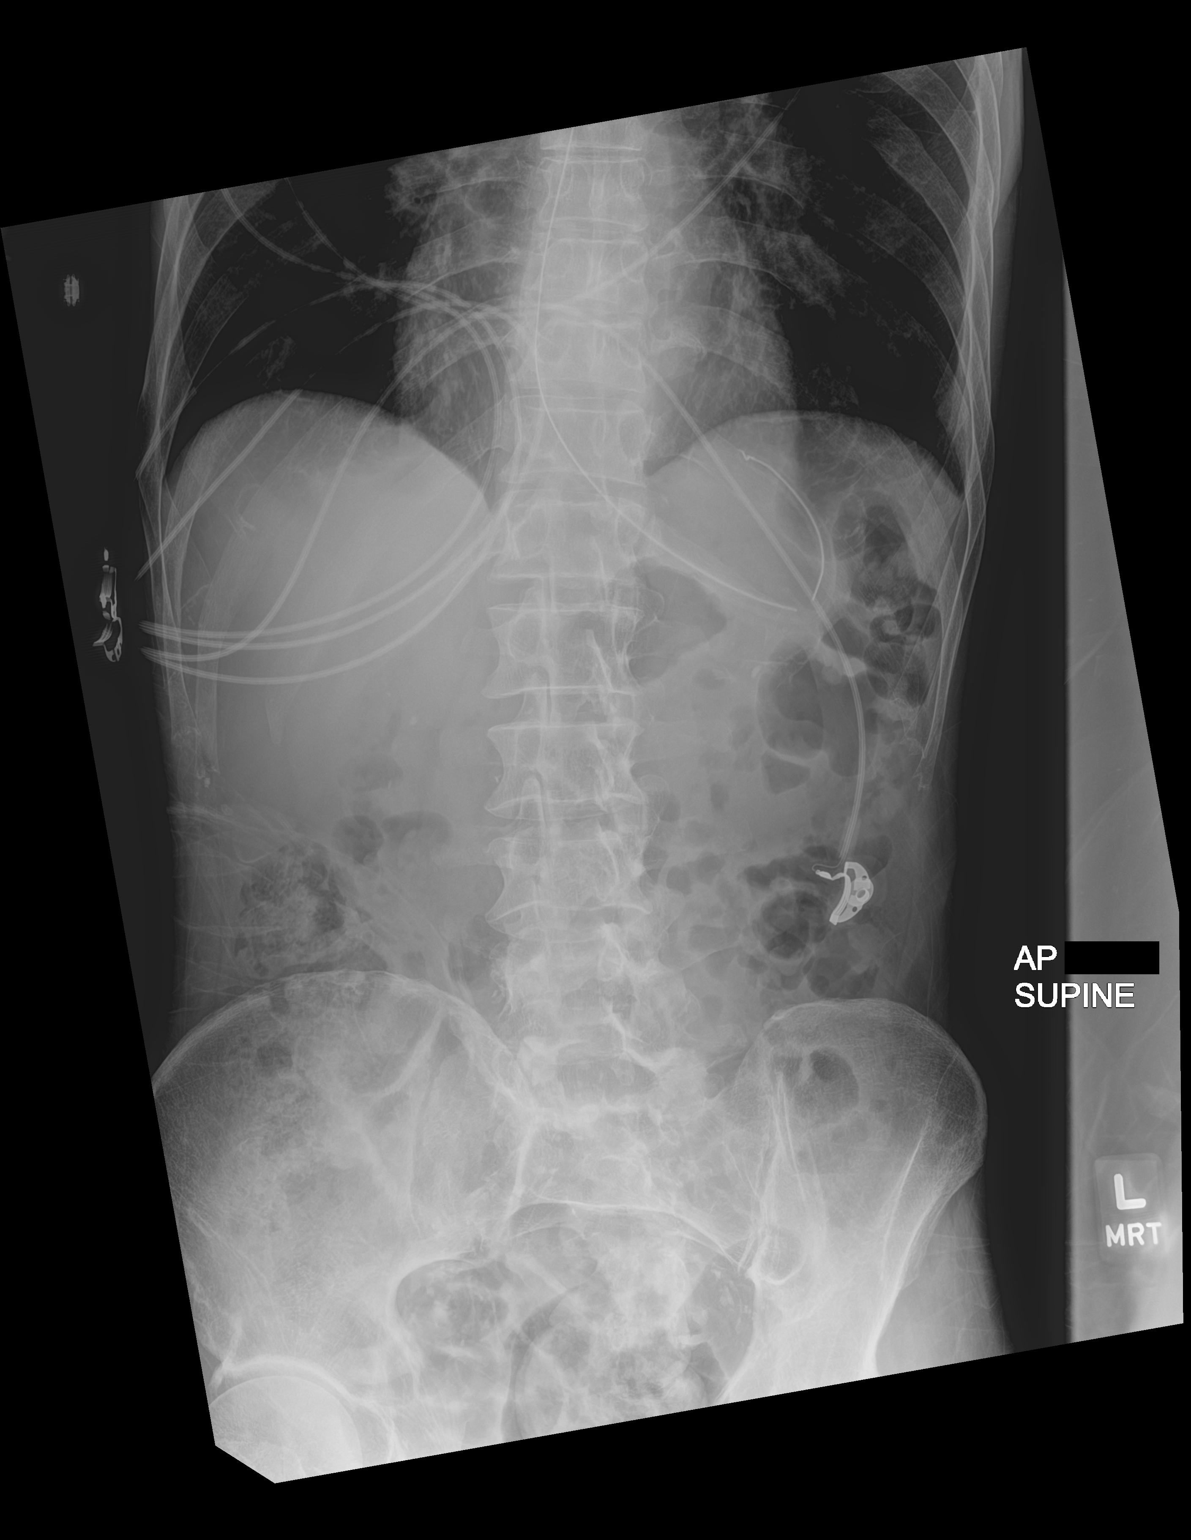

[1 of 1 positions shown; findings below may reference images not displayed]

FINDINGS: Enteric tube has been advanced with proximal side holes now well
positioned in the stomach. Visualized bowel gas pattern is
nonobstructive. No evidence of free intraperitoneal air.
IMPRESSION: 1. Enteric tube has been advanced and is now well positioned in the
stomach.
2. Nonobstructive bowel gas pattern.

## 2019-12-11 IMAGING — DX DG ABD PORTABLE 1V
1 series · 1 of 1 positions shown · non-contrast
Comparison: Film earlier today.

CLINICAL DATA: Gastric tube placement.

EXAM:
PORTABLE ABDOMEN - 1 VIEW

[abdomen kub]
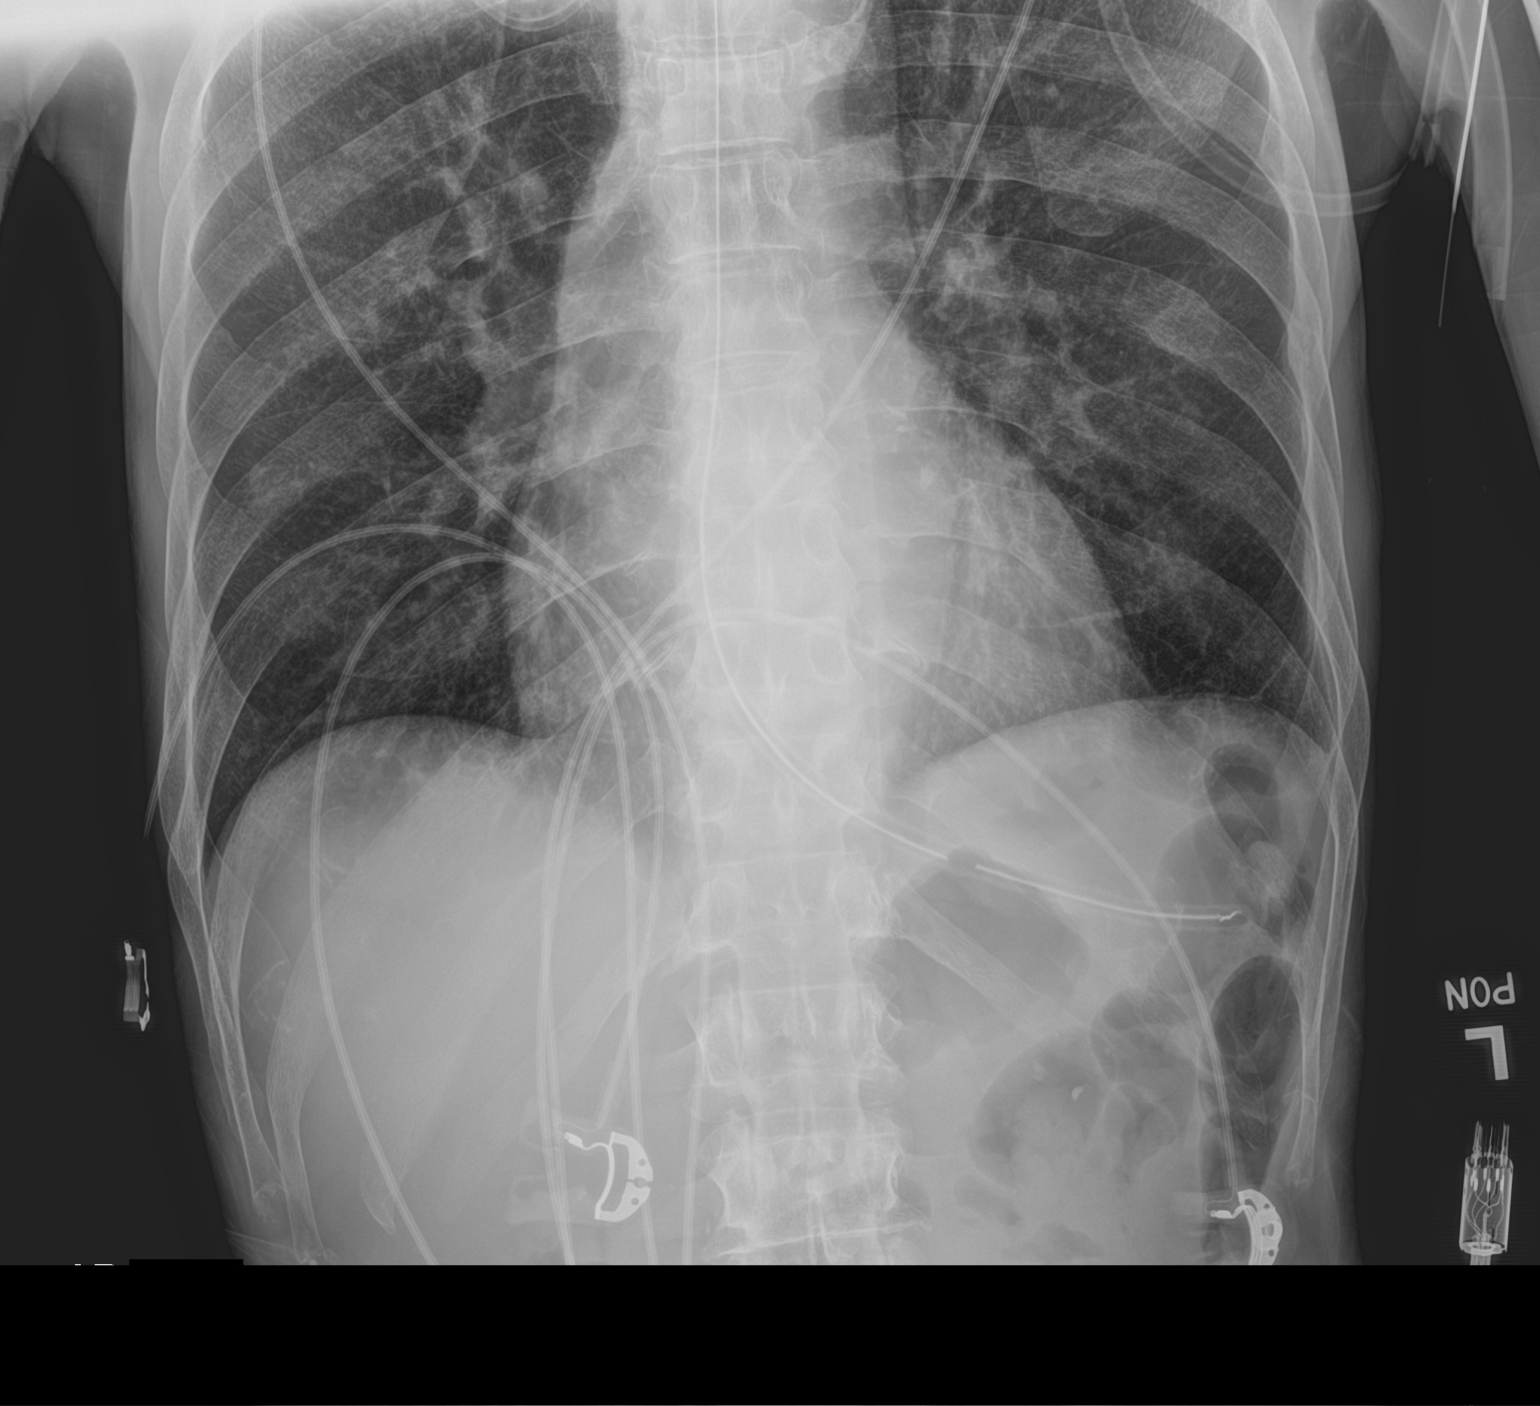

[1 of 1 positions shown; findings below may reference images not displayed]

FINDINGS: Gastric decompression tube extends into the proximal stomach with
the tip located in the fundus. Visualized bowel gas pattern is
unremarkable.
IMPRESSION: Gastric decompression tube extends into the proximal stomach with
the tip located in the fundus.

## 2019-12-11 IMAGING — DX DG ABD PORTABLE 1V
1 series · 1 of 1 positions shown · non-contrast
Comparison: Abdominal CT 07/24/2019

CLINICAL DATA: Encounter for feeding tube placement.

EXAM:
PORTABLE ABDOMEN - 1 VIEW

[abdomen kub]
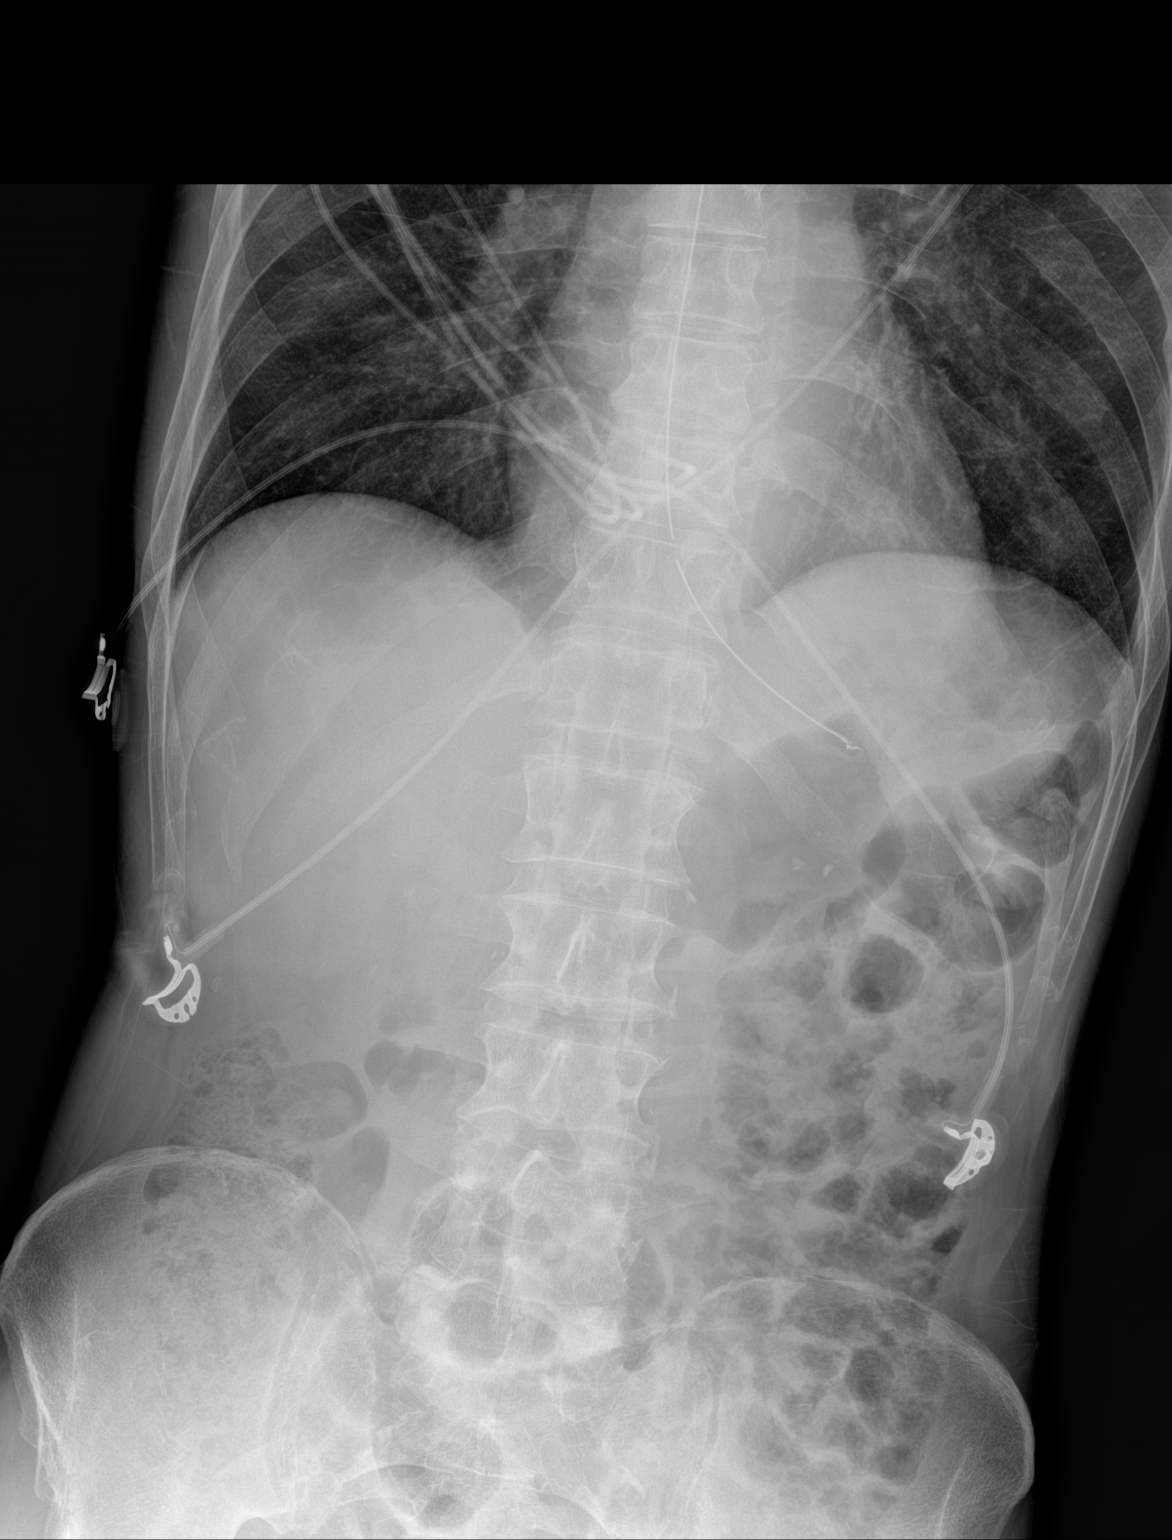

[1 of 1 positions shown; findings below may reference images not displayed]

FINDINGS: Nasogastric tube has been placed. The tip of the tube is in the left
upper abdomen and proximal stomach region. Tube side hole is near
the GE junction. Lung bases are clear. Nonobstructive bowel gas
pattern.
IMPRESSION: Nasogastric tube tip is in the proximal stomach region. Side hole of
the nasogastric tube is probably in the distal esophagus.

## 2019-12-14 IMAGING — DX DG CHEST 1V PORT
1 series · 1 of 1 positions shown · non-contrast
Comparison: CT 07/24/2019.  Chest x-ray 07/23/2019.

CLINICAL DATA: Abnormal breath sounds. History of hypertension,
COPD, asthma, HIV.

EXAM:
PORTABLE CHEST 1 VIEW

[chest ap]
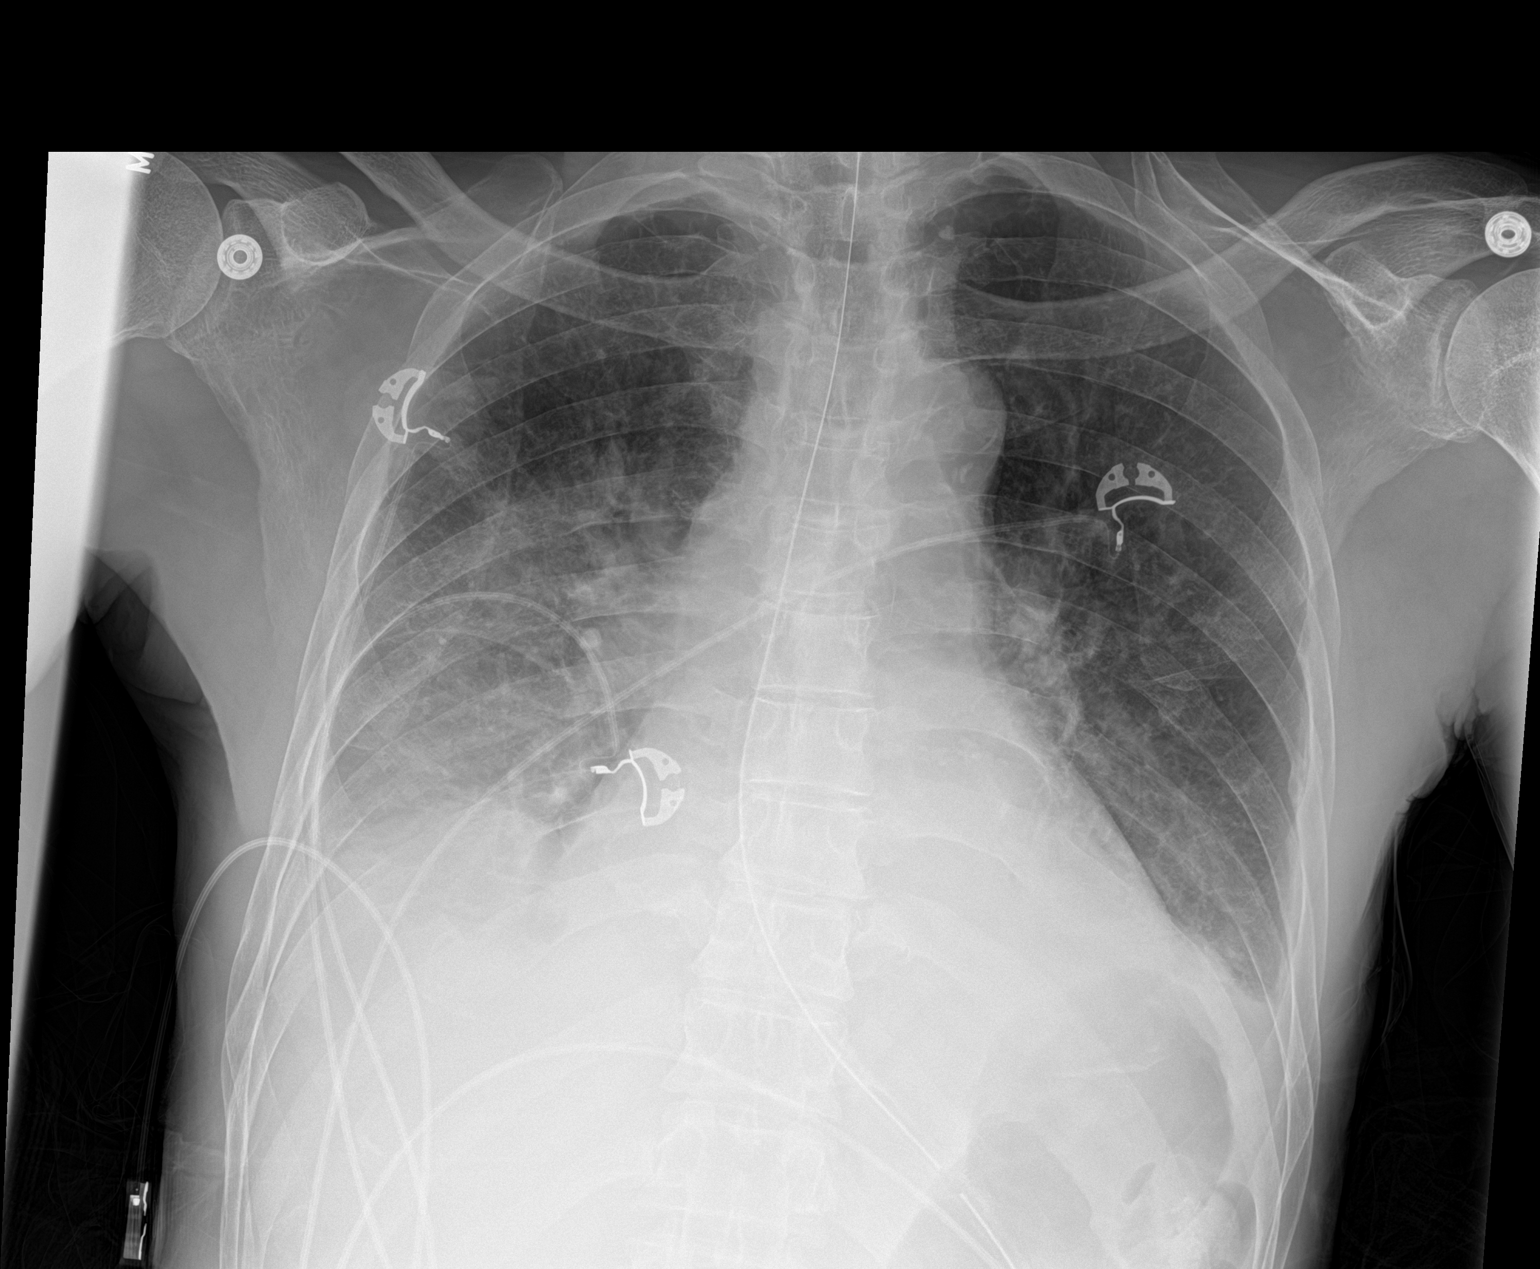

[1 of 1 positions shown; findings below may reference images not displayed]

FINDINGS: NG tube noted with tip below left hemidiaphragm. Cardiomegaly with
bilateral pulmonary infiltrates/edema and bilateral pleural
effusions. Findings most consistent with congestive heart failure.
No pneumothorax.
IMPRESSION: 1.  NG tube noted with tip below left hemidiaphragm.

2. Cardiomegaly with bilateral pulmonary infiltrates/edema bilateral
pleural effusions. Findings most consistent with congestive heart
failure.

## 2019-12-16 IMAGING — DX DG CHEST 1V PORT
1 series · 1 of 1 positions shown · non-contrast
Comparison: 07/28/2019

CLINICAL DATA: Follow-up pneumonia

EXAM:
PORTABLE CHEST 1 VIEW

[chest]
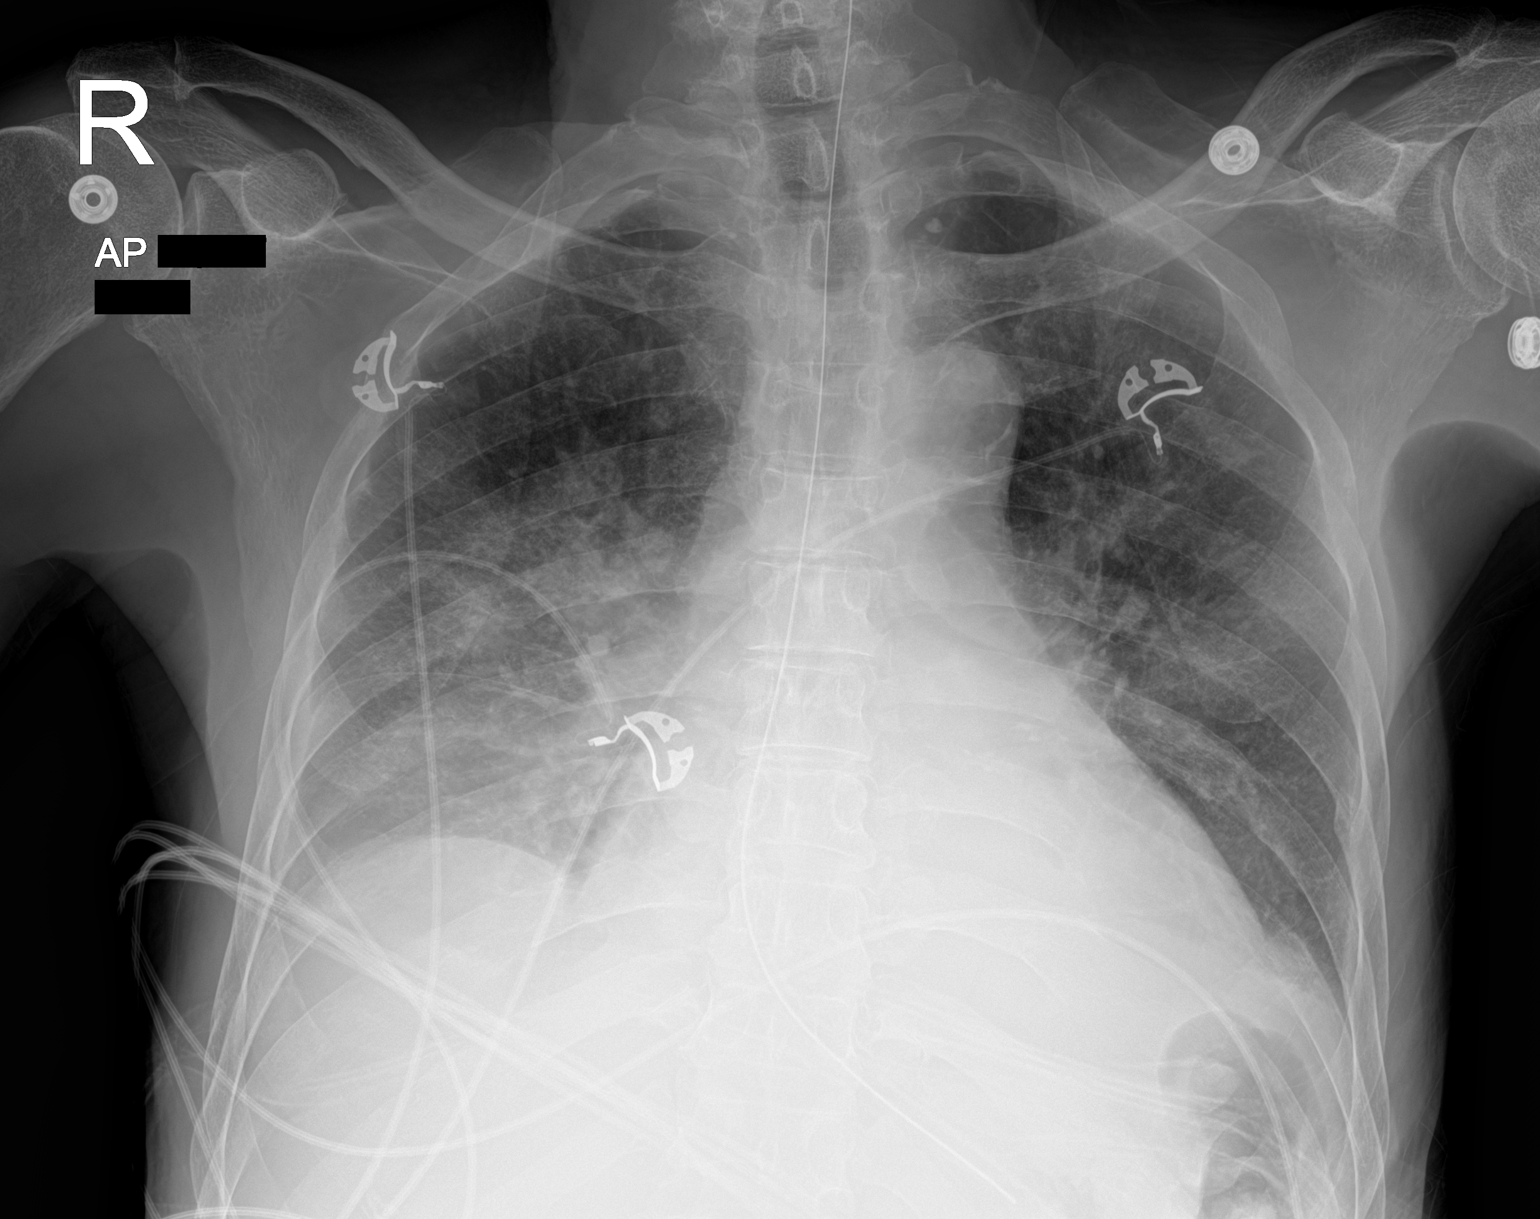

[1 of 1 positions shown; findings below may reference images not displayed]

FINDINGS: No significant interval change in bilateral heterogeneous pulmonary
opacity, most conspicuous at the right lung base, and small,
layering bilateral pleural effusions. No new airspace opacity.
Cardiomegaly. Esophagogastric tube with tip and side port below the
diaphragm.
IMPRESSION: No significant interval change in bilateral heterogeneous pulmonary
opacity, most conspicuous at the right lung base, and small,
layering bilateral pleural effusions. Findings remain consistent
with multifocal infection. No new airspace opacity.
